# Patient Record
Sex: Male | Born: 1970 | Race: White | Hispanic: No | Marital: Married | Smoking: Current every day smoker
Health system: Southern US, Community
[De-identification: ages and names within clinical notes are randomized; demographics above are authoritative.]

## PROBLEM LIST (undated history)

## (undated) DIAGNOSIS — R112 Nausea with vomiting, unspecified: Secondary | ICD-10-CM

## (undated) DIAGNOSIS — Z9889 Other specified postprocedural states: Secondary | ICD-10-CM

## (undated) DIAGNOSIS — M62838 Other muscle spasm: Secondary | ICD-10-CM

## (undated) DIAGNOSIS — R200 Anesthesia of skin: Secondary | ICD-10-CM

## (undated) DIAGNOSIS — F419 Anxiety disorder, unspecified: Secondary | ICD-10-CM

## (undated) DIAGNOSIS — E785 Hyperlipidemia, unspecified: Secondary | ICD-10-CM

## (undated) HISTORY — PX: SPINE SURGERY: SHX786

## (undated) HISTORY — PX: HERNIA REPAIR: SHX51

## (undated) HISTORY — DX: Anxiety disorder, unspecified: F41.9

## (undated) HISTORY — PX: EYE SURGERY: SHX253

---

## 2002-04-14 ENCOUNTER — Encounter: Payer: Self-pay | Admitting: Family Medicine

## 2002-04-14 ENCOUNTER — Encounter: Admission: RE | Admit: 2002-04-14 | Discharge: 2002-04-14 | Payer: Self-pay | Admitting: Family Medicine

## 2004-02-11 ENCOUNTER — Ambulatory Visit (HOSPITAL_COMMUNITY): Admission: RE | Admit: 2004-02-11 | Discharge: 2004-02-11 | Payer: Self-pay | Admitting: General Surgery

## 2010-07-30 ENCOUNTER — Emergency Department (HOSPITAL_COMMUNITY): Admission: EM | Admit: 2010-07-30 | Discharge: 2010-07-30 | Payer: Self-pay | Admitting: Emergency Medicine

## 2010-10-08 ENCOUNTER — Encounter: Payer: Self-pay | Admitting: Family Medicine

## 2011-02-02 NOTE — Op Note (Signed)
NAME:  Tyler Craig, Tyler Craig                         ACCOUNT NO.:  [id number]   MEDICAL RECORD NO.:  [id number]                   PATIENT TYPE:  AMB   LOCATION:  DAY                                  FACILITY:  [facility]   PHYSICIAN:  Leonie Man, M.D.                DATE OF BIRTH:  [birth year]   DATE OF PROCEDURE:  02/11/2004  DATE OF DISCHARGE:                                 OPERATIVE REPORT   PREOPERATIVE DIAGNOSIS:  Bilateral inguinal hernias.   POSTOPERATIVE DIAGNOSIS:  Bilateral inguinal hernias.   PROCEDURE:  Repair of bilateral inguinal hernias.   SURGEON:  Leonie Man, M.D.   ASSISTANT:  Nurse.   ANESTHESIA:  General.   Tyler Craig is a 40 year old man presenting with bilateral groin bulges  which had been present and enlarging over the previous six weeks.  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  He comes to the operating room now for repair  of his hernias after the risks and potential benefits of surgery have been  fully discussed, all alternatives have been discussed.   PROCEDURE:  Following induction of satisfactory general anesthesia with the  patient positioned supinely, the lower groin was prepped and draped to be  included in the sterile operative field.  A transverse incision is made,  first in the right lower groin, increasing through the skin and subcutaneous  tissue, dissecting down to the external oblique aponeurosis.  This is opened  up to the external inguinal ring with protection of the ilioinguinal nerve,  which is retracted laterally and inferiorly.  The spermatic cord is elevated  and held with a Penrose drain.  Dissection along the spermatic cord revealed  a large direct inguinal hernia.  There was no indirect component associated.  The cord was retracted laterally, and the defect then repaired with a light  patch of polypropylene mesh, which was sewn in at the pubic tubercle with a  2-0 Novofil suture and carried up along the conjoint tendon to the internal  ring and again from the pubic tubercle along the shelving edge of Cooper's  ligament up to the internal ring.  The mesh was split to allow normal  protrusion of the cord.  The tails of the mesh were sutured into the  internal oblique muscle behind the cord.  The resulting new internal ring  allowed the cord to come through without any constriction; however, it was  not so loose as to cause new herniation.   The layers of the sacrum were then checked with hemostasis and noted to be  dry.  The external oblique aponeurosis was then closed over the cord with a  running 2-0 Vicryl suture, thereby reconstruction the external ring.  Scarpa's fascia was then closed with a running 3-0 Vicryl suture, and the  skin closed with running 4-0 Vicryl suture.   Attention  was then turned to the left side, where a symmetrically placed  incision was made on the lower groin crease.  This was taken through the  skin and subcutaneous tissue with dissection down to the external oblique  aponeurosis.  This was opened up through the external inguinal ring, again  with the protection of the ilioinguinal nerve.  The spermatic cord was then  elevated with a Penrose drain, and again, a moderately large direct inguinal  hernia was dissected free from the cord.  Upon dissection of the spermatic  cord, two lipomas were removed, but there was no evidence of any indirect  sac.  The direct defect was then repaired with an Omni patch of  polypropylene mesh on the pubic tubercle with a 2-0 Novofil suture, carried  up along the conjoint tendon with a running 2-0 Novofil suture up to the  internal ring and again from the pubic tubercle upon along the shelving edge  of Poupart's ligament to the internal ring.  The mesh was split so as to  allow normal protrusion of the cord, and the tails of the mesh were then  sutured into the internal oblique muscle  behind the cord, thereby  reconstruction a new internal ring.  The internal ring was tested.  It was  noted to be without constriction against the cord.  All the layers of  dissection again checked for hemostasis.  Sponge, instruments, and sharp  counts were verified.  The external oblique aponeurosis was closed over the  external ring with a running suture of 2-0 Vicryl.  Scarpa's fascia was then  closed with a running 3-0 Vicryl, and the skin was closed with a running 4-0  Monocryl suture.  Both wounds were then reinforced with Steri-Strips.  Sterile dressings were applied.  The anesthetic was reversed, and the  patient was moved from the operating room to the recovery room in stable  condition.  He tolerated the procedure well.                                               Leonie Man, M.D.    PB/MEDQ  D:  02/11/2004  T:  02/12/2004  Job:  [id number]   cc:   Clydie Braun L. Hal Hope, M.D.  [address] [place]  [place] [address]  Fax: [phone]

## 2013-12-07 ENCOUNTER — Ambulatory Visit: Payer: Self-pay | Admitting: Family Medicine

## 2013-12-29 ENCOUNTER — Ambulatory Visit (INDEPENDENT_AMBULATORY_CARE_PROVIDER_SITE_OTHER): Payer: BC Managed Care – PPO | Admitting: Family Medicine

## 2013-12-29 ENCOUNTER — Encounter: Payer: Self-pay | Admitting: Family Medicine

## 2013-12-29 VITALS — BP 138/76 | HR 88 | Temp 98.1°F | Resp 18 | Ht 70.0 in | Wt 181.0 lb

## 2013-12-29 DIAGNOSIS — F411 Generalized anxiety disorder: Secondary | ICD-10-CM

## 2013-12-29 DIAGNOSIS — F329 Major depressive disorder, single episode, unspecified: Secondary | ICD-10-CM

## 2013-12-29 DIAGNOSIS — F17200 Nicotine dependence, unspecified, uncomplicated: Secondary | ICD-10-CM

## 2013-12-29 MED ORDER — LORAZEPAM 0.5 MG PO TABS: 0.5000 mg | ORAL_TABLET | Freq: Two times a day (BID) | ORAL | Status: DC | PRN

## 2013-12-29 MED ORDER — FLUOXETINE HCL 10 MG PO CAPS: 10.0000 mg | ORAL_CAPSULE | Freq: Every day | ORAL | Status: DC

## 2013-12-29 NOTE — Progress Notes (Signed)
Patient ID: Tyler Craig, male   DOB: [birth year], 43 y.o.   MRN: [id number]   Subjective:    Patient ID: Tyler GlimpseAllen D Remedios, male    DOB: [birth year], 43 y.o.   MRN: [id number]  Patient presents for anxiety/ depression  patient here for medications for anxiety and depression. She has a long-standing history of depression and anxiety diagnosed around age 43. He was very reluctant to go on medications for a few years. Looking back in her chart we have seen him on and off of the past 10 years for anxiety and depression he has been on a few medications but never really sticks with him. Chart notes that he was on Paxil at one point Effexor, Wellbutrin, Lexapro and more recently Zoloft. He actually weaned himself off of Zoloft 100 mg about 2 years ago S. he has not been seen in almost 3 years. He did try using marijuana to help with his symptoms of depression and sadness but this did not help. He also states that he has difficulty staying asleep even though he can fall asleep. He was in his church group but felt like he was too much of an anteverted was not really active therefore he stopped going. He states that his wife has been noticing changes and it is also affecting his job as he has difficulty concentrating he is fatigued because he's not sleeping and he is always feeling down on himself. He does have moments of anxiety as well but feels like he cannot express himself. He's never been hospitalized for any mood disorder. He denies any suicidal ideations    Review Of Systems:  GEN- denies fatigue, fever, weight loss,weakness, recent illness HEENT- denies eye drainage, change in vision, nasal discharge, CVS- denies chest pain, palpitations RESP- denies SOB, cough, wheeze ABD- denies N/V, change in stools, abd pain GU- denies dysuria, hematuria, dribbling, incontinence MSK- denies joint pain, muscle aches, injury Neuro- denies headache, dizziness, syncope, seizure activity       Objective:    BP  138/76  Pulse 88  Temp(Src) 98.1 F (36.7 C) (Oral)  Resp 18  Ht 5\' 10"  (1.778 m)  Wt 181 lb (82.101 kg)  BMI 25.97 kg/m2 GEN- NAD, alert and oriented x3 HEENT- PERRL, EOMI, non injected sclera, pink conjunctiva, MMM, oropharynx clear CVS- RRR, no murmur RESP-CTAB Psych- depressed affect, poor eye contact, well groomed, no SI, no hallucinations, normal thought process EXT- No edema Pulses- Radial 2+        Assessment & Plan:      Problem List Items Addressed This Visit   None      Note: This dictation was prepared with Dragon dictation along with smaller phrase technology. Any transcriptional errors that result from this process are unintentional.

## 2013-12-29 NOTE — Patient Instructions (Signed)
Start prozac once a day in the morning Take ativan as needed for stress or anxiety Return in 4 weeks

## 2013-12-29 NOTE — Assessment & Plan Note (Signed)
I will start him on Prozac 10 mg I will use lorazepam 0.5 mg as a bridge to this medication he will return in 4 weeks. We discussed side effects of the medication the

## 2013-12-29 NOTE — Assessment & Plan Note (Signed)
Per above no red flags

## 2014-01-26 ENCOUNTER — Ambulatory Visit: Payer: BC Managed Care – PPO | Admitting: Family Medicine

## 2014-02-01 ENCOUNTER — Ambulatory Visit: Payer: BC Managed Care – PPO | Admitting: Family Medicine

## 2014-02-09 ENCOUNTER — Ambulatory Visit (INDEPENDENT_AMBULATORY_CARE_PROVIDER_SITE_OTHER): Payer: BC Managed Care – PPO | Admitting: Family Medicine

## 2014-02-09 ENCOUNTER — Encounter: Payer: Self-pay | Admitting: Family Medicine

## 2014-02-09 VITALS — BP 118/68 | HR 64 | Temp 98.1°F | Resp 16 | Ht 70.0 in | Wt 183.0 lb

## 2014-02-09 DIAGNOSIS — F329 Major depressive disorder, single episode, unspecified: Secondary | ICD-10-CM

## 2014-02-09 DIAGNOSIS — M25529 Pain in unspecified elbow: Secondary | ICD-10-CM

## 2014-02-09 DIAGNOSIS — F411 Generalized anxiety disorder: Secondary | ICD-10-CM

## 2014-02-09 LAB — CBC WITH DIFFERENTIAL/PLATELET
Basophils Absolute: 0 10*3/uL (ref 0.0–0.1)
Basophils Relative: 0 % (ref 0–1)
Eosinophils Absolute: 0.1 10*3/uL (ref 0.0–0.7)
Eosinophils Relative: 1 % (ref 0–5)
HCT: 39.2 % (ref 39.0–52.0)
Hemoglobin: 13.4 g/dL (ref 13.0–17.0)
Lymphocytes Relative: 27 % (ref 12–46)
Lymphs Abs: 2.4 10*3/uL (ref 0.7–4.0)
MCH: 31.1 pg (ref 26.0–34.0)
MCHC: 34.2 g/dL (ref 30.0–36.0)
MCV: 91 fL (ref 78.0–100.0)
Monocytes Absolute: 0.6 10*3/uL (ref 0.1–1.0)
Monocytes Relative: 7 % (ref 3–12)
Neutro Abs: 5.9 10*3/uL (ref 1.7–7.7)
Neutrophils Relative %: 65 % (ref 43–77)
Platelets: 255 10*3/uL (ref 150–400)
RBC: 4.31 MIL/uL (ref 4.22–5.81)
RDW: 15.2 % (ref 11.5–15.5)
WBC: 9 10*3/uL (ref 4.0–10.5)

## 2014-02-09 MED ORDER — FLUOXETINE HCL 20 MG PO CAPS
20.0000 mg | ORAL_CAPSULE | Freq: Every day | ORAL | Status: DC
Start: 2014-02-09 — End: 2014-07-01

## 2014-02-09 NOTE — Progress Notes (Signed)
Patient ID: Tyler Craig, male   DOB: [birth year], 43 y.o.   MRN: [id number]   Subjective:    Patient ID: Tyler Craig, male    DOB: [birth year], 43 y.o.   MRN: [id number]  Patient presents for 4 month F/U  patient here to followup medications and interim. He was seen about 6 weeks ago at that time he was started on Prozac 10 mg and lorazepam 0.5 mg as needed. He states that the Prozac helps a little but he does not see a big difference. Lorazepam helps calm him down however it makes him sleepy therefore he is only taking a couple of pills. His wife does not notice a big change in his mood.  He also states that he has chronic elbow pain he was seen by an urgent care in the past and was on some anti-inflammatories for pain in both elbows states that he may have some arthritis he was supposed to go to an orthopedist but he is been holding off on this. 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    Review Of Systems:  GEN- denies fatigue, fever, weight loss,weakness, recent illness HEENT- denies eye drainage, change in vision, nasal discharge, CVS- denies chest pain, palpitations RESP- denies SOB, cough, wheeze MSK- + joint pain, muscle aches, injury Neuro- denies headache, dizziness, syncope, seizure activity       Objective:    BP 118/68  Pulse 64  Temp(Src) 98.1 F (36.7 C) (Oral)  Resp 16  Ht 5\' 10"  (1.778 m)  Wt 183 lb (83.008 kg)  BMI 26.26 kg/m2 GEN- NAD, alert and oriented x3 Psych- flat affect, not anxious appearing, normal speech, no SI, no hallcuinations, in work Public relations account executive MSK- Bilat elbow inspection normal, good ROM       Assessment & Plan:      Problem List Items Addressed This Visit   MDD (major depressive disorder) - Primary   Relevant Medications       FLUoxetine (PROZAC) capsule   Other Relevant Orders      Comprehensive metabolic panel      CBC with Differential      TSH   GAD (generalized anxiety disorder)      Note: This dictation was prepared with Dragon dictation along with smaller phrase technology. Any transcriptional errors that result from this process are unintentional.

## 2014-02-09 NOTE — Patient Instructions (Signed)
Try the biofreeze for the elbow/ or Aspercreme for joints Increase prozac to 20mg  once a day  You can take 1/2 tablet of the ativan if needed F/U 2 months

## 2014-02-09 NOTE — Assessment & Plan Note (Signed)
Increase Prozac to 20 mg we also discussed that he can take half a tablet of the lorazepam if needed to decrease the somnolent affect Followup in 8 weeks

## 2014-02-09 NOTE — Assessment & Plan Note (Signed)
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 Advised him that he can try  Biofreeze or Aspercreme

## 2014-02-10 LAB — COMPREHENSIVE METABOLIC PANEL
ALT: 23 U/L (ref 0–53)
AST: 15 U/L (ref 0–37)
Albumin: 4.4 g/dL (ref 3.5–5.2)
Alkaline Phosphatase: 58 U/L (ref 39–117)
BUN: 11 mg/dL (ref 6–23)
CO2: 27 mEq/L (ref 19–32)
Calcium: 9.5 mg/dL (ref 8.4–10.5)
Chloride: 103 mEq/L (ref 96–112)
Creat: 0.94 mg/dL (ref 0.50–1.35)
Glucose, Bld: 75 mg/dL (ref 70–99)
Potassium: 4.6 mEq/L (ref 3.5–5.3)
Sodium: 139 mEq/L (ref 135–145)
Total Bilirubin: 0.2 mg/dL (ref 0.2–1.2)
Total Protein: 6.3 g/dL (ref 6.0–8.3)

## 2014-02-10 LAB — TSH: TSH: 0.975 u[IU]/mL (ref 0.350–4.500)

## 2014-04-07 ENCOUNTER — Ambulatory Visit: Payer: BC Managed Care – PPO | Admitting: Family Medicine

## 2014-07-01 ENCOUNTER — Other Ambulatory Visit: Payer: Self-pay | Admitting: *Deleted

## 2014-07-01 MED ORDER — FLUOXETINE HCL 20 MG PO CAPS: 20.0000 mg | ORAL_CAPSULE | Freq: Every day | ORAL | Status: DC

## 2014-07-01 NOTE — Telephone Encounter (Signed)
Received fax requesting refill on Prozac.   Refill appropriate and filled per protocol. 

## 2014-07-05 ENCOUNTER — Ambulatory Visit (INDEPENDENT_AMBULATORY_CARE_PROVIDER_SITE_OTHER): Payer: BC Managed Care – PPO | Admitting: Family Medicine

## 2014-07-05 ENCOUNTER — Encounter: Payer: Self-pay | Admitting: Family Medicine

## 2014-07-05 VITALS — BP 118/62 | HR 72 | Temp 98.6°F | Resp 14 | Ht 69.0 in | Wt 185.0 lb

## 2014-07-05 DIAGNOSIS — F411 Generalized anxiety disorder: Secondary | ICD-10-CM

## 2014-07-05 DIAGNOSIS — G4700 Insomnia, unspecified: Secondary | ICD-10-CM

## 2014-07-05 DIAGNOSIS — F331 Major depressive disorder, recurrent, moderate: Secondary | ICD-10-CM

## 2014-07-05 DIAGNOSIS — Z23 Encounter for immunization: Secondary | ICD-10-CM

## 2014-07-05 MED ORDER — FLUOXETINE HCL 40 MG PO CAPS: 40.0000 mg | ORAL_CAPSULE | Freq: Every day | ORAL | Status: DC

## 2014-07-05 MED ORDER — LORAZEPAM 0.5 MG PO TABS: 0.5000 mg | ORAL_TABLET | Freq: Two times a day (BID) | ORAL | Status: DC | PRN

## 2014-07-05 NOTE — Assessment & Plan Note (Signed)
prozac increased to 40mg  Regarding sleep, we will use ativan at bedtime as needed, instead of adding new medication I think this is worse in setting of father recent diagnosis and treatments No red flags on exam

## 2014-07-05 NOTE — Progress Notes (Signed)
Patient ID: Tyler Craig, male   DOB: 1971/04/02, 43 y.o.   MRN: [id number]   Subjective:    Patient ID: Tyler Craig, male    DOB: 1971/04/02, 43 y.o.   MRN: [id number]  Patient presents for Medication Refill  patient here for medication refills. He is history of major depressive disorder as well as anxiety he's also had some worsening insomnia recently as his father is going to treatments for what sounds like colon cancer. He thinks that the Prozac is helping he is at 20 mg but he continues to have some days with a lot of stress and anxiety his wife does notice a difference with his medications. The only side effect he notices that his sex drive has decreased but he does not want to stop the medication. He thinks that he'll benefit from it being a little bit stronger. He does not take the lorazepam at this time.    Review Of Systems:  GEN- denies fatigue, fever, weight loss,weakness, recent illness HEENT- denies eye drainage, change in vision, nasal discharge, CVS- denies chest pain, palpitations RESP- denies SOB, cough, wheeze ABD- denies N/V, change in stools, abd pain GU- denies dysuria, hematuria, dribbling, incontinence MSK- denies joint pain, muscle aches, injury Neuro- denies headache, dizziness, syncope, seizure activity       Objective:    BP 118/62  Pulse 72  Temp(Src) 98.6 F (37 C) (Oral)  Resp 14  Ht 5\' 9"  (1.753 m)  Wt 185 lb (83.915 kg)  BMI 27.31 kg/m2 GEN- NAD, alert and oriented x3 Psych- normal affect and mood, no hallucinations, well groomed, no SI        Assessment & Plan:      Problem List Items Addressed This Visit   MDD (major depressive disorder)   Relevant Medications      FLUoxetine (PROZAC) capsule      LORazepam (ATIVAN) tablet   Insomnia   GAD (generalized anxiety disorder)    Other Visit Diagnoses   Need for prophylactic vaccination and inoculation against influenza    -  Primary    Relevant Orders       Flu Vaccine QUAD 36+  mos PF IM (Fluarix Quad PF) (Completed)       Note: This dictation was prepared with Dragon dictation along with smaller phrase technology. Any transcriptional errors that result from this process are unintentional.

## 2014-07-05 NOTE — Patient Instructions (Addendum)
Prozac increased to 40mg   Take ativan at bedtime as needed  Flu shot given F/U 3 months- PHYSICAL

## 2014-08-11 ENCOUNTER — Encounter: Payer: Self-pay | Admitting: Family Medicine

## 2014-10-05 ENCOUNTER — Encounter: Payer: BC Managed Care – PPO | Admitting: Family Medicine

## 2014-10-12 ENCOUNTER — Encounter: Payer: BC Managed Care – PPO | Admitting: Family Medicine

## 2014-10-27 ENCOUNTER — Other Ambulatory Visit: Payer: BLUE CROSS/BLUE SHIELD

## 2014-10-27 DIAGNOSIS — Z79899 Other long term (current) drug therapy: Secondary | ICD-10-CM

## 2014-10-27 DIAGNOSIS — Z0000 Encounter for general adult medical examination without abnormal findings: Secondary | ICD-10-CM

## 2014-10-27 DIAGNOSIS — F320 Major depressive disorder, single episode, mild: Secondary | ICD-10-CM

## 2014-10-27 DIAGNOSIS — F411 Generalized anxiety disorder: Secondary | ICD-10-CM

## 2014-10-27 LAB — CBC WITH DIFFERENTIAL/PLATELET
Basophils Absolute: 0 10*3/uL (ref 0.0–0.1)
Basophils Relative: 0 % (ref 0–1)
Eosinophils Absolute: 0.2 10*3/uL (ref 0.0–0.7)
Eosinophils Relative: 3 % (ref 0–5)
HCT: 43 % (ref 39.0–52.0)
HEMOGLOBIN: 14.6 g/dL (ref 13.0–17.0)
LYMPHS PCT: 25 % (ref 12–46)
Lymphs Abs: 1.6 10*3/uL (ref 0.7–4.0)
MCH: 31.3 pg (ref 26.0–34.0)
MCHC: 34 g/dL (ref 30.0–36.0)
MCV: 92.1 fL (ref 78.0–100.0)
MPV: 10.1 fL (ref 8.6–12.4)
Monocytes Absolute: 0.5 10*3/uL (ref 0.1–1.0)
Monocytes Relative: 8 % (ref 3–12)
NEUTROS ABS: 4 10*3/uL (ref 1.7–7.7)
Neutrophils Relative %: 64 % (ref 43–77)
PLATELETS: 285 10*3/uL (ref 150–400)
RBC: 4.67 MIL/uL (ref 4.22–5.81)
RDW: 15 % (ref 11.5–15.5)
WBC: 6.3 10*3/uL (ref 4.0–10.5)

## 2014-10-27 LAB — LIPID PANEL
Cholesterol: 236 mg/dL — ABNORMAL HIGH (ref 0–200)
HDL: 39 mg/dL — AB (ref >39)
LDL CALC: 174 mg/dL — AB (ref 0–99)
Total CHOL/HDL Ratio: 6.1 Ratio
Triglycerides: 117 mg/dL (ref <150)
VLDL: 23 mg/dL (ref 0–40)

## 2014-10-27 LAB — COMPLETE METABOLIC PANEL WITH GFR
ALBUMIN: 4 g/dL (ref 3.5–5.2)
ALT: 13 U/L (ref 0–53)
AST: 12 U/L (ref 0–37)
Alkaline Phosphatase: 60 U/L (ref 39–117)
BUN: 11 mg/dL (ref 6–23)
CALCIUM: 9.4 mg/dL (ref 8.4–10.5)
CO2: 25 mEq/L (ref 19–32)
Chloride: 105 mEq/L (ref 96–112)
Creat: 0.9 mg/dL (ref 0.50–1.35)
Glucose, Bld: 98 mg/dL (ref 70–99)
POTASSIUM: 4.7 meq/L (ref 3.5–5.3)
SODIUM: 140 meq/L (ref 135–145)
TOTAL PROTEIN: 6.1 g/dL (ref 6.0–8.3)
Total Bilirubin: 0.4 mg/dL (ref 0.2–1.2)

## 2014-10-27 LAB — TSH: TSH: 0.409 u[IU]/mL (ref 0.350–4.500)

## 2014-11-01 ENCOUNTER — Encounter: Payer: Self-pay | Admitting: Family Medicine

## 2014-11-16 ENCOUNTER — Encounter: Payer: Self-pay | Admitting: Family Medicine

## 2014-11-16 ENCOUNTER — Ambulatory Visit (INDEPENDENT_AMBULATORY_CARE_PROVIDER_SITE_OTHER): Payer: BLUE CROSS/BLUE SHIELD | Admitting: Family Medicine

## 2014-11-16 VITALS — BP 136/70 | HR 78 | Temp 98.8°F | Resp 14 | Ht 69.0 in | Wt 178.0 lb

## 2014-11-16 DIAGNOSIS — Z0000 Encounter for general adult medical examination without abnormal findings: Secondary | ICD-10-CM | POA: Diagnosis not present

## 2014-11-16 DIAGNOSIS — F331 Major depressive disorder, recurrent, moderate: Secondary | ICD-10-CM

## 2014-11-16 DIAGNOSIS — Z23 Encounter for immunization: Secondary | ICD-10-CM | POA: Diagnosis not present

## 2014-11-16 DIAGNOSIS — Z720 Tobacco use: Secondary | ICD-10-CM | POA: Diagnosis not present

## 2014-11-16 DIAGNOSIS — F411 Generalized anxiety disorder: Secondary | ICD-10-CM | POA: Diagnosis not present

## 2014-11-16 DIAGNOSIS — E785 Hyperlipidemia, unspecified: Secondary | ICD-10-CM | POA: Insufficient documentation

## 2014-11-16 DIAGNOSIS — F17200 Nicotine dependence, unspecified, uncomplicated: Secondary | ICD-10-CM

## 2014-11-16 MED ORDER — LORAZEPAM 0.5 MG PO TABS: 0.5000 mg | ORAL_TABLET | Freq: Two times a day (BID) | ORAL | Status: DC | PRN

## 2014-11-16 MED ORDER — FLUOXETINE HCL 40 MG PO CAPS: 40.0000 mg | ORAL_CAPSULE | Freq: Every day | ORAL | Status: DC

## 2014-11-16 NOTE — Assessment & Plan Note (Signed)
Continue current meds, stable at current dose

## 2014-11-16 NOTE — Progress Notes (Signed)
Patient ID: Adela Glimpsellen D Blackerby, male   DOB: 07/07/1971, 44 y.o.   MRN: [id number]   Subjective:    Patient ID: Adela GlimpseAllen D Panos, male    DOB: 07/07/1971, 44 y.o.   MRN: [id number]  Patient presents for CPE  patient here for complete physical exam. He has no specific concerns. He is taking his medication as prescribed. His fasting labs were reviewed and were remarkable for elevated cholesterol. There is some heart disease in his family. He is due for tetanus booster.    Review Of Systems:  GEN- denies fatigue, fever, weight loss,weakness, recent illness HEENT- denies eye drainage, change in vision, nasal discharge, CVS- denies chest pain, palpitations RESP- denies SOB, cough, wheeze ABD- denies N/V, change in stools, abd pain GU- denies dysuria, hematuria, dribbling, incontinence MSK- denies joint pain, muscle aches, injury Neuro- denies headache, dizziness, syncope, seizure activity       Objective:    BP 136/70 mmHg  Pulse 78  Temp(Src) 98.8 F (37.1 C) (Oral)  Resp 14  Ht 5\' 9"  (1.753 m)  Wt 178 lb (80.74 kg)  BMI 26.27 kg/m2 GEN- NAD, alert and oriented x3 HEENT- PERRL, EOMI, non injected sclera, pink conjunctiva, MMM, oropharynx clear, TM clear bilat, canals clear Neck- Supple, no LAD CVS- RRR, no murmur RESP-CTAB ABD-NABS,soft,NT,ND Psych- normal affect and mood EXT- No edema Pulses- Radial, DP- 2+        Assessment & Plan:      Problem List Items Addressed This Visit    None    Visit Diagnoses    Routine general medical examination at a health care facility    -  Primary    CPE Done, discussed cholesterol and dietary changes recheck in 12 weeks before starting meds, TDAP given    Need for prophylactic vaccination with combined diphtheria-tetanus-pertussis (DTP) vaccine        Relevant Orders    Tdap vaccine greater than or equal to 7yo IM (Completed)       Note: This dictation was prepared with Dragon dictation along with smaller phrase technology. Any  transcriptional errors that result from this process are unintentional.

## 2014-11-16 NOTE — Patient Instructions (Signed)
Work on low cholesterol diet  Come in 3 months for cholesterol  Tetanus Booster F/U 6 months for visit

## 2015-01-07 ENCOUNTER — Encounter: Payer: Self-pay | Admitting: Family Medicine

## 2015-01-07 ENCOUNTER — Ambulatory Visit (INDEPENDENT_AMBULATORY_CARE_PROVIDER_SITE_OTHER): Payer: BLUE CROSS/BLUE SHIELD | Admitting: Family Medicine

## 2015-01-07 VITALS — BP 110/62 | HR 78 | Temp 98.4°F | Resp 14 | Ht 69.0 in | Wt 176.0 lb

## 2015-01-07 DIAGNOSIS — S335XXA Sprain of ligaments of lumbar spine, initial encounter: Secondary | ICD-10-CM

## 2015-01-07 MED ORDER — CYCLOBENZAPRINE HCL 10 MG PO TABS: 10.0000 mg | ORAL_TABLET | Freq: Three times a day (TID) | ORAL | Status: DC | PRN

## 2015-01-07 MED ORDER — METHYLPREDNISOLONE 4 MG PO TBPK: ORAL_TABLET | ORAL | Status: DC

## 2015-01-07 MED ORDER — HYDROCODONE-ACETAMINOPHEN 5-325 MG PO TABS: 1.0000 | ORAL_TABLET | Freq: Four times a day (QID) | ORAL | Status: DC | PRN

## 2015-01-07 MED ORDER — METHYLPREDNISOLONE ACETATE 80 MG/ML IJ SUSP
80.0000 mg | Freq: Once | INTRAMUSCULAR | Status: AC
  Administered 2015-01-07: 80 mg via INTRAMUSCULAR

## 2015-01-07 NOTE — Patient Instructions (Signed)
Steroid shot given Start depomedrol package tomorrow for inflammation Do not take any over the counter anti-inflammatories with this Muscle relaxer given Pain medication for bedtime Call if not improved F/U as previous

## 2015-01-09 NOTE — Progress Notes (Signed)
Patient ID: Tyler Craig, male   DOB: Aug 30, 1971, 44 y.o.   MRN: [id number]   Subjective:    Patient ID: Tyler Craig, male    DOB: Aug 30, 1971, 44 y.o.   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 He finished his shift but the next day he was very stiff with pain throughout his lower back most in center would radiate around both sides of his back but not down his legs, no change in bowel or bladder. Denies tingling or numbness in lower ext He has taken BC powders and DOMS back pain relief    Review Of Systems:  GEN- denies fatigue, fever, weight loss,weakness, recent illness HEENT- denies eye drainage, change in vision, nasal discharge, CVS- denies chest pain, palpitations RESP- denies SOB, cough, wheeze ABD- denies N/V, change in stools, abd pain GU- denies dysuria, hematuria, dribbling, incontinence MSK-+ joint pain, muscle aches, injury Neuro- denies headache, dizziness, syncope, seizure activity       Objective:    BP 110/62 mmHg  Pulse 78  Temp(Src) 98.4 F (36.9 C) (Oral)  Resp 14  Ht 5\' 9"  (1.753 m)  Wt 176 lb (79.833 kg)  BMI 25.98 kg/m2 GEN- NAD, alert and oriented x3 Neck- Supple, FROM MSK- TTP bilat lumbar region, neg SLR, fair ROM spine, +spasm, good ROM HIPS, KNEES Neuro- Strength equal bilat LE, sensation in tact, DTR symmetric, antalgic gait EXT- No edema         Assessment & Plan:      Problem List Items Addressed This Visit    None    Visit Diagnoses    Lumbar back sprain, initial encounter    -  Primary    Treat at MSK strain based on mechanism of injury, muscle relaxer, medrol dose pak, hydrocodone, heat     Relevant Medications    methylPREDNISolone acetate (DEPO-MEDROL) injection 80 mg (Completed)       Note: This dictation was  prepared with Dragon dictation along with smaller phrase technology. Any transcriptional errors that result from this process are unintentional.

## 2015-05-20 ENCOUNTER — Ambulatory Visit: Payer: BLUE CROSS/BLUE SHIELD | Admitting: Family Medicine

## 2015-06-01 ENCOUNTER — Ambulatory Visit (INDEPENDENT_AMBULATORY_CARE_PROVIDER_SITE_OTHER): Payer: BLUE CROSS/BLUE SHIELD | Admitting: Family Medicine

## 2015-06-01 ENCOUNTER — Encounter: Payer: Self-pay | Admitting: Family Medicine

## 2015-06-01 VITALS — BP 116/60 | HR 64 | Temp 98.4°F | Resp 16 | Ht 69.0 in | Wt 181.0 lb

## 2015-06-01 DIAGNOSIS — Z23 Encounter for immunization: Secondary | ICD-10-CM | POA: Diagnosis not present

## 2015-06-01 DIAGNOSIS — R5382 Chronic fatigue, unspecified: Secondary | ICD-10-CM | POA: Diagnosis not present

## 2015-06-01 DIAGNOSIS — Z720 Tobacco use: Secondary | ICD-10-CM | POA: Diagnosis not present

## 2015-06-01 DIAGNOSIS — F331 Major depressive disorder, recurrent, moderate: Secondary | ICD-10-CM | POA: Diagnosis not present

## 2015-06-01 DIAGNOSIS — F411 Generalized anxiety disorder: Secondary | ICD-10-CM | POA: Diagnosis not present

## 2015-06-01 DIAGNOSIS — E785 Hyperlipidemia, unspecified: Secondary | ICD-10-CM

## 2015-06-01 DIAGNOSIS — F17200 Nicotine dependence, unspecified, uncomplicated: Secondary | ICD-10-CM

## 2015-06-01 NOTE — Assessment & Plan Note (Signed)
He will return for fasting labs in the morning

## 2015-06-01 NOTE — Assessment & Plan Note (Signed)
If his labs are normal I plan to add Wellbutrin extended release to his Prozac in the morning to see if this helps with his mood and his energy level. I discussed this with him.

## 2015-06-01 NOTE — Progress Notes (Signed)
Patient ID: Tyler Craig, male   DOB: Jun 25, 1971, 44 y.o.   MRN: [id number]   Subjective:    Patient ID: Tyler Craig, male    DOB: 08-03-1971, 44 y.o.   MRN: [id number]  Patient presents for 6 month F/U and Fatigue    Pt here to f/u medications, he states he is doing well on the prozac but still has some low days where he feels depressed. He also continues to have problems with sleep. He has not used the ativan in months. He is always tired and his wife is concerned about this. He denies any ED. Normal TSH , cmet, cbc in past.  Would like to be checked for other deficiencies that may cause fatigue       Review Of Systems:  GEN- +fatigue, fever, weight loss,weakness, recent illness HEENT- denies eye drainage, change in vision, nasal discharge, CVS- denies chest pain, palpitations RESP- denies SOB, cough, wheeze ABD- denies N/V, change in stools, abd pain GU- denies dysuria, hematuria, dribbling, incontinence MSK- denies joint pain, muscle aches, injury Neuro- denies headache, dizziness, syncope, seizure activity       Objective:    BP 116/60 mmHg  Pulse 64  Temp(Src) 98.4 F (36.9 C) (Oral)  Resp 16  Ht  (1.753 m)  Wt 181 lb (82.101 kg)  BMI 26.72 kg/m2 GEN- NAD, alert and oriented x3 HEENT- PERRL, EOMI, non injected sclera, pink conjunctiva, MMM, oropharynx clear Neck- Supple, no thyromegaly CVS- RRR, no murmur RESP-CTAB Psych- quiet, normal affect and mood, good eye contact, well groomed  Pulses- Radial 2+        Assessment & Plan:      Problem List Items Addressed This Visit    Smoker   MDD (major depressive disorder)    If his labs are normal I plan to add Wellbutrin extended release to his Prozac in the morning to see if this helps with his mood and his energy level. I discussed this with him.      Hyperlipidemia    He will return for fasting labs in the morning      GAD (generalized anxiety disorder)    Other Visit Diagnoses    Need for  prophylactic vaccination and inoculation against influenza    -  Primary    Relevant Orders    Flu Vaccine QUAD 36+ mos PF IM (Fluarix & Fluzone Quad PF) (Completed)    Chronic fatigue        Most likley related to his depression and anxiety, will check B12, Vitamin D, testosterone with his labs,        Note: This dictation was prepared with Dragon dictation along with smaller phrase technology. Any transcriptional errors that result from this process are unintentional.

## 2015-06-01 NOTE — Patient Instructions (Signed)
COntinue the prozac Return for labs in the morning fasting We will consider Wellbutrin in the morning  F/U 6 months for physical

## 2015-06-02 ENCOUNTER — Other Ambulatory Visit: Payer: BLUE CROSS/BLUE SHIELD

## 2015-06-02 ENCOUNTER — Other Ambulatory Visit: Payer: Self-pay | Admitting: Family Medicine

## 2015-06-02 DIAGNOSIS — E785 Hyperlipidemia, unspecified: Secondary | ICD-10-CM

## 2015-06-02 DIAGNOSIS — R5382 Chronic fatigue, unspecified: Secondary | ICD-10-CM

## 2015-06-02 LAB — CBC WITH DIFFERENTIAL/PLATELET
BASOS ABS: 0 10*3/uL (ref 0.0–0.1)
BASOS PCT: 0 % (ref 0–1)
EOS ABS: 0.2 10*3/uL (ref 0.0–0.7)
Eosinophils Relative: 2 % (ref 0–5)
HCT: 42 % (ref 39.0–52.0)
Hemoglobin: 14 g/dL (ref 13.0–17.0)
Lymphocytes Relative: 24 % (ref 12–46)
Lymphs Abs: 1.9 10*3/uL (ref 0.7–4.0)
MCH: 31.7 pg (ref 26.0–34.0)
MCHC: 33.3 g/dL (ref 30.0–36.0)
MCV: 95 fL (ref 78.0–100.0)
MPV: 9.7 fL (ref 8.6–12.4)
Monocytes Absolute: 0.5 10*3/uL (ref 0.1–1.0)
Monocytes Relative: 6 % (ref 3–12)
NEUTROS PCT: 68 % (ref 43–77)
Neutro Abs: 5.4 10*3/uL (ref 1.7–7.7)
Platelets: 332 10*3/uL (ref 150–400)
RBC: 4.42 MIL/uL (ref 4.22–5.81)
RDW: 14.6 % (ref 11.5–15.5)
WBC: 8 10*3/uL (ref 4.0–10.5)

## 2015-06-02 LAB — COMPREHENSIVE METABOLIC PANEL
ALK PHOS: 68 U/L (ref 40–115)
ALT: 15 U/L (ref 9–46)
AST: 12 U/L (ref 10–40)
Albumin: 4 g/dL (ref 3.6–5.1)
BILIRUBIN TOTAL: 0.3 mg/dL (ref 0.2–1.2)
BUN: 14 mg/dL (ref 7–25)
CO2: 24 mmol/L (ref 20–31)
CREATININE: 0.85 mg/dL (ref 0.60–1.35)
Calcium: 9.3 mg/dL (ref 8.6–10.3)
Chloride: 106 mmol/L (ref 98–110)
GLUCOSE: 130 mg/dL — AB (ref 70–99)
POTASSIUM: 5 mmol/L (ref 3.5–5.3)
SODIUM: 141 mmol/L (ref 135–146)
TOTAL PROTEIN: 6.4 g/dL (ref 6.1–8.1)

## 2015-06-02 LAB — LIPID PANEL
CHOL/HDL RATIO: 7.8 ratio — AB (ref <=5.0)
Cholesterol: 257 mg/dL — ABNORMAL HIGH (ref 125–200)
HDL: 33 mg/dL — ABNORMAL LOW (ref >=40)
LDL Cholesterol: 204 mg/dL — ABNORMAL HIGH (ref <130)
Triglycerides: 100 mg/dL (ref <150)
VLDL: 20 mg/dL (ref <30)

## 2015-06-03 LAB — TESTOSTERONE: TESTOSTERONE: 312 ng/dL (ref 300–890)

## 2015-06-03 LAB — HEMOGLOBIN A1C
HEMOGLOBIN A1C: 5.9 % — AB (ref <5.7)
MEAN PLASMA GLUCOSE: 123 mg/dL — AB (ref <117)

## 2015-06-03 LAB — VITAMIN B12: VITAMIN B 12: 842 pg/mL (ref 211–911)

## 2015-06-03 LAB — VITAMIN D 25 HYDROXY (VIT D DEFICIENCY, FRACTURES): VIT D 25 HYDROXY: 28 ng/mL — AB (ref 30–100)

## 2015-06-07 ENCOUNTER — Other Ambulatory Visit: Payer: Self-pay | Admitting: *Deleted

## 2015-06-07 MED ORDER — BUPROPION HCL ER (XL) 150 MG PO TB24: 150.0000 mg | ORAL_TABLET | Freq: Every day | ORAL | Status: DC

## 2015-06-07 MED ORDER — ATORVASTATIN CALCIUM 20 MG PO TABS: 20.0000 mg | ORAL_TABLET | Freq: Every day | ORAL | Status: DC

## 2015-08-04 ENCOUNTER — Other Ambulatory Visit: Payer: Self-pay | Admitting: *Deleted

## 2015-08-04 MED ORDER — FLUOXETINE HCL 40 MG PO CAPS: 40.0000 mg | ORAL_CAPSULE | Freq: Every day | ORAL | Status: DC

## 2015-08-04 NOTE — Telephone Encounter (Signed)
Received fax requesting refill on Prozac.   Refill appropriate and filled per protocol. 

## 2015-12-20 ENCOUNTER — Encounter: Payer: Self-pay | Admitting: Family Medicine

## 2015-12-20 ENCOUNTER — Ambulatory Visit (INDEPENDENT_AMBULATORY_CARE_PROVIDER_SITE_OTHER): Payer: BLUE CROSS/BLUE SHIELD | Admitting: Family Medicine

## 2015-12-20 VITALS — BP 134/76 | HR 82 | Temp 98.9°F | Resp 16 | Ht 69.0 in | Wt 180.0 lb

## 2015-12-20 DIAGNOSIS — M5010 Cervical disc disorder with radiculopathy, unspecified cervical region: Secondary | ICD-10-CM

## 2015-12-20 DIAGNOSIS — E785 Hyperlipidemia, unspecified: Secondary | ICD-10-CM

## 2015-12-20 DIAGNOSIS — M75101 Unspecified rotator cuff tear or rupture of right shoulder, not specified as traumatic: Secondary | ICD-10-CM | POA: Diagnosis not present

## 2015-12-20 LAB — COMPREHENSIVE METABOLIC PANEL
ALK PHOS: 64 U/L (ref 40–115)
ALT: 18 U/L (ref 9–46)
AST: 15 U/L (ref 10–40)
Albumin: 4.5 g/dL (ref 3.6–5.1)
BILIRUBIN TOTAL: 0.4 mg/dL (ref 0.2–1.2)
BUN: 13 mg/dL (ref 7–25)
CALCIUM: 9.4 mg/dL (ref 8.6–10.3)
CO2: 24 mmol/L (ref 20–31)
Chloride: 104 mmol/L (ref 98–110)
Creat: 0.86 mg/dL (ref 0.60–1.35)
GLUCOSE: 81 mg/dL (ref 70–99)
Potassium: 4.3 mmol/L (ref 3.5–5.3)
Sodium: 139 mmol/L (ref 135–146)
Total Protein: 6.8 g/dL (ref 6.1–8.1)

## 2015-12-20 LAB — LIPID PANEL
Cholesterol: 197 mg/dL (ref 125–200)
HDL: 43 mg/dL (ref >=40)
LDL Cholesterol: 123 mg/dL (ref <130)
Total CHOL/HDL Ratio: 4.6 Ratio (ref <=5.0)
Triglycerides: 157 mg/dL — ABNORMAL HIGH (ref <150)
VLDL: 31 mg/dL — ABNORMAL HIGH (ref <30)

## 2015-12-20 MED ORDER — OXYCODONE-ACETAMINOPHEN 5-325 MG PO TABS: 1.0000 | ORAL_TABLET | ORAL | Status: DC | PRN

## 2015-12-20 MED ORDER — CYCLOBENZAPRINE HCL 10 MG PO TABS: 10.0000 mg | ORAL_TABLET | Freq: Three times a day (TID) | ORAL | Status: DC | PRN

## 2015-12-20 MED ORDER — METHYLPREDNISOLONE ACETATE 80 MG/ML IJ SUSP
80.0000 mg | Freq: Once | INTRAMUSCULAR | Status: AC
  Administered 2015-12-20: 80 mg via INTRAMUSCULAR

## 2015-12-20 MED ORDER — METHYLPREDNISOLONE 4 MG PO TBPK: ORAL_TABLET | ORAL | Status: DC

## 2015-12-20 NOTE — Progress Notes (Signed)
Patient ID: Tyler Craig, male   DOB: 05-23-1971, 45 y.o.   MRN: [id number]   Subjective:    Patient ID: Tyler Craig, male    DOB: 05-23-1971, 45 y.o.   MRN: [id number]  Patient presents for Back Pain Patient here for neck pain he also has chronic back pain. He was actually reaching above head twisting off football with a ranch about a week ago when he did have significant pain afterwards. He had numbness and tingling was running down his left arm into his fingers. He still has numbness in his second through fourth digits. He also feels a little weak on that side. He is chronically right shoulder pain which he states he is now getting pains that come from his neck as well. He has history of remote cervical surgery where he had a plate placed to fix some disc. 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 He also continues to have the chronic back pain he was seen for a few months ago on and off. This area did help with the inflammation and the tingling at the last visit he would like to try this again. He is trying to avoid any further surgical intervention. He has taken over-the-counter medications with no improvement    Review Of Systems:  GEN- denies fatigue, fever, weight loss,weakness, recent illness HEENT- denies eye drainage, change in vision, nasal discharge, CVS- denies chest pain, palpitations RESP- denies SOB, cough, wheeze ABD- denies N/V, change in stools, abd pain GU- denies dysuria, hematuria, dribbling, incontinence MSK- +joint pain, muscle aches, injury Neuro- denies headache, dizziness, syncope, seizure activity       Objective:    BP 134/76 mmHg  Pulse 82  Temp(Src) 98.9 F (37.2 C) (Oral)  Resp 16  Ht 5\' 9"  (1.753 m)  Wt 180 lb (81.647 kg)  BMI 26.57 kg/m2 GEN- NAD, alert and oriented x3 Neck- fair ROM, TTP cervical spine, neg spurlings no spasm noted MSK- Normal inspection ,fair ROM of UE, +empty can right side,   Neuro- DTR symmetric, normal tone, decreased sensation Left hand 2-4th digits and palm  Motor 4+/5 RUE compared to left        Assessment & Plan:      Problem List Items Addressed This Visit    Hyperlipidemia - Primary   Relevant Orders   Comprehensive metabolic panel   Lipid panel    Other Visit Diagnoses    Rotator cuff syndrome, right        Decreased ROM, chronic injury, needs imaging he wants to hold off, it is becoming difficult to do his job    Cervical disc disorder with radiculopathy of cervical region        concern for disc disease with nerve impingment, with mechanism of injury, given Depo Medrol, steroid dose pak, percocet, also given flexeril for his low back pain  I think he needs  MRI of cervical spine he wants to try to hold off at this time.     Relevant Medications    methylPREDNISolone acetate (DEPO-MEDROL) injection 80 mg (Completed)       Note: This dictation was prepared with Dragon dictation along with smaller phrase technology. Any transcriptional errors that result from this process are unintentional.

## 2015-12-20 NOTE — Patient Instructions (Signed)
We will follow up next week, to see if want to proceed with MRI  Take prednisone pills Pain meds Muscle relaxer Continue all other meds F/U 6 months for PHYSICAL

## 2015-12-27 ENCOUNTER — Encounter: Payer: Self-pay | Admitting: *Deleted

## 2016-01-23 ENCOUNTER — Other Ambulatory Visit: Payer: Self-pay | Admitting: *Deleted

## 2016-01-23 MED ORDER — ATORVASTATIN CALCIUM 20 MG PO TABS: 20.0000 mg | ORAL_TABLET | Freq: Every day | ORAL | Status: DC

## 2016-01-23 NOTE — Telephone Encounter (Signed)
Received fax requesting refill on Lipitor.   Refill appropriate and filled per protocol. 

## 2016-04-17 ENCOUNTER — Other Ambulatory Visit: Payer: Self-pay | Admitting: Family Medicine

## 2016-04-18 NOTE — Telephone Encounter (Signed)
Refill appropriate and filled per protocol. 

## 2016-06-20 ENCOUNTER — Encounter: Payer: BLUE CROSS/BLUE SHIELD | Admitting: Family Medicine

## 2016-08-03 ENCOUNTER — Ambulatory Visit (INDEPENDENT_AMBULATORY_CARE_PROVIDER_SITE_OTHER): Payer: BLUE CROSS/BLUE SHIELD | Admitting: Osteopathic Medicine

## 2016-08-03 VITALS — BP 134/76 | HR 86 | Temp 99.0°F | Resp 16 | Ht 69.5 in | Wt 182.4 lb

## 2016-08-03 DIAGNOSIS — H8110 Benign paroxysmal vertigo, unspecified ear: Secondary | ICD-10-CM | POA: Diagnosis not present

## 2016-08-03 MED ORDER — MECLIZINE HCL 25 MG PO TABS: 25.0000 mg | ORAL_TABLET | Freq: Three times a day (TID) | ORAL | 1 refills | Status: DC | PRN

## 2016-08-03 NOTE — Progress Notes (Signed)
HPI: Tyler Craig is a 45 y.o. male  who presents to [facility] today, 08/03/16,  for chief complaint of:  Chief Complaint  Patient presents with  . Dizziness    x 4 days  . Shoulder Pain    x last 3 weeks are the worse    Dizziness . Context: no injury . Quality: room spinning . Duration: 4 days or so  . Timing: intermittent, worse with movement of head  . Assoc signs/symptoms: no palpitations or CP, no SOB, no fall, no VC or LOC    Past medical, surgical, social and family history reviewed: Past Medical History:  Diagnosis Date  . Anxiety    Past Surgical History:  Procedure Laterality Date  . SPINE SURGERY     neck surgery- has plate   Social History  Substance Use Topics  . Smoking status: Current Every Day Smoker    Packs/day: 1.50    Years: 20.00    Types: Cigarettes  . Smokeless tobacco: Never Used  . Alcohol use No   Family History  Problem Relation Age of Onset  . Hypertension Mother   . Cancer Father 21    colon cancer  . Heart disease Maternal Uncle 48    massive MI  . Heart disease Maternal Grandfather      Current medication list and allergy/intolerance information reviewed:   Current Outpatient Prescriptions  Medication Sig Dispense Refill  . atorvastatin (LIPITOR) 20 MG tablet Take 1 tablet (20 mg total) by mouth daily. 90 tablet 1  . cyclobenzaprine (FLEXERIL) 10 MG tablet Take 1 tablet (10 mg total) by mouth 3 (three) times daily as needed for muscle spasms. 30 tablet 2  . FLUoxetine (PROZAC) 40 MG capsule take 1 capsule by mouth once daily 30 capsule 6  . buPROPion (WELLBUTRIN XL) 150 MG 24 hr tablet Take 1 tablet (150 mg total) by mouth daily. (Patient not taking: Reported on 08/03/2016) 30 tablet 3  . LORazepam (ATIVAN) 0.5 MG tablet Take 1 tablet (0.5 mg total) by mouth 2 (two) times daily as needed for anxiety. (Patient not taking: Reported on 08/03/2016) 30 tablet 2  . methylPREDNISolone (MEDROL DOSEPAK) 4  MG TBPK tablet Take as directed on package (Patient not taking: Reported on 08/03/2016) 21 tablet 0   No current facility-administered medications for this visit.    No Known Allergies    Review of Systems:  Constitutional:  No  fever, no chills, No recent illness, No unintentional weight changes. No significant fatigue.   HEENT: No  headache, no vision change, no hearing change, No sore throat, No  sinus pressure  Cardiac: No  chest pain, No  pressure, No palpitations, No  Orthopnea  Respiratory:  No  shortness of breath. No  Cough  Gastrointestinal: No  abdominal pain, No  nausea, No  vomiting,  No  blood in stool, No  diarrhea, No  constipation   Musculoskeletal: No new myalgia/arthralgia  Genitourinary: No  incontinence, No  abnormal genital bleeding, No abnormal genital discharge  Skin: No  Rash, No other wounds/concerning lesions  Hem/Onc: No  easy bruising/bleeding, No  abnormal lymph node  Neurologic: No  weakness, +dizziness, No  slurred speech/focal weakness/facial droop  Psychiatric: +concerns with depression, +concerns with anxiety, No sleep problems, No mood problems.   Exam:  BP 134/76 (BP Location: Right Arm, Patient Position: Sitting, Cuff Size: Large)   Pulse 86   Temp 99 F (37.2 C) (Oral)   Resp 16  Ht 5' 9.5" (1.765 m)   Wt 182 lb 6.4 oz (82.7 kg)   SpO2 98%   BMI 26.55 kg/m   Constitutional: VS see above. General Appearance: alert, well-developed, well-nourished, NAD  Eyes: Normal lids and conjunctive, non-icteric sclera  Ears, Nose, Mouth, Throat: MMM, Normal external inspection ears/nares/mouth/lips/gums. TM normal bilaterally. Pharynx/tonsils no erythema, no exudate. Nasal mucosa normal.   Neck: No masses, trachea midline. No thyroid enlargement. No tenderness/mass appreciated. No lymphadenopathy  Respiratory: Normal respiratory effort. no wheeze, no rhonchi, no rales  Cardiovascular: S1/S2 normal, no murmur, no rub/gallop auscultated.  RRR. No lower extremity edema.   Musculoskeletal: Gait normal. No clubbing/cyanosis of digits.   Neurological: No cranial nerve deficit on limited exam. Motor and sensation intact and symmetric. Cerebellar reflexes intact. Normal balance/coordination. No tremor. Very slight nystagmus (horizontal) with leftward gaze, Dix-Hallpike (+)for reproduction of symptoms a bit worse on L but also on R, no nystagmus  Skin: warm, dry, intact.  Psychiatric: Normal judgment/insight. Normal mood and affect. Oriented x3.     ASSESSMENT/PLAN:   Likely BPPV rather than cardiac or other neurological, precautions reviewed and symptomatic treatment as below, printed modified Epley maneuvers for pt to try at home   There are other unrelated non-urgent complaints, but due to the busy schedule and the amount of time I've already spent with him, time does not permit me to address these routine issues at today's visit. I've requested he make another appointment to review these additional issues. RTC for shoulder pain if needed and to establish with PCP if desired.   Benign paroxysmal positional vertigo, unspecified laterality - Plan: meclizine (ANTIVERT) 25 MG tablet  Patient Instructions     If your symptoms are not getting better in about 1 week, or if they change or get worse before then, please let us know. At that point we may need to get an MRI. For now, try the home maneuvers and the medication as needed, and please let us know if you have any questions!   IF you received an x-ray today, you will receive an invoice from [facility]. Please contact [facility] at [phone] with questions or concerns regarding your invoice.   IF you received labwork today, you will receive an invoice from [employer]. Please contact [facility] at [phone] with questions or concerns regarding your invoice.   Our billing staff will not be able to assist you with questions  regarding bills from these companies.  You will be contacted with the lab results as soon as they are available. The fastest way to get your results is to activate your My Chart account. Instructions are located on the last page of this paperwork. If you have not heard from [place] regarding the results in 2 weeks, please contact this office.      Benign Positional Vertigo Introduction Vertigo is the feeling that you or your surroundings are moving when they are not. Benign positional vertigo is the most common form of vertigo. The cause of this condition is not serious (is benign). This condition is triggered by certain movements and positions (is positional). This condition can be dangerous if it occurs while you are doing something that could endanger you or others, such as driving. What are the causes? In many cases, the cause of this condition is not known. It may be caused by a disturbance in an area of the inner ear that helps your brain to sense movement and balance. This disturbance can be caused by  a viral infection (labyrinthitis), head injury, or repetitive motion. What increases the risk? This condition is more likely to develop in:  Women.  People who are 44 years of age or older. What are the signs or symptoms? Symptoms of this condition usually happen when you move your head or your eyes in different directions. Symptoms may start suddenly, and they usually last for less than a minute. Symptoms may include:  Loss of balance and falling.  Feeling like you are spinning or moving.  Feeling like your surroundings are spinning or moving.  Nausea and vomiting.  Blurred vision.  Dizziness.  Involuntary eye movement (nystagmus). Symptoms can be mild and cause only slight annoyance, or they can be severe and interfere with daily life. Episodes of benign positional vertigo may return (recur) over time, and they may be triggered by certain movements. Symptoms may improve over  time. How is this diagnosed? This condition is usually diagnosed by medical history and a physical exam of the head, neck, and ears. You may be referred to a health care provider who specializes in ear, nose, and throat (ENT) problems (otolaryngologist) or a provider who specializes in disorders of the nervous system (neurologist). You may have additional testing, including:  MRI.  A CT scan.  Eye movement tests. Your health care provider may ask you to change positions quickly while he or she watches you for symptoms of benign positional vertigo, such as nystagmus. Eye movement may be tested with an electronystagmogram (ENG), caloric stimulation, the Dix-Hallpike test, or the roll test.  An electroencephalogram (EEG). This records electrical activity in your brain.  Hearing tests. How is this treated? Usually, your health care provider will treat this by moving your head in specific positions to adjust your inner ear back to normal. Surgery may be needed in severe cases, but this is rare. In some cases, benign positional vertigo may resolve on its own in 2-4 weeks. Follow these instructions at home: Safety  Move slowly.Avoid sudden body or head movements.  Avoid driving.  Avoid operating heavy machinery.  Avoid doing any tasks that would be dangerous to you or others if a vertigo episode would occur.  If you have trouble walking or keeping your balance, try using a cane for stability. If you feel dizzy or unstable, sit down right away.  Return to your normal activities as told by your health care provider. Ask your health care provider what activities are safe for you. General instructions  Take over-the-counter and prescription medicines only as told by your health care provider.  Avoid certain positions or movements as told by your health care provider.  Drink enough fluid to keep your urine clear or pale yellow.  Keep all follow-up visits as told by your health care provider.  This is important. Contact a health care provider if:  You have a fever.  Your condition gets worse or you develop new symptoms.  Your family or friends notice any behavioral changes.  Your nausea or vomiting gets worse.  You have numbness or a "pins and needles" sensation. Get help right away if:  You have difficulty speaking or moving.  You are always dizzy.  You faint.  You develop severe headaches.  You have weakness in your legs or arms.  You have changes in your hearing or vision.  You develop a stiff neck.  You develop sensitivity to light. This information is not intended to replace advice given to you by your health care provider. Make sure you  discuss any questions you have with your health care provider. Document Released: 06/11/2006 Document Revised: 02/09/2016 Document Reviewed: 12/27/2014  2017 Elsevier        Visit summary with medication list and pertinent instructions was printed for patient to review. All questions at time of visit were answered - patient instructed to contact office with any additional concerns. ER/RTC precautions were reviewed with the patient. Follow-up plan: Return if symptoms worsen or fail to improve.

## 2016-08-03 NOTE — Patient Instructions (Addendum)
If your symptoms are not getting better in about 1 week, or if they change or get worse before then, please let us know. At that point we may need to get an MRI. For now, try the home maneuvers and the medication as needed, and please let us know if you have any questions!   IF you received an x-ray today, you will receive an invoice from [facility]. Please contact [facility] at [phone] with questions or concerns regarding your invoice.   IF you received labwork today, you will receive an invoice from [facility]. Please contact [facility] at [phone] with questions or concerns regarding your invoice.   Our billing staff will not be able to assist you with questions regarding bills from these companies.  You will be contacted with the lab results as soon as they are available. The fastest way to get your results is to activate your My Chart account. Instructions are located on the last page of this paperwork. If you have not heard from us regarding the results in 2 weeks, please contact this office.      Benign Positional Vertigo Introduction Vertigo is the feeling that you or your surroundings are moving when they are not. Benign positional vertigo is the most common form of vertigo. The cause of this condition is not serious (is benign). This condition is triggered by certain movements and positions (is positional). This condition can be dangerous if it occurs while you are doing something that could endanger you or others, such as driving. What are the causes? In many cases, the cause of this condition is not known. It may be caused by a disturbance in an area of the inner ear that helps your brain to sense movement and balance. This disturbance can be caused by a viral infection (labyrinthitis), head injury, or repetitive motion. What increases the risk? This condition is more likely to develop in:  Women.  People who are 44 years of  age or older. What are the signs or symptoms? Symptoms of this condition usually happen when you move your head or your eyes in different directions. Symptoms may start suddenly, and they usually last for less than a minute. Symptoms may include:  Loss of balance and falling.  Feeling like you are spinning or moving.  Feeling like your surroundings are spinning or moving.  Nausea and vomiting.  Blurred vision.  Dizziness.  Involuntary eye movement (nystagmus). Symptoms can be mild and cause only slight annoyance, or they can be severe and interfere with daily life. Episodes of benign positional vertigo may return (recur) over time, and they may be triggered by certain movements. Symptoms may improve over time. How is this diagnosed? This condition is usually diagnosed by medical history and a physical exam of the head, neck, and ears. You may be referred to a health care provider who specializes in ear, nose, and throat (ENT) problems (otolaryngologist) or a provider who specializes in disorders of the nervous system (neurologist). You may have additional testing, including:  MRI.  A CT scan.  Eye movement tests. Your health care provider may ask you to change positions quickly while he or she watches you for symptoms of benign positional vertigo, such as nystagmus. Eye movement may be tested with an electronystagmogram (ENG), caloric stimulation, the Dix-Hallpike test, or the roll test.  An electroencephalogram (EEG). This records electrical activity in your brain.  Hearing tests. How is this treated? Usually, your health care provider will treat this  by moving your head in specific positions to adjust your inner ear back to normal. Surgery may be needed in severe cases, but this is rare. In some cases, benign positional vertigo may resolve on its own in 2-4 weeks. Follow these instructions at home: Safety  Move slowly.Avoid sudden body or head movements.  Avoid  driving.  Avoid operating heavy machinery.  Avoid doing any tasks that would be dangerous to you or others if a vertigo episode would occur.  If you have trouble walking or keeping your balance, try using a cane for stability. If you feel dizzy or unstable, sit down right away.  Return to your normal activities as told by your health care provider. Ask your health care provider what activities are safe for you. General instructions  Take over-the-counter and prescription medicines only as told by your health care provider.  Avoid certain positions or movements as told by your health care provider.  Drink enough fluid to keep your urine clear or pale yellow.  Keep all follow-up visits as told by your health care provider. This is important. Contact a health care provider if:  You have a fever.  Your condition gets worse or you develop new symptoms.  Your family or friends notice any behavioral changes.  Your nausea or vomiting gets worse.  You have numbness or a "pins and needles" sensation. Get help right away if:  You have difficulty speaking or moving.  You are always dizzy.  You faint.  You develop severe headaches.  You have weakness in your legs or arms.  You have changes in your hearing or vision.  You develop a stiff neck.  You develop sensitivity to light. This information is not intended to replace advice given to you by your health care provider. Make sure you discuss any questions you have with your health care provider. Document Released: 06/11/2006 Document Revised: 02/09/2016 Document Reviewed: 12/27/2014  2017 Elsevier

## 2016-08-07 ENCOUNTER — Other Ambulatory Visit: Payer: Self-pay | Admitting: Family Medicine

## 2016-11-14 ENCOUNTER — Ambulatory Visit (INDEPENDENT_AMBULATORY_CARE_PROVIDER_SITE_OTHER): Payer: BLUE CROSS/BLUE SHIELD | Admitting: Family Medicine

## 2016-11-14 ENCOUNTER — Encounter: Payer: Self-pay | Admitting: Family Medicine

## 2016-11-14 VITALS — BP 136/74 | HR 80 | Temp 98.9°F | Resp 16 | Ht 69.5 in | Wt 184.0 lb

## 2016-11-14 DIAGNOSIS — M5412 Radiculopathy, cervical region: Secondary | ICD-10-CM

## 2016-11-14 DIAGNOSIS — Z981 Arthrodesis status: Secondary | ICD-10-CM

## 2016-11-14 DIAGNOSIS — M5432 Sciatica, left side: Secondary | ICD-10-CM

## 2016-11-14 DIAGNOSIS — M5030 Other cervical disc degeneration, unspecified cervical region: Secondary | ICD-10-CM

## 2016-11-14 MED ORDER — CYCLOBENZAPRINE HCL 10 MG PO TABS: 10.0000 mg | ORAL_TABLET | Freq: Three times a day (TID) | ORAL | 0 refills | Status: DC | PRN

## 2016-11-14 MED ORDER — METHYLPREDNISOLONE ACETATE 80 MG/ML IJ SUSP
80.0000 mg | Freq: Once | INTRAMUSCULAR | Status: AC
  Administered 2016-11-14: 80 mg via INTRAMUSCULAR

## 2016-11-14 MED ORDER — METHYLPREDNISOLONE 4 MG PO TBPK: ORAL_TABLET | ORAL | 0 refills | Status: DC

## 2016-11-14 MED ORDER — HYDROCODONE-ACETAMINOPHEN 7.5-325 MG PO TABS: 1.0000 | ORAL_TABLET | Freq: Four times a day (QID) | ORAL | 0 refills | Status: DC | PRN

## 2016-11-14 NOTE — Progress Notes (Signed)
Subjective:    Patient ID: Tyler Craig, male    DOB: 1970-10-30, 46 y.o.   MRN: [id number]  Patient presents for Back/ Leg Pain (generalized pain to upper/ mid/ and lower back- radiates down leg- some numbness to leg) Patient here with back pain radiating  Down his left side. 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 Few weeks ago he went to lift a heavy garage door he had severe pain with a burning sensation in his lower back midline and then he began having tingling and numbness going down his left leg a few days later he got out of bed at the same severe pain he's had constant numbness and tingling down his leg is mostly on the anterior thigh but he has had some into the calf as well. He has noticed weakness in his leg which he did not have participated the pain. He's also had increased pain in his neck he has history of cervical disc disease he has had surgery performed years ago ( > 5 years) he has a titanium plate in his neck but he has had increased pain as well as numbness in his thumb and index finger which has been persistent for the past couple months. He did call his neurosurgeon however there is some problem with an old pill and therefore he was unable to get the MRI that they ordered and unable to see them in the office as he did not have the money to pay off the bill.  No change in bowel or bladder  He has taken aleve   Review Of Systems:  GEN- denies fatigue, fever, weight loss,weakness, recent illness HEENT- denies eye drainage, change in vision, nasal discharge, CVS- denies chest pain, palpitations RESP- denies SOB, cough, wheeze ABD- denies N/V, change in stools, abd pain GU- denies dysuria, hematuria, dribbling, incontinence MSK-+joint pain, muscle aches, injury Neuro- denies headache, dizziness, syncope, seizure activity       Objective:    BP 136/74   Pulse 80    Temp 98.9 F (37.2 C) (Oral)   Resp 16   Ht 5' 9.5" (1.765 m)   Wt 184 lb (83.5 kg)   SpO2 99%   BMI 26.78 kg/m  GEN- NAD, alert and oriented x3 HEENT- PERRL, EOMI, non injected sclera, pink conjunctiva, MMM, oropharynx clear Neck- Supple, fair ROM, neg spurlings  MSK- TTP LUMBAR SPINE, +SLR left side, decreased ROM spine, +paraspinal spasm  Neuro- Tone equal bilat, sensation in tact LE, decreased sensation in left hand thumb/index finger, motor decreased left side 4/5 upper and Lower ext compared to right 5/5, reflexes in tact bilaterally,antalgic gait  EXT- No edema Pulses- Radial 2+        Assessment & Plan:      Problem List Items Addressed This Visit    None    Visit Diagnoses    Back pain with left-sided sciatica    -  Primary   Concern for slipped disc causing his syptoms with nerve impingment, he has chronic back pain and does heavy lifting. Will get MRI lumbar spine, given depo mEdrol injection, medrol dosepak, muscle relaxer and pain medication   Relevant Medications   HYDROcodone-acetaminophen (NORCO) 7.5-325 MG tablet   cyclobenzaprine (FLEXERIL) 10 MG tablet   methylPREDNISolone (MEDROL DOSEPAK) 4 MG TBPK tablet   methylPREDNISolone acetate (DEPO-MEDROL) injection 80 mg (Completed)   Other  Relevant Orders   MR Lumbar Spine Wo Contrast   DDD (degenerative disc disease), cervical       Known cervical disease but concerning change in sensation and weakness, obtain MRI has titanium plate. Will need to find ortho spine to see him if his neurosurgeon can not    Relevant Medications   HYDROcodone-acetaminophen (NORCO) 7.5-325 MG tablet   cyclobenzaprine (FLEXERIL) 10 MG tablet   methylPREDNISolone (MEDROL DOSEPAK) 4 MG TBPK tablet   methylPREDNISolone acetate (DEPO-MEDROL) injection 80 mg (Completed)   Other Relevant Orders   MR Cervical Spine Wo Contrast   History of fusion of cervical spine       Relevant Orders   MR Cervical Spine Wo Contrast   Cervical  radiculopathy       Relevant Medications   cyclobenzaprine (FLEXERIL) 10 MG tablet   Other Relevant Orders   MR Cervical Spine Wo Contrast      Note: This dictation was prepared with Dragon dictation along with smaller phrase technology. Any transcriptional errors that result from this process are unintentional.

## 2016-11-14 NOTE — Patient Instructions (Addendum)
Take steroid pack as prescribed starting tomorrow Take pain medicine at bedtime  Flexeril for spasm MRI of lumbar spine and Neck  Depo Medrol 80mg  given F/U pending results

## 2016-11-27 ENCOUNTER — Ambulatory Visit (HOSPITAL_COMMUNITY): Payer: BLUE CROSS/BLUE SHIELD

## 2016-11-28 ENCOUNTER — Ambulatory Visit (HOSPITAL_COMMUNITY)
Admission: RE | Admit: 2016-11-28 | Discharge: 2016-11-28 | Disposition: A | Discharge status: Home / Self Care | Payer: BLUE CROSS/BLUE SHIELD | Source: Ambulatory Visit | Attending: Family Medicine | Admitting: Family Medicine

## 2016-11-28 DIAGNOSIS — M4802 Spinal stenosis, cervical region: Secondary | ICD-10-CM | POA: Diagnosis not present

## 2016-11-28 DIAGNOSIS — M5412 Radiculopathy, cervical region: Secondary | ICD-10-CM | POA: Diagnosis not present

## 2016-11-28 DIAGNOSIS — M5126 Other intervertebral disc displacement, lumbar region: Secondary | ICD-10-CM | POA: Diagnosis not present

## 2016-11-28 DIAGNOSIS — M50221 Other cervical disc displacement at C4-C5 level: Secondary | ICD-10-CM | POA: Diagnosis not present

## 2016-11-28 DIAGNOSIS — M48061 Spinal stenosis, lumbar region without neurogenic claudication: Secondary | ICD-10-CM | POA: Diagnosis not present

## 2016-11-28 DIAGNOSIS — M5432 Sciatica, left side: Secondary | ICD-10-CM

## 2016-11-28 DIAGNOSIS — M5134 Other intervertebral disc degeneration, thoracic region: Secondary | ICD-10-CM | POA: Insufficient documentation

## 2016-11-28 DIAGNOSIS — Z981 Arthrodesis status: Secondary | ICD-10-CM

## 2016-11-28 DIAGNOSIS — M50222 Other cervical disc displacement at C5-C6 level: Secondary | ICD-10-CM | POA: Diagnosis not present

## 2016-11-28 DIAGNOSIS — M5030 Other cervical disc degeneration, unspecified cervical region: Secondary | ICD-10-CM

## 2016-11-28 DIAGNOSIS — M5127 Other intervertebral disc displacement, lumbosacral region: Secondary | ICD-10-CM | POA: Diagnosis not present

## 2016-11-29 ENCOUNTER — Other Ambulatory Visit: Payer: Self-pay | Admitting: *Deleted

## 2016-11-29 DIAGNOSIS — M5030 Other cervical disc degeneration, unspecified cervical region: Secondary | ICD-10-CM

## 2016-11-29 DIAGNOSIS — M5090 Cervical disc disorder, unspecified, unspecified cervical region: Secondary | ICD-10-CM

## 2016-11-30 ENCOUNTER — Other Ambulatory Visit: Payer: Self-pay | Admitting: Family Medicine

## 2016-11-30 NOTE — Telephone Encounter (Signed)
Prescription printed and patient made aware to come to office to pick up.  

## 2016-11-30 NOTE — Telephone Encounter (Signed)
Okay to refill? 

## 2016-11-30 NOTE — Telephone Encounter (Signed)
Ok to refill??  Last office visit/ refill 11/14/2016.

## 2016-12-04 ENCOUNTER — Telehealth (INDEPENDENT_AMBULATORY_CARE_PROVIDER_SITE_OTHER): Payer: Self-pay | Admitting: Orthopaedic Surgery

## 2016-12-04 NOTE — Telephone Encounter (Signed)
Called patient left message to return call to R/S appointment °

## 2016-12-11 ENCOUNTER — Telehealth: Payer: Self-pay | Admitting: *Deleted

## 2016-12-11 NOTE — Telephone Encounter (Signed)
Received call from patient.   States that he continues to have back pain. States that he has [facility], but appointment is scheduled out to 01/11/2017.  States that he is taking pain medication and BC powders as needed, but he is still having severe amount of pain. Also reports increased reflux due to medications.   Advised to stop taking BC powders. Appointment scheduled for 12/12/2016.

## 2016-12-11 NOTE — Telephone Encounter (Signed)
Agree with above 

## 2016-12-12 ENCOUNTER — Ambulatory Visit (INDEPENDENT_AMBULATORY_CARE_PROVIDER_SITE_OTHER): Payer: BLUE CROSS/BLUE SHIELD | Admitting: Family Medicine

## 2016-12-12 ENCOUNTER — Encounter: Payer: Self-pay | Admitting: Family Medicine

## 2016-12-12 DIAGNOSIS — G542 Cervical root disorders, not elsewhere classified: Secondary | ICD-10-CM

## 2016-12-12 DIAGNOSIS — M5136 Other intervertebral disc degeneration, lumbar region: Secondary | ICD-10-CM | POA: Diagnosis not present

## 2016-12-12 DIAGNOSIS — M5030 Other cervical disc degeneration, unspecified cervical region: Secondary | ICD-10-CM | POA: Diagnosis not present

## 2016-12-12 DIAGNOSIS — M51369 Other intervertebral disc degeneration, lumbar region without mention of lumbar back pain or lower extremity pain: Secondary | ICD-10-CM | POA: Insufficient documentation

## 2016-12-12 MED ORDER — METHYLPREDNISOLONE 4 MG PO TBPK: ORAL_TABLET | ORAL | 0 refills | Status: DC

## 2016-12-12 MED ORDER — HYDROCODONE-ACETAMINOPHEN 10-325 MG PO TABS: 1.0000 | ORAL_TABLET | Freq: Two times a day (BID) | ORAL | 0 refills | Status: DC

## 2016-12-12 MED ORDER — CYCLOBENZAPRINE HCL 10 MG PO TABS: 10.0000 mg | ORAL_TABLET | Freq: Three times a day (TID) | ORAL | 1 refills | Status: DC | PRN

## 2016-12-12 MED ORDER — METHYLPREDNISOLONE ACETATE 80 MG/ML IJ SUSP
80.0000 mg | Freq: Once | INTRAMUSCULAR | Status: AC
  Administered 2016-12-12: 80 mg via INTRAMUSCULAR

## 2016-12-12 NOTE — Progress Notes (Signed)
   Subjective:    Patient ID: Tyler Craig, male    DOB: 04-07-1971, 46 y.o.   MRN: [id number]  Patient presents for Back Pain Patient here with ongoing back and neck pain. No change in bowel or bladder He had MRI performed on both cervical and lumbar spine  Neck MRI showed multiple  DIsc bulges from C4-C6 causing some impingment on the nerves as well as DDD Lumbar MRI showed - mild disc bulge and arthritis but no pinched nerve on left side which is where he was complaining of at the last visit.  He has had neck surgery in the past. He is here today for pain control until he is able to be seen by the specialist which will not be until mid April. He was given Flexeril, steroid Dosepak and hydrocodone 7.5/325 mg at his visit 4 weeks ago.  He never received flexeril or dosepak  He was also taken a significant amount of BC powders which was causing eflux he was advised to stop these  Review Of Systems:  GEN- denies fatigue, fever, weight loss,weakness, recent illness HEENT- denies eye drainage, change in vision, nasal discharge, CVS- denies chest pain, palpitations RESP- denies SOB, cough, wheeze ABD- denies N/V, change in stools, abd pain GU- denies dysuria, hematuria, dribbling, incontinence MSK-+joint pain, muscle aches, injury Neuro- denies headache, dizziness, syncope, seizure activity       Objective:    BP 132/86   Pulse 80   Temp 98.7 F (37.1 C) (Oral)   Resp 14   Ht 5' 9.5" (1.765 m)   Wt 182 lb (82.6 kg)   SpO2 98%   BMI 26.49 kg/m  GEN- NAD, alert and oriented x3        Assessment & Plan:      Problem List Items Addressed This Visit    DDD (degenerative disc disease), lumbar    He is scheduled to see specialists in the meantime he is to stop the [facility] powders at these coughing gastritis which may lead to ulceration. I've given him a repeat Depo-Medrol injection he is to take a Medrol Dosepak instead. If also increase his hydrocodone to 10/325 mg he is to take  1 twice a day as needed and he will follow-up in April per above. No new red flags      Relevant Medications   HYDROcodone-acetaminophen (NORCO) 10-325 MG tablet   cyclobenzaprine (FLEXERIL) 10 MG tablet   methylPREDNISolone (MEDROL DOSEPAK) 4 MG TBPK tablet   methylPREDNISolone acetate (DEPO-MEDROL) injection 80 mg (Completed)   DDD (degenerative disc disease), cervical   Relevant Medications   HYDROcodone-acetaminophen (NORCO) 10-325 MG tablet   cyclobenzaprine (FLEXERIL) 10 MG tablet   methylPREDNISolone (MEDROL DOSEPAK) 4 MG TBPK tablet   methylPREDNISolone acetate (DEPO-MEDROL) injection 80 mg (Completed)   Cervical nerve root impingement   Relevant Medications   cyclobenzaprine (FLEXERIL) 10 MG tablet   methylPREDNISolone acetate (DEPO-MEDROL) injection 80 mg (Completed)      Note: This dictation was prepared with Dragon dictation along with smaller phrase technology. Any transcriptional errors that result from this process are unintentional.

## 2016-12-12 NOTE — Assessment & Plan Note (Signed)
He is scheduled to see specialists in the meantime he is to [facility] the [facility] at these coughing gastritis which may lead to ulceration. I've given him a repeat Depo-Medrol injection he is to take a Medrol Dosepak instead. If also increase his hydrocodone to 10/325 mg he is to take 1 twice a day as needed and he will follow-up in April per above. No new red flags

## 2016-12-12 NOTE — Patient Instructions (Signed)
Take the pain medication as prescribed Take the steroid pack Muscle relaxer as needed F/U as needed

## 2016-12-13 ENCOUNTER — Other Ambulatory Visit: Payer: Self-pay | Admitting: Family Medicine

## 2016-12-13 MED ORDER — FLUOXETINE HCL 40 MG PO CAPS: 40.0000 mg | ORAL_CAPSULE | Freq: Every day | ORAL | 1 refills | Status: DC

## 2016-12-13 MED ORDER — ATORVASTATIN CALCIUM 20 MG PO TABS: 20.0000 mg | ORAL_TABLET | Freq: Every day | ORAL | 1 refills | Status: DC

## 2016-12-13 NOTE — Addendum Note (Signed)
Addended by: Phillips OdorSIX, Zollie Ellery H on: 12/13/2016 10:16 AM   Modules accepted: Orders

## 2016-12-31 ENCOUNTER — Telehealth: Payer: Self-pay | Admitting: *Deleted

## 2016-12-31 MED ORDER — HYDROCODONE-ACETAMINOPHEN 10-325 MG PO TABS: 1.0000 | ORAL_TABLET | Freq: Two times a day (BID) | ORAL | 0 refills | Status: DC

## 2016-12-31 NOTE — Telephone Encounter (Signed)
Okay to refill? 

## 2016-12-31 NOTE — Telephone Encounter (Signed)
Prescription printed and patient made aware to come to office to pick up after 3pm on 12/31/2016. 

## 2016-12-31 NOTE — Telephone Encounter (Signed)
Received call from patient.   Reports that he has been taking Hydrocodone TID for back pain.   States that he is scheduled to see specialist on 01/11/2017.  Requested refill on pain medication.   Ok to refill??  Last office visit/ refill 12/12/2016.

## 2017-01-01 ENCOUNTER — Ambulatory Visit (INDEPENDENT_AMBULATORY_CARE_PROVIDER_SITE_OTHER): Payer: BLUE CROSS/BLUE SHIELD | Admitting: Orthopaedic Surgery

## 2017-01-01 MED ORDER — HYDROCODONE-ACETAMINOPHEN 10-325 MG PO TABS: 1.0000 | ORAL_TABLET | Freq: Three times a day (TID) | ORAL | 0 refills | Status: DC | PRN

## 2017-01-01 NOTE — Addendum Note (Signed)
Addended by: Phillips Odor on: 01/01/2017 01:03 PM   Modules accepted: Orders

## 2017-01-01 NOTE — Telephone Encounter (Signed)
Okay to change to TID

## 2017-01-01 NOTE — Telephone Encounter (Signed)
Prescription printed.   Call placed to patient and patient made aware.

## 2017-01-01 NOTE — Telephone Encounter (Signed)
Received call from patient.   Reports that he cannot fill prescription for Hydrocodone at this time d/t prescription reading BID.   States that he can fill if prescription is changed to TID.   MD please advise.

## 2017-01-08 ENCOUNTER — Ambulatory Visit (INDEPENDENT_AMBULATORY_CARE_PROVIDER_SITE_OTHER): Payer: BLUE CROSS/BLUE SHIELD | Admitting: Orthopaedic Surgery

## 2017-01-11 ENCOUNTER — Encounter (INDEPENDENT_AMBULATORY_CARE_PROVIDER_SITE_OTHER): Payer: Self-pay | Admitting: Orthopaedic Surgery

## 2017-01-11 ENCOUNTER — Ambulatory Visit (INDEPENDENT_AMBULATORY_CARE_PROVIDER_SITE_OTHER): Payer: Self-pay

## 2017-01-11 ENCOUNTER — Ambulatory Visit (INDEPENDENT_AMBULATORY_CARE_PROVIDER_SITE_OTHER): Payer: BLUE CROSS/BLUE SHIELD | Admitting: Orthopaedic Surgery

## 2017-01-11 VITALS — BP 141/89 | HR 104 | Ht 70.0 in | Wt 184.0 lb

## 2017-01-11 DIAGNOSIS — M4722 Other spondylosis with radiculopathy, cervical region: Secondary | ICD-10-CM

## 2017-01-11 DIAGNOSIS — M5126 Other intervertebral disc displacement, lumbar region: Secondary | ICD-10-CM

## 2017-01-11 DIAGNOSIS — M542 Cervicalgia: Secondary | ICD-10-CM | POA: Diagnosis not present

## 2017-01-11 NOTE — Progress Notes (Deleted)
   Office Visit Note   Patient: Tyler Craig           Date of Birth: 04-11-1971           MRN: [id number] Visit Date: 01/11/2017              Requested by: Salley Scarlet, MD [address] [place], [place] [address] PCP: Milinda Antis, MD   Assessment & Plan: Visit Diagnoses:  1. Neck pain   2. Other spondylosis with radiculopathy, cervical region   3. Protrusion of lumbar intervertebral disc     Plan: ***  Follow-Up Instructions: No Follow-up on file.   Orders:  Orders Placed This Encounter  Procedures  . XR Cervical Spine 2 or 3 views   No orders of the defined types were placed in this encounter.     Procedures: No procedures performed   Clinical Data: No additional findings.   Subjective: Chief Complaint  Patient presents with  . Neck - Pain  . Lower Back - Pain    HPI  Review of Systems   Objective: Vital Signs: BP (!) 141/89   Pulse (!) 104   Ht  (1.778 m)   Wt 184 lb (83.5 kg)   BMI 26.40 kg/m   Physical Exam  Ortho Exam  Specialty Comments:  No specialty comments available.  Imaging: Xr Cervical Spine 2 Or 3 Views  Result Date: 01/11/2017 AP lateral cervical spine x-rays obtained and reviewed. It shows fusion at C6-7. Mild spurring at C4-5 and C5-6. Impression: Cervical spondylosis with solid fusion at C6-7.    PMFS History: Patient Active Problem List   Diagnosis Date Noted  . DDD (degenerative disc disease), cervical 12/12/2016  . DDD (degenerative disc disease), lumbar 12/12/2016  . 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 Benign paroxysmal positional vertigo 08/03/2016  . Hyperlipidemia 11/16/2014  . Insomnia 07/05/2014  . Elbow pain 02/09/2014  . MDD (major depressive disorder) 12/29/2013  . GAD (generalized anxiety disorder) 12/29/2013  . Smoker 12/29/2013   Past Medical History:  Diagnosis Date  . Anxiety     Family History  Problem Relation Age of Onset  . Hypertension Mother   . Cancer  Father 16    colon cancer  . Heart disease Maternal Uncle 48    massive MI  . Heart disease Maternal Grandfather     Past Surgical History:  Procedure Laterality Date  . SPINE SURGERY     neck surgery- has plate   Social History   Occupational History  . Not on file.   Social History Main Topics  . Smoking status: Current Every Day Smoker    Packs/day: 1.50    Years: 20.00    Types: Cigarettes  . Smokeless tobacco: Never Used  . Alcohol use No  . Drug use: Yes    Frequency: 1.0 time per week    Types: Marijuana  . Sexual activity: Yes

## 2017-01-12 NOTE — Progress Notes (Signed)
Consult Note   Patient: Tyler Craig             Date of Birth: Mar 19, 1971           MRN: [id number]             Visit Date: 01/11/2017  Requested by: Salley Scarlet, MD [address] [place], [place] [address] PCP: Milinda Antis, MD  Thank you for asking me to evaluate this patient for Pain of the Neck and Pain of the Lower Back  Below are my findings.     Assessment & Plan: Visit Diagnoses:  1. Neck pain   2. Other spondylosis with radiculopathy, cervical region   3. Protrusion of lumbar intervertebral disc     Plan: Patient's lumbar leg symptoms are on the opposite side from the disc extrusion. We'll defer treatment at this time we might try a single epidural injection later. He has a solid fusion from his previous surgery to 10 years ago but has developed progressive canal stenosis and foraminal stenosis at the next 2 levels C4-5 and C5-6. We discussed the surgical treatment options for his adjacent level progression. This would include fusing C4-5 and also C5-6. He understands the benefit over 6 weeks after the procedure. Risks of pseudoarthrosis discussed possibility of posterior procedure needed if it did not heal solidly and he was symptomatic. He understands he may not get total return of his strength in his arms and shoulders. His wife was present with him today and included in our discussion. He can consider his options and call if he would like to proceed. Thank you for the opportunity to see him in consultation  Follow Up Instructions: No Follow-up on file.  Orders: Orders Placed This Encounter  Procedures  . XR Cervical Spine 2 or 3 views   No orders of the defined types were placed in this encounter.    Procedures: No procedures performed   Clinical Data: No additional findings.   Subjective:  Chief Complaint  Patient presents with  . Neck - Pain  . Lower Back - Pain     HPI patient states she's had increased pain in his neck and lower  back. He was lifting a garage door March 2018 states he had increased pain in his neck that radiates into shoulders. He's been taking hydrocodone 10/325   With #60 tablets prescribed 01/01/2017 taking 1 by mouth 3 times a day. He's also use Flexeril. Previous cervical fusion 810 years ago by Dr. Phoebe Perch at the C6-7 level which fused. Patient noticed over the last 2 years gradual loss of strength in his arms primarily right shoulder region and also increased numbness in his hands. Has tingling more on the left and right radial 3 digits. He's taking prednisone Pak 2 with the short-term temporary relief, as well has OTC anti-inflammatories without relief. Is also steroid shot with some relief. 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  Low back pain radiates across lumbosacral region and down the left leg. MRI scan 11/28/2016 showed right disc protrusion L5-S1 slightly displacing the S1 nerve root no left side compression.  Review of Systems  Constitutional: Negative for chills and diaphoresis.  HENT: Negative for ear discharge, ear pain and nosebleeds.   Eyes: Negative for discharge and visual disturbance.  Respiratory: Negative for cough, choking and shortness of breath.   Cardiovascular: Negative for chest pain and palpitations.  Gastrointestinal: Negative for abdominal distention and abdominal pain.  Endocrine: Negative for cold intolerance and heat intolerance.  Genitourinary: Negative for flank pain and hematuria.  Skin: Negative for rash and wound.  Neurological: Negative for seizures and speech difficulty.  Hematological: Negative for adenopathy. Does not bruise/bleed easily.  Psychiatric/Behavioral: Negative for agitation and suicidal ideas.   patient does have a history of major depression disorder, history of smoking, insomnia, hyperlipidemia. Cervical spondylosis and lumbar disc protrusion.   Objective: Vital Signs:  BP (!) 141/89   Pulse (!) 104   Ht  (1.778 m)   Wt 184 lb (83.5 kg)   BMI 26.40 kg/m   Physical Exam  Constitutional: He is oriented to person, place, and time. He appears well-developed and well-nourished.  HENT:  Head: Normocephalic and atraumatic.  Eyes: EOM are normal. Pupils are equal, round, and reactive to light.  Neck: No tracheal deviation present. No thyromegaly present.  Cardiovascular: Normal rate.   Pulmonary/Chest: Effort normal. He has no wheezes.  Abdominal: Soft. Bowel sounds are normal.  Musculoskeletal:  Patient has increased pain with cervical compression some relief with distraction. Brachial plexus tenderness both right and left worse on the right side. He has some deltoid atrophy worse on the right than left. Slight weakness biceps wrist extension. Good triceps strength. Phalen's test at the wrist carpal compression over the canal is negative. Median nerve rolled her nerve of forearm are normal. 1+ biceps triceps brachioradialis. 2+ knee and ankle jerk. No clonus. Negative straight leg raising right and left. He has some sciatic notch tenderness both right and left. Anterior tib EHL gastrocsoleus is strong he can heel and toe walk. Quads hip flexors are strong. Distal pulses are normal.  Neurological: He is alert and oriented to person, place, and time.  Skin: Skin is warm and dry. Capillary refill takes less than 2 seconds.  Psychiatric: He has a normal mood and affect. His behavior is normal. Judgment and thought content normal.     Ortho Exam   Specialty Comments:  No specialty comments available.  Imaging:  Study Result   CLINICAL DATA:  Initial evaluation for lower back pain radiating down left leg for 1 month.  EXAM: MRI LUMBAR SPINE WITHOUT CONTRAST  TECHNIQUE: Multiplanar, multisequence MR imaging of the lumbar spine was performed. No intravenous contrast was administered.  COMPARISON:  None available.  FINDINGS: Segmentation:  Normal segmentation. Lowest well-formed disc is labeled the L5-S1 level.  Alignment: Vertebral bodies normally aligned with preservation of the normal lumbar lordosis. No listhesis.  Vertebrae: Vertebral body heights are well maintained. No evidence for acute or chronic fracture. Signal intensity within the vertebral body bone marrow is normal. Minimal reactive endplate changes noted at the superior endplate of T12. No focal osseous lesions.  Conus medullaris: Extends to the L2 level and appears normal.  Paraspinal and other soft tissues: Paraspinous soft tissues within normal limits. Visualized visceral structures unremarkable.  Disc levels:  T11-12: Diffuse degenerative disc bulge with disc desiccation. Facet degeneration. No significant canal narrowing. Mild bilateral foraminal stenosis.  T12-L1:  Unremarkable.  L1-2:  Unremarkable.  L2-3:  Unremarkable.  L3-4: Diffuse circumferential disc bulge with disc desiccation and mild intervertebral disc space narrowing. Minimal reactive endplate changes. No significant canal stenosis. Mild bilateral foraminal narrowing.  L4-5: Minimal annular disc bulge with disc desiccation. No focal disc protrusion. No significant canal or lateral recess stenosis. Mild bilateral foraminal narrowing.  L5-S1: Diffuse disc bulge with disc desiccation. Superimposed right subarticular disc protrusion  with slight caudad angulation. Protruding disc encroaches upon the right lateral recess, abutting the transiting right S1 nerve root (series 6, image 33). Right S1 nerve root slightly displaced posteriorly. Minimal facet degeneration. Mild bilateral foraminal stenosis.  IMPRESSION: 1. Right subarticular disc protrusion at L5-S1, contacting and slightly displacing the transiting S1 nerve root in the right lateral recess. 2. Mild degenerative spondylolysis at L3-4 and L4-5 with resultant mild bilateral foraminal stenosis. 3. Mild  degenerative disc bulge at T11-12 with resultant mild bilateral foraminal narrowing. 4. Mild bilateral facet arthrosis at L5-S1.   Electronically Signed   By: Rise Mu M.D.   On: 11/28/2016 17:40     Study Result   CLINICAL DATA:  Initial evaluation for increased pain and paresthesia of left hand. History degenerative disc disease. History of prior cervical spinal fusion.  EXAM: MRI CERVICAL SPINE WITHOUT CONTRAST  TECHNIQUE: Multiplanar, multisequence MR imaging of the cervical spine was performed. No intravenous contrast was administered.  COMPARISON:  Prior MRI from 10/29/2011.  FINDINGS: Alignment: Vertebral bodies normally aligned with preservation of the normal cervical lordosis. No listhesis.  Vertebrae: Patient is status post ACDF at C6-7. Signal intensity within the vertebral body bone marrow within normal limits. No evidence for acute or chronic fracture. No focal osseous lesions. No abnormal marrow edema.  Cord: Signal intensity within the cervical spinal cord is normal.  Posterior Fossa, vertebral arteries, paraspinal tissues: Visualized brain and posterior fossa within normal limits. Probable minimal small vessel type changes present within the pons. Craniocervical junction normal. Paraspinous and prevertebral soft tissues within normal limits. Normal intravascular flow voids present within the vertebral arteries bilaterally.  Disc levels:  C2-C3: Right foraminal disc osteophyte complex with resultant mild right foraminal stenosis, similar to previous. Minimal posterior disc bulge without significant canal stenosis. Left neural foramina widely patent.  C3-C4: Broad posterior disc osteophyte complex indents and partially faces the ventral thecal sac and results in mild canal stenosis. This is stable from previous. No significant foraminal encroachment.  C4-C5: Diffuse degenerative disc bulge with bilateral  uncovertebral spurring, greater on the left. Broad right eccentric disc osteophyte complex encroaches upon the right neural foramen, resulting in moderate right foraminal stenosis, similar to previous. This potentially affects the right C5 nerve root. Flattening of the right ventral thecal sac and right aspect of the cervical spinal cord without cord signal changes, similar to previous. Mild canal stenosis. Mild left foraminal narrowing, also similar.  C5-C6: Diffuse degenerative disc bulge with bilateral uncovertebral spurring. Protruding disc flattens and indents the ventral thecal sac with mild cord flattening, potentially affecting either of the C6 nerve roots (series 6, image 20). No associated cord signal changes. Resultant mild-to-moderate canal stenosis. This is slightly progressed from previous. Superimposed uncovertebral hypertrophy with resultant mild bilateral foraminal stenosis, greater on the left.  C6-C7:  Prior ACDF.  No residual stenosis.  C7-T1: Shallow posterior disc bulge minimally flattens the ventral thecal sac. Bilateral facet hypertrophy. No stenosis.  Mild disc bulging noted at T2-3 without significant stenosis. Remainder the visualized upper thoracic spine unremarkable.  IMPRESSION: 1. Interval ACDF at C6-7 without residual stenosis. 2. Broad posterior disc bulge at C5-6 with resultant mild to moderate canal stenosis, slightly progressed from previous. This could potentially effect either of the C6 nerve roots. 3. Broad right eccentric disc osteophyte complex at C4-5 with resultant mild canal and moderate right foraminal stenosis, potentially affecting the right C5 nerve root. This is similar to previous. 4. Mildly progressed degenerative disc bulge at C7-T1  without stenosis.   Electronically Signed   By: Rise Mu M.D.   On: 11/28/2016 17:33     PMFS History: Patient Active Problem List   Diagnosis Date Noted  . DDD  (degenerative disc disease), cervical 12/12/2016  . DDD (degenerative disc disease), lumbar 12/12/2016  . 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 Benign paroxysmal positional vertigo 08/03/2016  . Hyperlipidemia 11/16/2014  . Insomnia 07/05/2014  . Elbow pain 02/09/2014  . MDD (major depressive disorder) 12/29/2013  . GAD (generalized anxiety disorder) 12/29/2013  . Smoker 12/29/2013   Past Medical History:  Diagnosis Date  . Anxiety     Family History  Problem Relation Age of Onset  . Hypertension Mother   . Cancer Father 8    colon cancer  . Heart disease Maternal Uncle 48    massive MI  . Heart disease Maternal Grandfather    Past Surgical History:  Procedure Laterality Date  . SPINE SURGERY     neck surgery- has plate   Social History   Occupational History  . Not on file.   Social History Main Topics  . Smoking status: Current Every Day Smoker    Packs/day: 1.50    Years: 20.00    Types: Cigarettes  . Smokeless tobacco: Never Used  . Alcohol use No  . Drug use: Yes    Frequency: 1.0 time per week    Types: Marijuana  . Sexual activity: Yes    AP lateral cervical spine x-rays obtained today shows fusion at C6-7 with mild spurring at C4-5 and C5-6.  Impression cervical spondylosis with solid fusion and C 6-7 and mild spurring to cephalad adjacent levels .

## 2017-01-21 ENCOUNTER — Telehealth (INDEPENDENT_AMBULATORY_CARE_PROVIDER_SITE_OTHER): Payer: Self-pay | Admitting: Orthopaedic Surgery

## 2017-01-21 NOTE — Telephone Encounter (Signed)
Patient called needing a refill on his pain medication. CB # [phone]

## 2017-01-21 NOTE — Telephone Encounter (Signed)
Patient was prescribed Hydrocodone 10/325 #60 on 01/01/2017. He has been taking TID. Original Rx from Dr. Jeanice Limurham prior to his visit with you. Ok to refill?

## 2017-01-22 MED ORDER — HYDROCODONE-ACETAMINOPHEN 5-325 MG PO TABS: 1.0000 | ORAL_TABLET | Freq: Three times a day (TID) | ORAL | 0 refills | Status: DC | PRN

## 2017-01-22 NOTE — Telephone Encounter (Signed)
Print Rx for norco 5/325 # 30     One po tid prn pain.   Tyler MaxwellCheryl to call him , he has been waiting on call to get scheduled for surgery.

## 2017-01-22 NOTE — Telephone Encounter (Signed)
Script printed. Awaiting Dr. Ophelia CharterYates signature and will place up front for pick up

## 2017-01-22 NOTE — Telephone Encounter (Signed)
I called patient.  Left message on his voicemail to please return my call to discuss scheduling surgery.

## 2017-01-22 NOTE — Addendum Note (Signed)
Addended by: Rogers SeedsYEATTS, Warwick Nick M on: 01/22/2017 11:56 AM   Modules accepted: Orders

## 2017-01-25 ENCOUNTER — Other Ambulatory Visit (INDEPENDENT_AMBULATORY_CARE_PROVIDER_SITE_OTHER): Payer: Self-pay | Admitting: Orthopaedic Surgery

## 2017-01-25 DIAGNOSIS — M4712 Other spondylosis with myelopathy, cervical region: Secondary | ICD-10-CM

## 2017-01-30 ENCOUNTER — Encounter (HOSPITAL_COMMUNITY): Payer: Self-pay

## 2017-01-30 ENCOUNTER — Encounter (HOSPITAL_COMMUNITY)
Admission: RE | Admit: 2017-01-30 | Discharge: 2017-01-30 | Disposition: A | Discharge status: Home / Self Care | Payer: BLUE CROSS/BLUE SHIELD | Source: Ambulatory Visit | Attending: Orthopaedic Surgery | Admitting: Orthopaedic Surgery

## 2017-01-30 DIAGNOSIS — Z01812 Encounter for preprocedural laboratory examination: Secondary | ICD-10-CM | POA: Insufficient documentation

## 2017-01-30 DIAGNOSIS — M4712 Other spondylosis with myelopathy, cervical region: Secondary | ICD-10-CM

## 2017-01-30 HISTORY — DX: Other specified postprocedural states: R11.2

## 2017-01-30 HISTORY — DX: Nausea with vomiting, unspecified: Z98.890

## 2017-01-30 HISTORY — DX: Hyperlipidemia, unspecified: E78.5

## 2017-01-30 HISTORY — DX: Anesthesia of skin: R20.0

## 2017-01-30 HISTORY — DX: Other muscle spasm: M62.838

## 2017-01-30 LAB — URINALYSIS, ROUTINE W REFLEX MICROSCOPIC
Bilirubin Urine: NEGATIVE
Glucose, UA: NEGATIVE mg/dL
Hgb urine dipstick: NEGATIVE
KETONES UR: NEGATIVE mg/dL
LEUKOCYTES UA: NEGATIVE
NITRITE: NEGATIVE
PH: 5 (ref 5.0–8.0)
Protein, ur: NEGATIVE mg/dL
SPECIFIC GRAVITY, URINE: 1.026 (ref 1.005–1.030)

## 2017-01-30 LAB — COMPREHENSIVE METABOLIC PANEL
ALT: 21 U/L (ref 17–63)
ANION GAP: 7 (ref 5–15)
AST: 22 U/L (ref 15–41)
Albumin: 4.2 g/dL (ref 3.5–5.0)
Alkaline Phosphatase: 55 U/L (ref 38–126)
BUN: 12 mg/dL (ref 6–20)
CO2: 22 mmol/L (ref 22–32)
Calcium: 9.4 mg/dL (ref 8.9–10.3)
Chloride: 108 mmol/L (ref 101–111)
Creatinine, Ser: 0.93 mg/dL (ref 0.61–1.24)
GFR calc non Af Amer: >60 mL/min (ref >60)
Glucose, Bld: 86 mg/dL (ref 65–99)
POTASSIUM: 4.4 mmol/L (ref 3.5–5.1)
SODIUM: 137 mmol/L (ref 135–145)
Total Bilirubin: 0.4 mg/dL (ref 0.3–1.2)
Total Protein: 6.8 g/dL (ref 6.5–8.1)

## 2017-01-30 LAB — SURGICAL PCR SCREEN
MRSA, PCR: NEGATIVE
STAPHYLOCOCCUS AUREUS: NEGATIVE

## 2017-01-30 LAB — CBC
HCT: 45.7 % (ref 39.0–52.0)
Hemoglobin: 14.6 g/dL (ref 13.0–17.0)
MCH: 32.2 pg (ref 26.0–34.0)
MCHC: 31.9 g/dL (ref 30.0–36.0)
MCV: 100.9 fL — ABNORMAL HIGH (ref 78.0–100.0)
Platelets: 273 10*3/uL (ref 150–400)
RBC: 4.53 MIL/uL (ref 4.22–5.81)
RDW: 15.2 % (ref 11.5–15.5)
WBC: 9.5 10*3/uL (ref 4.0–10.5)

## 2017-01-30 MED ORDER — CHLORHEXIDINE GLUCONATE 4 % EX LIQD: 60.0000 mL | Freq: Once | CUTANEOUS | Status: DC

## 2017-01-30 NOTE — Progress Notes (Addendum)
Cardiologist denies  Medical Md is with [facility] Dr.  Echo denies  Stress test denies  Heart cath denies  EKG denies in past yr  CXR denies in past yr

## 2017-01-30 NOTE — Pre-Procedure Instructions (Signed)
Tyler Craig  01/30/2017      [facility] - [place], [place] - [address] [place] [address] Phone: [phone] Fax: [phone]  [facility] [address] - [place], [place] - [address] [place] [address] Phone: [phone] Fax: [phone]  [facility] [address] [place] - [id number] [place] [address] Phone: [phone] Fax: [phone]    Your procedure is scheduled on Mon, May 21 @ 12:30 PM  [facility] at 10:30 AM  Call this number if you have problems the morning of surgery:  [phone]   Remember:  Do not eat food or drink liquids after midnight.  Take these medicines the morning of surgery with A SIP OF WATER Prozac(Fluoxetine) and Pain Pill(if needed)             Stop taking any Vitamins or Herbal Medications. No Goody's,BC's,Aleve,Advil,Motrin,Aspirin,Ibuprofen,or Fish Oil.    Do not wear jewelry.  Do not wear lotions, powders,colognes, or deoderant.             Men may shave face and neck.  Do not bring valuables to the hospital.  [facility] belongings or valuables.  Contacts, dentures or bridgework may not be worn into surgery.  Leave your suitcase in the car.  After surgery it may be brought to your room.  For patients admitted to the hospital, discharge time will be determined by your treatment team.  Patients discharged the day of surgery will not be allowed to drive home.   [facility]  Before surgery, you can play an important role.  Because skin is not sterile, your skin needs to be as free of germs as possible.  You can reduce the number of germs on you skin by washing with CHG (chlorahexidine gluconate) soap before surgery.  CHG is an antiseptic cleaner which kills germs and bonds with the skin to  continue killing germs even after washing.  Please DO NOT use if you have an allergy to CHG or antibacterial soaps.  If your skin becomes reddened/irritated stop using the CHG and inform your nurse when you arrive at Short Stay.  Do not shave (including legs and underarms) for at least 48 hours prior to the first CHG shower.  You may shave your face.  Please follow these instructions carefully:   1.  Shower with CHG Soap the night before surgery and the                                morning of Surgery.  2.  If you choose to wash your hair, wash your hair first as usual with your       normal shampoo.  3.  After you shampoo, rinse your hair and body thoroughly to remove the                      Shampoo.  4.  Use CHG as you would any other liquid soap.  You can apply chg directly       to the skin and wash gently with scrungie or a clean washcloth.  5.  Apply the CHG Soap to your body ONLY FROM THE NECK DOWN.        Do not use on open wounds or  open sores.  Avoid contact with your eyes,       ears, mouth and genitals (private parts).  Wash genitals (private parts)       with your normal soap.  6.  Wash thoroughly, paying special attention to the area where your surgery        will be performed.  7.  Thoroughly rinse your body with warm water from the neck down.  8.  DO NOT shower/wash with your normal soap after using and rinsing off       the CHG Soap.  9.  Pat yourself dry with a clean towel.            10.  Wear clean pajamas.            11.  Place clean sheets on your bed the night of your first shower and do not        sleep with pets.  Day of Surgery  Do not apply any lotions/deoderants the morning of surgery.  Please wear clean clothes to the hospital/surgery center.    Please read over the following fact sheets that you were given. Pain Booklet, Coughing and Deep Breathing, MRSA Information and Surgical Site Infection Prevention

## 2017-02-04 ENCOUNTER — Observation Stay (HOSPITAL_COMMUNITY)
Admission: RE | Admit: 2017-02-04 | Discharge: 2017-02-05 | Disposition: A | Discharge status: Home / Self Care | Payer: BLUE CROSS/BLUE SHIELD | Source: Ambulatory Visit | Attending: Orthopaedic Surgery | Admitting: Orthopaedic Surgery

## 2017-02-04 ENCOUNTER — Encounter (HOSPITAL_COMMUNITY)
Admission: RE | Disposition: A | Discharge status: Home / Self Care | Payer: Self-pay | Source: Ambulatory Visit | Attending: Orthopaedic Surgery

## 2017-02-04 ENCOUNTER — Encounter (HOSPITAL_COMMUNITY): Payer: Self-pay

## 2017-02-04 ENCOUNTER — Ambulatory Visit (HOSPITAL_COMMUNITY): Payer: BLUE CROSS/BLUE SHIELD | Admitting: Anesthesiology

## 2017-02-04 ENCOUNTER — Ambulatory Visit (HOSPITAL_COMMUNITY): Payer: BLUE CROSS/BLUE SHIELD

## 2017-02-04 DIAGNOSIS — F17210 Nicotine dependence, cigarettes, uncomplicated: Secondary | ICD-10-CM | POA: Insufficient documentation

## 2017-02-04 DIAGNOSIS — Z981 Arthrodesis status: Secondary | ICD-10-CM | POA: Diagnosis not present

## 2017-02-04 DIAGNOSIS — F419 Anxiety disorder, unspecified: Secondary | ICD-10-CM | POA: Insufficient documentation

## 2017-02-04 DIAGNOSIS — M4802 Spinal stenosis, cervical region: Secondary | ICD-10-CM | POA: Diagnosis not present

## 2017-02-04 DIAGNOSIS — M5136 Other intervertebral disc degeneration, lumbar region: Secondary | ICD-10-CM | POA: Diagnosis not present

## 2017-02-04 DIAGNOSIS — M50321 Other cervical disc degeneration at C4-C5 level: Secondary | ICD-10-CM | POA: Diagnosis not present

## 2017-02-04 DIAGNOSIS — M47812 Spondylosis without myelopathy or radiculopathy, cervical region: Secondary | ICD-10-CM | POA: Diagnosis not present

## 2017-02-04 DIAGNOSIS — Z419 Encounter for procedure for purposes other than remedying health state, unspecified: Secondary | ICD-10-CM

## 2017-02-04 DIAGNOSIS — M5412 Radiculopathy, cervical region: Secondary | ICD-10-CM | POA: Insufficient documentation

## 2017-02-04 DIAGNOSIS — M4712 Other spondylosis with myelopathy, cervical region: Secondary | ICD-10-CM

## 2017-02-04 DIAGNOSIS — M4322 Fusion of spine, cervical region: Secondary | ICD-10-CM | POA: Diagnosis not present

## 2017-02-04 DIAGNOSIS — E785 Hyperlipidemia, unspecified: Secondary | ICD-10-CM | POA: Diagnosis not present

## 2017-02-04 HISTORY — PX: ANTERIOR CERVICAL DECOMP/DISCECTOMY FUSION: SHX1161

## 2017-02-04 SURGERY — ANTERIOR CERVICAL DECOMPRESSION/DISCECTOMY FUSION 2 LEVELS
Anesthesia: General

## 2017-02-04 MED ORDER — BUPIVACAINE HCL (PF) 0.25 % IJ SOLN
INTRAMUSCULAR | Status: AC
  Filled 2017-02-04: qty 30

## 2017-02-04 MED ORDER — ATORVASTATIN CALCIUM 20 MG PO TABS
20.0000 mg | ORAL_TABLET | Freq: Every day | ORAL | Status: DC
  Administered 2017-02-04: 20 mg via ORAL
  Filled 2017-02-04: qty 1

## 2017-02-04 MED ORDER — LACTATED RINGERS IV SOLN: INTRAVENOUS | Status: DC

## 2017-02-04 MED ORDER — FENTANYL CITRATE (PF) 100 MCG/2ML IJ SOLN
25.0000 ug | INTRAMUSCULAR | Status: DC | PRN
  Administered 2017-02-04 (×3): 50 ug via INTRAVENOUS

## 2017-02-04 MED ORDER — FENTANYL CITRATE (PF) 100 MCG/2ML IJ SOLN
INTRAMUSCULAR | Status: AC
  Filled 2017-02-04: qty 2

## 2017-02-04 MED ORDER — SUGAMMADEX SODIUM 200 MG/2ML IV SOLN
INTRAVENOUS | Status: AC
  Filled 2017-02-04: qty 2

## 2017-02-04 MED ORDER — EPINEPHRINE PF 1 MG/ML IJ SOLN
INTRAMUSCULAR | Status: AC
  Filled 2017-02-04: qty 1

## 2017-02-04 MED ORDER — DEXAMETHASONE SODIUM PHOSPHATE 10 MG/ML IJ SOLN
INTRAMUSCULAR | Status: DC | PRN
  Administered 2017-02-04: 10 mg via INTRAVENOUS

## 2017-02-04 MED ORDER — METOCLOPRAMIDE HCL 5 MG/ML IJ SOLN: 10.0000 mg | Freq: Once | INTRAMUSCULAR | Status: DC | PRN

## 2017-02-04 MED ORDER — MENTHOL 3 MG MT LOZG: 1.0000 | LOZENGE | OROMUCOSAL | Status: DC | PRN

## 2017-02-04 MED ORDER — MEPERIDINE HCL 25 MG/ML IJ SOLN: 6.2500 mg | INTRAMUSCULAR | Status: DC | PRN

## 2017-02-04 MED ORDER — PHENOL 1.4 % MT LIQD
1.0000 | OROMUCOSAL | Status: DC | PRN
  Administered 2017-02-05: 1 via OROMUCOSAL
  Filled 2017-02-04: qty 177

## 2017-02-04 MED ORDER — BUPIVACAINE HCL (PF) 0.25 % IJ SOLN
INTRAMUSCULAR | Status: DC | PRN
  Administered 2017-02-04: 5 mL

## 2017-02-04 MED ORDER — PHENYLEPHRINE HCL 10 MG/ML IJ SOLN
INTRAMUSCULAR | Status: DC | PRN
  Administered 2017-02-04 (×2): 120 ug via INTRAVENOUS
  Administered 2017-02-04: 80 ug via INTRAVENOUS

## 2017-02-04 MED ORDER — EPINEPHRINE PF 1 MG/ML IJ SOLN
INTRAMUSCULAR | Status: DC | PRN
  Administered 2017-02-04: .15 mL

## 2017-02-04 MED ORDER — CEFAZOLIN SODIUM-DEXTROSE 2-4 GM/100ML-% IV SOLN
INTRAVENOUS | Status: AC
  Filled 2017-02-04: qty 100

## 2017-02-04 MED ORDER — PHENYLEPHRINE HCL 10 MG/ML IJ SOLN
INTRAVENOUS | Status: DC | PRN
  Administered 2017-02-04: 20 ug/min via INTRAVENOUS

## 2017-02-04 MED ORDER — LIDOCAINE 2% (20 MG/ML) 5 ML SYRINGE
INTRAMUSCULAR | Status: DC | PRN
  Administered 2017-02-04: 100 mg via INTRAVENOUS

## 2017-02-04 MED ORDER — SUGAMMADEX SODIUM 200 MG/2ML IV SOLN
INTRAVENOUS | Status: DC | PRN
  Administered 2017-02-04: 160 mg via INTRAVENOUS

## 2017-02-04 MED ORDER — CEFAZOLIN SODIUM-DEXTROSE 1-4 GM/50ML-% IV SOLN
1.0000 g | Freq: Three times a day (TID) | INTRAVENOUS | Status: AC
  Administered 2017-02-04 – 2017-02-05 (×2): 1 g via INTRAVENOUS
  Filled 2017-02-04 (×2): qty 50

## 2017-02-04 MED ORDER — MIDAZOLAM HCL 2 MG/2ML IJ SOLN
INTRAMUSCULAR | Status: DC | PRN
  Administered 2017-02-04: 2 mg via INTRAVENOUS

## 2017-02-04 MED ORDER — OXYCODONE HCL 5 MG PO TABS
5.0000 mg | ORAL_TABLET | ORAL | Status: DC | PRN
  Administered 2017-02-04 (×2): 5 mg via ORAL
  Administered 2017-02-05 (×2): 10 mg via ORAL
  Filled 2017-02-04 (×3): qty 2

## 2017-02-04 MED ORDER — ACETAMINOPHEN 325 MG PO TABS: 650.0000 mg | ORAL_TABLET | ORAL | Status: DC | PRN

## 2017-02-04 MED ORDER — POLYETHYLENE GLYCOL 3350 17 G PO PACK: 17.0000 g | PACK | Freq: Every day | ORAL | Status: DC

## 2017-02-04 MED ORDER — METHOCARBAMOL 1000 MG/10ML IJ SOLN
500.0000 mg | Freq: Four times a day (QID) | INTRAVENOUS | Status: DC | PRN
  Filled 2017-02-04 (×2): qty 5

## 2017-02-04 MED ORDER — ACETAMINOPHEN 650 MG RE SUPP: 650.0000 mg | RECTAL | Status: DC | PRN

## 2017-02-04 MED ORDER — SODIUM CHLORIDE 0.9% FLUSH: 3.0000 mL | INTRAVENOUS | Status: DC | PRN

## 2017-02-04 MED ORDER — SODIUM CHLORIDE 0.9% FLUSH
3.0000 mL | Freq: Two times a day (BID) | INTRAVENOUS | Status: DC
  Administered 2017-02-04: 3 mL via INTRAVENOUS

## 2017-02-04 MED ORDER — ONDANSETRON HCL 4 MG PO TABS: 4.0000 mg | ORAL_TABLET | Freq: Four times a day (QID) | ORAL | Status: DC | PRN

## 2017-02-04 MED ORDER — ROCURONIUM BROMIDE 10 MG/ML (PF) SYRINGE
PREFILLED_SYRINGE | INTRAVENOUS | Status: DC | PRN
  Administered 2017-02-04: 10 mg via INTRAVENOUS
  Administered 2017-02-04: 50 mg via INTRAVENOUS
  Administered 2017-02-04: 10 mg via INTRAVENOUS
  Administered 2017-02-04 (×2): 20 mg via INTRAVENOUS

## 2017-02-04 MED ORDER — FENTANYL CITRATE (PF) 250 MCG/5ML IJ SOLN
INTRAMUSCULAR | Status: AC
  Filled 2017-02-04: qty 5

## 2017-02-04 MED ORDER — CEFAZOLIN SODIUM-DEXTROSE 2-4 GM/100ML-% IV SOLN
2.0000 g | INTRAVENOUS | Status: AC
Start: 2017-02-04 — End: 2017-02-04
  Administered 2017-02-04: 2 g via INTRAVENOUS

## 2017-02-04 MED ORDER — LACTATED RINGERS IV SOLN
INTRAVENOUS | Status: DC | PRN
  Administered 2017-02-04 (×2): via INTRAVENOUS

## 2017-02-04 MED ORDER — SODIUM CHLORIDE 0.9 % IV SOLN: INTRAVENOUS | Status: DC

## 2017-02-04 MED ORDER — FLUOXETINE HCL 20 MG PO CAPS
40.0000 mg | ORAL_CAPSULE | Freq: Every day | ORAL | Status: DC
Start: 2017-02-05 — End: 2017-02-05

## 2017-02-04 MED ORDER — SODIUM CHLORIDE 0.9 % IV SOLN: 250.0000 mL | INTRAVENOUS | Status: DC

## 2017-02-04 MED ORDER — DOCUSATE SODIUM 100 MG PO CAPS
100.0000 mg | ORAL_CAPSULE | Freq: Two times a day (BID) | ORAL | Status: DC
  Administered 2017-02-04: 100 mg via ORAL
  Filled 2017-02-04: qty 1

## 2017-02-04 MED ORDER — MORPHINE SULFATE (PF) 4 MG/ML IV SOLN
2.0000 mg | INTRAVENOUS | Status: DC | PRN
Start: 2017-02-04 — End: 2017-02-05
  Administered 2017-02-04: 3 mg via INTRAVENOUS
  Filled 2017-02-04: qty 1

## 2017-02-04 MED ORDER — ONDANSETRON HCL 4 MG/2ML IJ SOLN
INTRAMUSCULAR | Status: AC
  Filled 2017-02-04: qty 4

## 2017-02-04 MED ORDER — PROPOFOL 10 MG/ML IV BOLUS
INTRAVENOUS | Status: DC | PRN
  Administered 2017-02-04: 200 mg via INTRAVENOUS

## 2017-02-04 MED ORDER — ONDANSETRON HCL 4 MG/2ML IJ SOLN: 4.0000 mg | Freq: Four times a day (QID) | INTRAMUSCULAR | Status: DC | PRN

## 2017-02-04 MED ORDER — MIDAZOLAM HCL 2 MG/2ML IJ SOLN
INTRAMUSCULAR | Status: AC
  Filled 2017-02-04: qty 2

## 2017-02-04 MED ORDER — ONDANSETRON HCL 4 MG/2ML IJ SOLN
INTRAMUSCULAR | Status: DC | PRN
  Administered 2017-02-04: 4 mg via INTRAVENOUS

## 2017-02-04 MED ORDER — METHOCARBAMOL 500 MG PO TABS
500.0000 mg | ORAL_TABLET | Freq: Four times a day (QID) | ORAL | Status: DC | PRN
  Administered 2017-02-04 – 2017-02-05 (×3): 500 mg via ORAL
  Filled 2017-02-04 (×2): qty 1

## 2017-02-04 MED ORDER — FENTANYL CITRATE (PF) 250 MCG/5ML IJ SOLN
INTRAMUSCULAR | Status: DC | PRN
  Administered 2017-02-04 (×2): 50 ug via INTRAVENOUS
  Administered 2017-02-04: 150 ug via INTRAVENOUS
  Administered 2017-02-04 (×2): 50 ug via INTRAVENOUS

## 2017-02-04 MED ORDER — OXYCODONE HCL 5 MG PO TABS
ORAL_TABLET | ORAL | Status: AC
  Filled 2017-02-04: qty 1

## 2017-02-04 MED ORDER — METHOCARBAMOL 500 MG PO TABS
ORAL_TABLET | ORAL | Status: AC
  Filled 2017-02-04: qty 1

## 2017-02-04 MED ORDER — 0.9 % SODIUM CHLORIDE (POUR BTL) OPTIME
TOPICAL | Status: DC | PRN
  Administered 2017-02-04: 1000 mL

## 2017-02-04 SURGICAL SUPPLY — 56 items
BENZOIN TINCTURE PRP APPL 2/3 (GAUZE/BANDAGES/DRESSINGS) ×2 IMPLANT
BIT DRILL 16 (BIT) ×2 IMPLANT
BIT DRILL SRG 14X2.2XFLT CHK (BIT) ×1 IMPLANT
BIT DRL SRG 14X2.2XFLT CHK (BIT) ×1
BLADE CLIPPER SURG (BLADE) IMPLANT
BONE CERV LORDOTIC 14.5X12X7 (Bone Implant) ×2 IMPLANT
BONE VIVIGEN FORMABLE 1.3CC (Bone Implant) ×2 IMPLANT
BUR ROUND FLUTED 4 SOFT TCH (BURR) ×2 IMPLANT
CLSR STERI-STRIP ANTIMIC 1/2X4 (GAUZE/BANDAGES/DRESSINGS) ×2 IMPLANT
COLLAR CERV LO CONTOUR FIRM DE (SOFTGOODS) ×2 IMPLANT
CORDS BIPOLAR (ELECTRODE) ×2 IMPLANT
COVER MAYO STAND STRL (DRAPES) ×10 IMPLANT
COVER SURGICAL LIGHT HANDLE (MISCELLANEOUS) ×2 IMPLANT
CRADLE DONUT ADULT HEAD (MISCELLANEOUS) ×2 IMPLANT
DRAPE C-ARM 42X72 X-RAY (DRAPES) ×2 IMPLANT
DRAPE HALF SHEET 40X57 (DRAPES) ×2 IMPLANT
DRAPE MICROSCOPE LEICA (MISCELLANEOUS) ×2 IMPLANT
DRILL BIT SKYLINE 14MM (BIT) ×1
DURAPREP 6ML APPLICATOR 50/CS (WOUND CARE) ×2 IMPLANT
ELECT COATED BLADE 2.86 ST (ELECTRODE) ×2 IMPLANT
ELECT REM PT RETURN 9FT ADLT (ELECTROSURGICAL) ×2
ELECTRODE REM PT RTRN 9FT ADLT (ELECTROSURGICAL) ×1 IMPLANT
EVACUATOR 1/8 PVC DRAIN (DRAIN) ×2 IMPLANT
GAUZE SPONGE 4X4 12PLY STRL (GAUZE/BANDAGES/DRESSINGS) IMPLANT
GAUZE SPONGE 4X4 12PLY STRL LF (GAUZE/BANDAGES/DRESSINGS) ×2 IMPLANT
GLOVE BIOGEL PI IND STRL 8 (GLOVE) ×2 IMPLANT
GLOVE BIOGEL PI INDICATOR 8 (GLOVE) ×2
GLOVE ORTHO TXT STRL SZ7.5 (GLOVE) ×4 IMPLANT
GOWN STRL REUS W/ TWL LRG LVL3 (GOWN DISPOSABLE) IMPLANT
GOWN STRL REUS W/ TWL XL LVL3 (GOWN DISPOSABLE) ×1 IMPLANT
GOWN STRL REUS W/TWL 2XL LVL3 (GOWN DISPOSABLE) ×2 IMPLANT
GOWN STRL REUS W/TWL LRG LVL3 (GOWN DISPOSABLE)
GOWN STRL REUS W/TWL XL LVL3 (GOWN DISPOSABLE) ×1
HEAD HALTER (SOFTGOODS) ×2 IMPLANT
HEMOSTAT SURGICEL 2X14 (HEMOSTASIS) IMPLANT
KIT BASIN OR (CUSTOM PROCEDURE TRAY) ×2 IMPLANT
KIT ROOM TURNOVER OR (KITS) IMPLANT
MANIFOLD NEPTUNE II (INSTRUMENTS) IMPLANT
NEEDLE 25GX 5/8IN NON SAFETY (NEEDLE) ×2 IMPLANT
NS IRRIG 1000ML POUR BTL (IV SOLUTION) ×2 IMPLANT
PACK ORTHO CERVICAL (CUSTOM PROCEDURE TRAY) ×2 IMPLANT
PAD ARMBOARD 7.5X6 YLW CONV (MISCELLANEOUS) ×4 IMPLANT
PATTIES SURGICAL .5 X.5 (GAUZE/BANDAGES/DRESSINGS) IMPLANT
PEEK LORDOTIC 8MM (Peek) ×2 IMPLANT
PLATE SKYLINE 12MM (Plate) ×2 IMPLANT
RESTRAINT LIMB HOLDER UNIV (RESTRAINTS) IMPLANT
SCREW SELF DRILLING 16MM (Screw) ×4 IMPLANT
SCREW VAR SELF TAP SKYLINE 14M (Screw) ×8 IMPLANT
STRIP CLOSURE SKIN 1/2X4 (GAUZE/BANDAGES/DRESSINGS) IMPLANT
SURGIFLO W/THROMBIN 8M KIT (HEMOSTASIS) IMPLANT
SUT BONE WAX W31G (SUTURE) ×2 IMPLANT
SUT VIC AB 3-0 X1 27 (SUTURE) ×2 IMPLANT
SUT VICRYL 4-0 PS2 18IN ABS (SUTURE) ×2 IMPLANT
TOWEL OR 17X24 6PK STRL BLUE (TOWEL DISPOSABLE) ×2 IMPLANT
TOWEL OR 17X26 10 PK STRL BLUE (TOWEL DISPOSABLE) ×2 IMPLANT
TRAY FOLEY CATH SILVER 16FR (SET/KITS/TRAYS/PACK) IMPLANT

## 2017-02-04 NOTE — Anesthesia Procedure Notes (Signed)
Procedure Name: Intubation Date/Time: 02/04/2017 12:41 PM Performed by: Burt EkURNER, Canio Winokur ASHLEY Pre-anesthesia Checklist: Patient identified, Emergency Drugs available, Suction available and Patient being monitored Patient Re-evaluated:Patient Re-evaluated prior to inductionOxygen Delivery Method: Circle system utilized Preoxygenation: Pre-oxygenation with 100% oxygen Intubation Type: IV induction Ventilation: Mask ventilation without difficulty Laryngoscope Size: Glidescope and 4 Grade View: Grade I Tube type: Oral Tube size: 7.5 mm Number of attempts: 1 Airway Equipment and Method: Stylet and Video-laryngoscopy Placement Confirmation: ETT inserted through vocal cords under direct vision,  positive ETCO2 and breath sounds checked- equal and bilateral Secured at: 24 cm Tube secured with: Tape Dental Injury: Teeth and Oropharynx as per pre-operative assessment

## 2017-02-04 NOTE — Anesthesia Preprocedure Evaluation (Signed)
Anesthesia Evaluation  Patient identified by MRN, date of birth, ID band Patient awake    Reviewed: Allergy & Precautions, NPO status , Patient's Chart, lab work & pertinent test results  History of Anesthesia Complications (+) PONV  Airway Mallampati: II  TM Distance: >3 FB Neck ROM: Full    Dental no notable dental hx.    Pulmonary Current Smoker,    Pulmonary exam normal breath sounds clear to auscultation       Cardiovascular negative cardio ROS Normal cardiovascular exam Rhythm:Regular Rate:Normal     Neuro/Psych Radiculopathy Paresthesias left arm and left leg negative psych ROS   GI/Hepatic negative GI ROS, Neg liver ROS,   Endo/Other  negative endocrine ROS  Renal/GU negative Renal ROS  negative genitourinary   Musculoskeletal negative musculoskeletal ROS (+)   Abdominal   Peds negative pediatric ROS (+)  Hematology negative hematology ROS (+)   Anesthesia Other Findings   Reproductive/Obstetrics negative OB ROS                            Anesthesia Physical Anesthesia Plan  ASA: II  Anesthesia Plan: General   Post-op Pain Management:    Induction: Intravenous  Airway Management Planned: Oral ETT  Additional Equipment:   Intra-op Plan:   Post-operative Plan: Extubation in OR  Informed Consent: I have reviewed the patients History and Physical, chart, labs and discussed the procedure including the risks, benefits and alternatives for the proposed anesthesia with the patient or authorized representative who has indicated his/her understanding and acceptance.   Dental advisory given  Plan Discussed with: CRNA  Anesthesia Plan Comments:         Anesthesia Quick Evaluation

## 2017-02-04 NOTE — Progress Notes (Signed)
Orthopedic Tech Progress Note Patient Details:  Tyler Craig May 12, 1971 [id number]  Ortho Devices Type of Ortho Device: Soft collar Ortho Device/Splint Location: bedside Ortho Device/Splint Interventions: Casandra DoffingOrdered   [name] 02/04/2017, 6:15 PM

## 2017-02-04 NOTE — Anesthesia Postprocedure Evaluation (Signed)
Anesthesia Post Note  Patient: Tyler Craig  Procedure(s) Performed: Procedure(s) (LRB): C4-5, C5-6 Anterior Cervical Discectomy and Fusion, Allograft and Plate at Z6-1C4-5 and Zero P at C5-6 (N/A)  Patient location during evaluation: PACU Anesthesia Type: General Level of consciousness: awake and alert, patient cooperative and oriented Pain management: pain level controlled Vital Signs Assessment: post-procedure vital signs reviewed and stable Respiratory status: spontaneous breathing, nonlabored ventilation and respiratory function stable Cardiovascular status: blood pressure returned to baseline and stable Postop Assessment: no signs of nausea or vomiting Anesthetic complications: no       Last Vitals:  Vitals:   02/04/17 1645 02/04/17 1711  BP: (!) 143/94   Pulse: 92   Resp: 15   Temp:  36.7 C    Last Pain:  Vitals:   02/04/17 1621  TempSrc:   PainSc: 6                  [name],E. Winni Ehrhard

## 2017-02-04 NOTE — H&P (Signed)
Tyler Craig is an 46 y.o. male.   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  Progressively worsening symptoms.  Failed conservative treatment.    Past Medical History:  Diagnosis Date  . Anxiety    takes Fluoxetine daily  . Hyperlipidemia    takes Atorvastatin daily  . Muscle spasm    takes Flexeril as needed  . Numbness    left arm and left leg.Tingling as well  . PONV (postoperative nausea and vomiting)     Past Surgical History:  Procedure Laterality Date  . EYE SURGERY     left eye artificial lens placed  . HERNIA REPAIR    . SPINE SURGERY     neck surgery- has plate    Family History  Problem Relation Age of Onset  . Hypertension Mother   . Cancer Father 2265       colon cancer  . Heart disease Maternal Uncle 48       massive MI  . Heart disease Maternal Grandfather    Social History:  reports that he has been smoking Cigarettes.  He has a 44.00 pack-year smoking history. He has never used smokeless tobacco. He reports that he drinks alcohol. He reports that he has current or past drug history about 1 time per week.  Allergies:  Allergies  Allergen Reactions  . No Known Allergies     No prescriptions prior to admission.    No results found for this or any previous visit (from the past 48 hour(s)). No results found.  Review of Systems  Constitutional: Negative.   HENT: Negative.   Eyes: Negative.   Respiratory: Negative.   Cardiovascular: Negative.   Gastrointestinal: Negative.   Genitourinary: Negative.   Musculoskeletal: Positive for neck pain.  Skin: Negative.   Neurological: Positive for tingling.  Psychiatric/Behavioral: Negative.     There were no vitals taken for this visit. Physical Exam  Constitutional: He is oriented to person, place, and time. He appears well-developed. No distress.  HENT:  Head:  Normocephalic and atraumatic.  Eyes: EOM are normal. Pupils are equal, round, and reactive to light.  Respiratory: No respiratory distress.  GI: He exhibits no distension.  Musculoskeletal:  Patient has increased pain with cervical compression some relief with distraction. Brachial plexus tenderness both right and left worse on the right side. He has some deltoid atrophy worse on the right than left. Slight weakness biceps wrist extension. Good triceps strength. Phalen's test at the wrist carpal compression over the canal is negative. Median nerve rolled her nerve of forearm are normal. 1+ biceps triceps brachioradialis. 2+ knee and ankle jerk. No clonus. Negative straight leg raising right and left. He has some sciatic notch tenderness both right and left. Anterior tib EHL gastrocsoleus is strong he can heel and toe walk. Quads hip flexors are strong. Distal pulses are normal.   Lymphadenopathy:    He has no cervical adenopathy.  Neurological: He is alert and oriented to person, place, and time.  Skin: Skin is warm and dry.  Psychiatric: He has a normal mood and affect.     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  Surgical procedure along with possible recovery time discussed.  All questions answered and  wishes to proceed.   Zonia Kief, PA-C 02/04/2017, 8:36 AM

## 2017-02-04 NOTE — Op Note (Signed)
Preop diagnosis: Cervical spinal stenosis C4-5, C5-6  ( with previous fusion at C6-7 with previous plate)  Postop diagnosis: Same  Procedure:    #1.    C4-5 anterior cervical discectomy and fusion, allograft and plate.                     #2.    C5-6 interbody Zero P biomechanical device with           intervertebral    anterior instrumentation with screws, anterior cervical fusion                    ( 2 level fusion with one level allograft and plate in the second level biomechanical device with screws due to previous procedure)  Surgeon: Annell Greening M.D.  Assistant: Zonia Kief PA-C medically necessary and present for the entire procedure  Anesthesia: Gen. oral tracheal +5 mL Marcaine skin local  Drains: One Hemovac neck  Implants: Zero P  Depuy 8 mm biomechanical cage with16 mm  screws at C5-6 level. Skyline 12 mm plate, 7 mm allograft and 14 mm screws at C4-5 level.  Complications: None.  Brief history: 46 year old male had previous C6-7 fusion through a low transverse left-sided neck incision approximately 8-10 years ago. He was injured in March 2008 lifting a garage door with onset of neck shoulder pain failed conservative treatment with gradual loss of arm strength with  radiculopahy. He been treated with  prednisone pack, anti-inflammatories chronic narcotic medication, muscle relaxants without relief. 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 Cervical MRI showed disc protrusion at C5-6 flattening of the cord with spinal stenosis and right-sided disc protrusion consistent with revision radiculopathy. He also had disc bulging with mild to moderate stenosis at C4-5 flattening of cord at the C4-5 level. Because the patient had a plate at Z6-1,   biomechanical device with screws and bone graft was placed at the adjacent level due to the proximity of the old plate and interference with not enough room to put a plate at the  W9-6 level. The C4-5 level had standard allograft placement and plating during this procedure.  Procedure: After induction general anesthesia operculum intubation preoperative antibiotics timeout procedure neck was prepped with DuraPrep had halter traction was applied without weight arm abducted sides with yellow foam protecting the ulnar nerve. Distraction was applied to be used for distraction for visualization and fluoroscopic  C-arm pictures were used during the surgery. The area was prepped was squared with towels. The old incision which was just above the clavicle was outlined with a skin marker and the plan incision was drawn starting at the midline extending to the left and a prominent skin fold midway between the plan to levels of fusion at C4-5 and C5-6. Betadine Steri-Drape was applied thyroid sheets and drapes with a sterile Mayo stand at the head. Incision was made starting the midline platysma was divided. Blunt dissection above the omohyoid was performed. Longus Coley were retracted from midline. Carotid sheath and contents were lateral and trachea and esophagus were midline medial. Large spur at C5-6 was noted and at the very inferior aspect of the incision the top the plate was visualized. Short needle was placed and C5-6 confirmed with a lateral x-ray. Self-retaining retractors were placed. Discectomy was performed using operative microscope taken off the anterior spurs removing the disc space taking the protruding disc  and bone spur ridges directly off the cord decompressing the cord whether spinal stenosis. Undercutting of the endplates was performed with 1 and 2 mm Kerrisons. Complete posterior longitudinal ligament was performed all disc material was removed uncovertebral joints were stripped with the Clark curettes. Sizing with the Zero P trial sizers was performed and an 8 mm implant gave nice snug fit. Bone Vivigen 1.3 cc from [facility] [place] tissue bank was reconstituted and prepared in  standard fashion. It was then packed into the cage and with C-arm for localization and CRNA pulling on the had halter traction the cage was implanted and checked under C-arm. It was in the midline on AP view and was in excellent position on lateral. 16mm screws were then placed 2 securing it to the adjacent vertebral body.  Self-retaining retractors were then moved up one level C4-5 discectomy was performed. Microscope was used for taking down the posterior longitudinal ligament decompression of the dura removal of bone spurs uncovertebral stripping and then trial sizing and a 7 mm lordotic cervical allograft from [facility] [place] tissue bank was selected. It was countersunk 2 mm. Epidural space was dry there was room on the sides. Aggressive any fluids. Skyline plate was selected 12 mm screws were placed checked under C-arm and then finally lockdown with a tiny screwdriver for the locking screws. Cervical lordosis was maintained good position of graft, cage, plates and all screws. Copious irrigation was performed. Hemovac was placed and out technique on the left of the cervical incision. Drain was cut to this was repaired with 3-0 Vicryl for Vicryl subcuticular closure tincture benzoin Steri-Strips Conlan Miceli infiltration postop dressing and soft cervical collar was applied. Patient was transferred recovery room was neurologically intact upper and lower extremities and had good relief of his preop arm pain with slight improvement in his arm strength noted in the recovery room.

## 2017-02-04 NOTE — Brief Op Note (Signed)
02/04/2017  3:06 PM  PATIENT:  Tyler Craig  46 y.o. male  PRE-OPERATIVE DIAGNOSIS:  Cervical Four-Five, Cervical Five-Six Spondylosis  POST-OPERATIVE DIAGNOSIS:  Cervical Four-Five, Cervical Five-Six Spondylosis  PROCEDURE:  Procedure(s): C4-5, C5-6 Anterior Cervical Discectomy and Fusion, Allograft and Plate at Y8-6C4-5 and Zero P at C5-6 (N/A)  SURGEON:  Surgeon(s) and Role:    * Eldred MangesYates, Mark C, MD - Primary  PHYSICIAN ASSISTANT: Zonia Kiefjames Jniyah Dantuono    ANESTHESIA:   general  EBL:  Total I/O In: -  Out: 30 [Blood:30]  BLOOD ADMINISTERED:none  DRAINS: hemovac  LOCAL MEDICATIONS USED:  MARCAINE     SPECIMEN:  No Specimen  DISPOSITION OF SPECIMEN:  N/A  COUNTS:  YES  TOURNIQUET:  * No tourniquets in log *  DICTATION: .Dragon Dictation  PLAN OF CARE: Admit for overnight observation  PATIENT DISPOSITION:  PACU - hemodynamically stable.

## 2017-02-04 NOTE — Transfer of Care (Signed)
Immediate Anesthesia Transfer of Care Note  Patient: Tyler Craig  Procedure(s) Performed: Procedure(s): C4-5, C5-6 Anterior Cervical Discectomy and Fusion, Allograft and Plate at Z6-1C4-5 and Zero P at C5-6 (N/A)  Patient Location: PACU  Anesthesia Type:General  Level of Consciousness: awake, alert , oriented and patient cooperative  Airway & Oxygen Therapy: Patient Spontanous Breathing  Post-op Assessment: Report given to RN, Post -op Vital signs reviewed and stable and Patient moving all extremities X 4  Post vital signs: Reviewed and stable  Last Vitals:  Vitals:   02/04/17 1032 02/04/17 1511  BP: (!) 153/84   Resp: 20   Temp: 37 C (P) 36.8 C    Last Pain:  Vitals:   02/04/17 1043  TempSrc:   PainSc: 5       Patients Stated Pain Goal: 5 (02/04/17 1043)  Complications: No apparent anesthesia complications

## 2017-02-04 NOTE — Interval H&P Note (Signed)
History and Physical Interval Note:  02/04/2017 12:23 PM  Tyler Craig  has presented today for surgery, with the diagnosis of C4-5, C5-6 Spondylosis  The various methods of treatment have been discussed with the patient and family. After consideration of risks, benefits and other options for treatment, the patient has consented to  Procedure(s): C4-5, C5-6 Anterior Cervical Discectomy and Fusion, Allograft and Plate at K4-4C4-5 and Zero P at C5-6 (N/A) as a surgical intervention .  The patient's history has been reviewed, patient examined, no change in status, stable for surgery.  I have reviewed the patient's chart and labs.  Questions were answered to the patient's satisfaction.     Eldred MangesMark C Terron Merfeld

## 2017-02-05 ENCOUNTER — Encounter (HOSPITAL_COMMUNITY): Payer: Self-pay | Admitting: Orthopaedic Surgery

## 2017-02-05 DIAGNOSIS — M4802 Spinal stenosis, cervical region: Secondary | ICD-10-CM | POA: Diagnosis not present

## 2017-02-05 DIAGNOSIS — F17210 Nicotine dependence, cigarettes, uncomplicated: Secondary | ICD-10-CM | POA: Diagnosis not present

## 2017-02-05 DIAGNOSIS — Z981 Arthrodesis status: Secondary | ICD-10-CM | POA: Diagnosis not present

## 2017-02-05 DIAGNOSIS — M5412 Radiculopathy, cervical region: Secondary | ICD-10-CM | POA: Diagnosis not present

## 2017-02-05 DIAGNOSIS — F419 Anxiety disorder, unspecified: Secondary | ICD-10-CM | POA: Diagnosis not present

## 2017-02-05 DIAGNOSIS — E785 Hyperlipidemia, unspecified: Secondary | ICD-10-CM | POA: Diagnosis not present

## 2017-02-05 MED ORDER — HYDROCODONE-ACETAMINOPHEN 5-325 MG PO TABS: 1.0000 | ORAL_TABLET | Freq: Four times a day (QID) | ORAL | 0 refills | Status: DC | PRN

## 2017-02-05 NOTE — Progress Notes (Signed)
Pt doing well. Pt's incision is clean and dry with no sign of infection. Pt's Hemovac and IV were removed prior to D/C. Pt and wife given D/C instructions with Rx's, verbal understanding was provided. Pt D/C'd home via walking @ 0855 per MD order. Pt is stable @ D/C and has no other needs at this time. Rema FendtAshley Linna Thebeau, RN

## 2017-02-05 NOTE — Progress Notes (Signed)
   Subjective: 1 Day Post-Op Procedure(s) (LRB): C4-5, C5-6 Anterior Cervical Discectomy and Fusion, Allograft and Plate at W0-9C4-5 and Zero P at C5-6 (N/A) Patient reports pain as mild.    Objective: Vital signs in last 24 hours: Temp:  [98 F (36.7 C)-98.9 F (37.2 C)] 98.1 F (36.7 C) (05/22 0412) Pulse Rate:  [83-96] 87 (05/22 0412) Resp:  [10-20] 18 (05/22 0412) BP: (130-153)/(58-102) 150/94 (05/22 0412) SpO2:  [95 %-100 %] 97 % (05/22 0412) Weight:  [181 lb 1.6 oz (82.1 kg)] 181 lb 1.6 oz (82.1 kg) (05/21 1032)  Intake/Output from previous day: 05/21 0701 - 05/22 0700 In: 1530 [P.O.:480; I.V.:1000; IV Piggyback:50] Out: 30 [Blood:30] Intake/Output this shift: No intake/output data recorded.  No results for input(s): HGB in the last 72 hours. No results for input(s): WBC, RBC, HCT, PLT in the last 72 hours. No results for input(s): NA, K, CL, CO2, BUN, CREATININE, GLUCOSE, CALCIUM in the last 72 hours. No results for input(s): LABPT, INR in the last 72 hours.  Neurologically intact Dg Cervical Spine 2-3 Views  Result Date: 02/04/2017 CLINICAL DATA:  C4-5 and C5-6 ACDF EXAM: CERVICAL SPINE - 2-3 VIEW; DG C-ARM 61-120 MIN COMPARISON:  Preoperative MRI 11/28/2016 FINDINGS: Interval C4-5 and C5-6 anterior cervical discectomy. Hardware is in expected position. No malalignment. IMPRESSION: Fluoroscopy for C4-5 and C5-6 ACDF.  No acute finding. Electronically Signed   By: Marnee SpringJonathon  Watts M.D.   On: 02/04/2017 15:16   Dg C-arm 1-60 Min  Result Date: 02/04/2017 CLINICAL DATA:  C4-5 and C5-6 ACDF EXAM: CERVICAL SPINE - 2-3 VIEW; DG C-ARM 61-120 MIN COMPARISON:  Preoperative MRI 11/28/2016 FINDINGS: Interval C4-5 and C5-6 anterior cervical discectomy. Hardware is in expected position. No malalignment. IMPRESSION: Fluoroscopy for C4-5 and C5-6 ACDF.  No acute finding. Electronically Signed   By: Marnee SpringJonathon  Watts M.D.   On: 02/04/2017 15:16    Assessment/Plan: 1 Day Post-Op Procedure(s)  (LRB): C4-5, C5-6 Anterior Cervical Discectomy and Fusion, Allograft and Plate at W1-1C4-5 and Zero P at C5-6 (N/A) Plan :  Discharge home. Office one week . Extra collar for shower. Eldred MangesMark C Yates 02/05/2017, 7:35 AM

## 2017-02-14 ENCOUNTER — Ambulatory Visit (INDEPENDENT_AMBULATORY_CARE_PROVIDER_SITE_OTHER): Payer: BLUE CROSS/BLUE SHIELD

## 2017-02-14 ENCOUNTER — Encounter (INDEPENDENT_AMBULATORY_CARE_PROVIDER_SITE_OTHER): Payer: Self-pay | Admitting: Orthopaedic Surgery

## 2017-02-14 ENCOUNTER — Ambulatory Visit (INDEPENDENT_AMBULATORY_CARE_PROVIDER_SITE_OTHER): Payer: BLUE CROSS/BLUE SHIELD | Admitting: Orthopaedic Surgery

## 2017-02-14 DIAGNOSIS — M4802 Spinal stenosis, cervical region: Secondary | ICD-10-CM | POA: Diagnosis not present

## 2017-02-14 NOTE — Progress Notes (Signed)
   Post-Op Visit Note   Patient: Tyler Craig           Date of Birth: Sep 05, 1971           MRN: [id number] Visit Date: 02/14/2017 PCP: Salley Scarleturham, Kawanta F, MD   Assessment & Plan: Post C5-6 cervical fusion. He has some residual mild discomfort between his scapula. He's 10 days postop Steri-Strips changed. He will return in 5 weeks with lateral flexion-extension x-ray on return  Chief Complaint:  Chief Complaint  Patient presents with  . Neck - Routine Post Op   Visit Diagnoses:  1. Cervical spinal stenosis     Plan: Return 5 weeks flexion-extension lateral C-spine x-ray on return. I reviewed today's x-rays with him. He is neurologically intact.  Follow-Up Instructions: Return in about 5 weeks (around 03/21/2017).   Orders:  Orders Placed This Encounter  Procedures  . XR Cervical Spine 2 or 3 views   No orders of the defined types were placed in this encounter.   Imaging: Xr Cervical Spine 2 Or 3 Views  Result Date: 02/14/2017 AP lateral C-spine x-rays obtained. This shows the interbody cage with oblique screws at C5-6. No evidence of loosening good position and alignment. Patient's had previous C4-5 ACDF and C6-7 ACDF. Impression: Satisfactory C5-6 recent surgery good position of cage and screws.   PMFS History: Patient Active Problem List   Diagnosis Date Noted  . Cervical spinal stenosis 02/04/2017  . DDD (degenerative disc disease), cervical 12/12/2016  . DDD (degenerative disc disease), lumbar 12/12/2016  . 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 Benign paroxysmal positional vertigo 08/03/2016  . Hyperlipidemia 11/16/2014  . Insomnia 07/05/2014  . Elbow pain 02/09/2014  . MDD (major depressive disorder) 12/29/2013  . GAD (generalized anxiety disorder) 12/29/2013  . Smoker 12/29/2013   Past Medical History:  Diagnosis Date  . Anxiety    takes Fluoxetine daily  . Hyperlipidemia    takes Atorvastatin daily  . Muscle spasm    takes Flexeril as needed    . Numbness    left arm and left leg.Tingling as well  . PONV (postoperative nausea and vomiting)     Family History  Problem Relation Age of Onset  . Hypertension Mother   . Cancer Father [id number]       colon cancer  . Heart disease Maternal Uncle 48       massive MI  . Heart disease Maternal Grandfather     Past Surgical History:  Procedure Laterality Date  . ANTERIOR CERVICAL DECOMP/DISCECTOMY FUSION N/A 02/04/2017   Procedure: C4-5, C5-6 Anterior Cervical Discectomy and Fusion, Allograft and Plate at W0-9C4-5 and Zero P at C5-6;  Surgeon: Eldred MangesYates, Elba Dendinger C, MD;  [facility];  Service: Orthopedics;  Laterality: N/A;  . EYE SURGERY     left eye artificial lens placed  . HERNIA REPAIR    . SPINE SURGERY     neck surgery- has plate   Social History   Occupational History  . Not on file.   Social History Main Topics  . Smoking status: Current Every Day Smoker    Packs/day: 1.00    Years: 44.00    Types: Cigarettes  . Smokeless tobacco: Never Used  . Alcohol use Yes     Comment: occasionally  . Drug use: Unknown    Frequency: 1.0 time per week  . Sexual activity: Yes

## 2017-02-21 NOTE — Discharge Summary (Signed)
Patient ID: Tyler Craig MRN: [id number] DOB/AGE: 10/30/1970 46 y.o.  Admit date: 02/04/2017 Discharge date: 02/21/2017  Admission Diagnoses:  Active Problems:   Cervical spinal stenosis   Discharge Diagnoses:  Active Problems:   Cervical spinal stenosis  status post Procedure(s): C4-5, C5-6 Anterior Cervical Discectomy and Fusion, Allograft and Plate at N8-2 and Zero P at C5-6  Past Medical History:  Diagnosis Date  . Anxiety    takes Fluoxetine daily  . Hyperlipidemia    takes Atorvastatin daily  . Muscle spasm    takes Flexeril as needed  . Numbness    left arm and left leg.Tingling as well  . PONV (postoperative nausea and vomiting)     Surgeries: Procedure(s): C4-5, C5-6 Anterior Cervical Discectomy and Fusion, Allograft and Plate at N5-6 and Zero P at C5-6 on 02/04/2017   Consultants:   Discharged Condition: Improved  Hospital Course: Tyler Craig is an 46 y.o. male who was admitted 02/04/2017 for operative treatment of cervical stenosis. Patient failed conservative treatments (please see the history and physical for the specifics) and had severe unremitting pain that affects sleep, daily activities and work/hobbies. After pre-op clearance, the patient was taken to the operating room on 02/04/2017 and underwent  Procedure(s): C4-5, C5-6 Anterior Cervical Discectomy and Fusion, Allograft and Plate at O1-3 and Zero P at C5-6.    Patient was given perioperative antibiotics:  Anti-infectives    Start     Dose/Rate Route Frequency Ordered Stop   02/04/17 2000  ceFAZolin (ANCEF) IVPB 1 g/50 mL premix     1 g 100 mL/hr over 30 Minutes Intravenous Every 8 hours 02/04/17 1739 02/05/17 0630   02/04/17 1031  ceFAZolin (ANCEF) 2-4 GM/100ML-% IVPB    Comments:  Posey Pronto   : cabinet override      02/04/17 1031 02/04/17 1246   02/04/17 1030  ceFAZolin (ANCEF) IVPB 2g/100 mL premix     2 g 200 mL/hr over 30 Minutes Intravenous On call to O.R. 02/04/17 1030 02/04/17  1256       Patient was given sequential compression devices and early ambulation to prevent DVT.   Patient benefited maximally from hospital stay and there were no complications. At the time of discharge, the patient was urinating/moving their bowels without difficulty, tolerating a regular diet, pain is controlled with oral pain medications and they have been cleared by PT/OT.   Recent vital signs: No data found.    Recent laboratory studies: No results for input(s): WBC, HGB, HCT, PLT, NA, K, CL, CO2, BUN, CREATININE, GLUCOSE, INR, CALCIUM in the last 72 hours.  Invalid input(s): PT, 2   Discharge Medications:   Allergies as of 02/05/2017      Reactions   No Known Allergies       Medication List    STOP taking these medications   cyclobenzaprine 10 MG tablet Commonly known as:  FLEXERIL     TAKE these medications   atorvastatin 20 MG tablet Commonly known as:  LIPITOR Take 1 tablet (20 mg total) by mouth daily.   calcium carbonate 500 MG chewable tablet Commonly known as:  TUMS - dosed in mg elemental calcium Chew 3 tablets by mouth 2 (two) times daily as needed for indigestion or heartburn.   FLUoxetine 40 MG capsule Commonly known as:  PROZAC Take 1 capsule (40 mg total) by mouth daily.   HYDROcodone-acetaminophen 5-325 MG tablet Commonly known as:  NORCO Take 1-2 tablets by mouth every 6 (six)  hours as needed for moderate pain. What changed:  how much to take  when to take this  Another medication with the same name was removed. Continue taking this medication, and follow the directions you see here.   meclizine 25 MG tablet Commonly known as:  ANTIVERT Take 1 tablet (25 mg total) by mouth 3 (three) times daily as needed for dizziness.   methylPREDNISolone 4 MG Tbpk tablet Commonly known as:  MEDROL DOSEPAK Take as directed on package       Diagnostic Studies: Dg Cervical Spine 2-3 Views  Result Date: 02/04/2017 CLINICAL DATA:  C4-5 and C5-6 ACDF  EXAM: CERVICAL SPINE - 2-3 VIEW; DG C-ARM 61-120 MIN COMPARISON:  Preoperative MRI 11/28/2016 FINDINGS: Interval C4-5 and C5-6 anterior cervical discectomy. Hardware is in expected position. No malalignment. IMPRESSION: Fluoroscopy for C4-5 and C5-6 ACDF.  No acute finding. Electronically Signed   By: Marnee SpringJonathon  Watts M.D.   On: 02/04/2017 15:16   Dg C-arm 1-60 Min  Result Date: 02/04/2017 CLINICAL DATA:  C4-5 and C5-6 ACDF EXAM: CERVICAL SPINE - 2-3 VIEW; DG C-ARM 61-120 MIN COMPARISON:  Preoperative MRI 11/28/2016 FINDINGS: Interval C4-5 and C5-6 anterior cervical discectomy. Hardware is in expected position. No malalignment. IMPRESSION: Fluoroscopy for C4-5 and C5-6 ACDF.  No acute finding. Electronically Signed   By: Marnee SpringJonathon  Watts M.D.   On: 02/04/2017 15:16   Xr Cervical Spine 2 Or 3 Views  Result Date: 02/14/2017 AP lateral C-spine x-rays obtained. This shows the interbody cage with oblique screws at C5-6. No evidence of loosening good position and alignment. Patient's had previous C4-5 ACDF and C6-7 ACDF. Impression: Satisfactory C5-6 recent surgery good position of cage and screws.       Discharge Plan:  discharge to home  Disposition:     Signed: Zonia KiefJames Owens  02/21/2017, 5:01 AM

## 2017-03-21 ENCOUNTER — Ambulatory Visit (INDEPENDENT_AMBULATORY_CARE_PROVIDER_SITE_OTHER): Payer: BLUE CROSS/BLUE SHIELD | Admitting: Orthopaedic Surgery

## 2017-03-21 ENCOUNTER — Ambulatory Visit (INDEPENDENT_AMBULATORY_CARE_PROVIDER_SITE_OTHER): Payer: BLUE CROSS/BLUE SHIELD

## 2017-03-21 VITALS — BP 134/90 | HR 90 | Ht 70.0 in | Wt 185.0 lb

## 2017-03-21 DIAGNOSIS — Z981 Arthrodesis status: Secondary | ICD-10-CM

## 2017-03-21 NOTE — Progress Notes (Signed)
Post-Op Visit Note   Patient: Tyler Craig           Date of Birth: 1971/07/24           MRN: [id number] Visit Date: 03/21/2017 PCP: Salley Scarlet, MD   Assessment & Plan: Post 2 level cervical fusion. C4-5 level with allograft and plate is solid. The C5-6 level which has a Zero P implant with cage still has a few millimeters of motion and is not solid. He's got good relief of his arm pain and neck pain that was present preop. Return 2 weeks and repeat lateral flexion-extension x-ray. Work slip given no work 2 weeks.  Chief Complaint:  Chief Complaint  Patient presents with  . Neck - Routine Post Op   Visit Diagnoses:  1. Status post cervical spinal fusion     Plan: Two-level cervical fusion with the C5-6 level showing some residual motion. He will stay in a collar I will recheck him in 2 weeks. Work slip given no work 2 weeks.  Follow-Up Instructions: Return in about 2 weeks (around 04/04/2017).   Orders:  Orders Placed This Encounter  Procedures  . XR Cervical Spine 2 or 3 views   No orders of the defined types were placed in this encounter.   Imaging: Xr Cervical Spine 2 Or 3 Views  Result Date: 03/21/2017 AP x-ray and lateral flexion-extension x-rays are obtained. This shows no motion at the upper fused level C4-5. C5-6 level which is the zero P implant still shows a few millimeters of motion and incorporation has lagged behind. Impression: Post C4-5, C5-6 fusion with C4-5 level solid and slight motion at C5-6   PMFS History: Patient Active Problem List   Diagnosis Date Noted  . Cervical spinal stenosis 02/04/2017  . DDD (degenerative disc disease), cervical 12/12/2016  . DDD (degenerative disc disease), lumbar 12/12/2016  . 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 Benign paroxysmal positional vertigo 08/03/2016  . Hyperlipidemia 11/16/2014  . Insomnia 07/05/2014  . Elbow pain 02/09/2014  . MDD (major depressive disorder) 12/29/2013  . GAD  (generalized anxiety disorder) 12/29/2013  . Smoker 12/29/2013   Past Medical History:  Diagnosis Date  . Anxiety    takes Fluoxetine daily  . Hyperlipidemia    takes Atorvastatin daily  . Muscle spasm    takes Flexeril as needed  . Numbness    left arm and left leg.Tingling as well  . PONV (postoperative nausea and vomiting)     Family History  Problem Relation Age of Onset  . Hypertension Mother   . Cancer Father 96       colon cancer  . Heart disease Maternal Uncle 48       massive MI  . Heart disease Maternal Grandfather     Past Surgical History:  Procedure Laterality Date  . ANTERIOR CERVICAL DECOMP/DISCECTOMY FUSION N/A 02/04/2017   Procedure: C4-5, C5-6 Anterior Cervical Discectomy and Fusion, Allograft and Plate at W0-9 and Zero P at C5-6;  Surgeon: Eldred Manges, MD;  Location: [facility] OR;  Service: Orthopedics;  Laterality: N/A;  . EYE SURGERY     left eye artificial lens placed  . HERNIA REPAIR    . SPINE SURGERY     neck surgery- has plate   Social History   Occupational History  . Not on file.   Social History Main Topics  . Smoking status: Current Every Day Smoker    Packs/day: 1.00    Years: 44.00  Types: Cigarettes  . Smokeless tobacco: Never Used  . Alcohol use Yes     Comment: occasionally  . Drug use: Unknown    Frequency: 1.0 time per week  . Sexual activity: Yes

## 2017-04-04 ENCOUNTER — Ambulatory Visit (INDEPENDENT_AMBULATORY_CARE_PROVIDER_SITE_OTHER): Payer: BLUE CROSS/BLUE SHIELD

## 2017-04-04 ENCOUNTER — Encounter (INDEPENDENT_AMBULATORY_CARE_PROVIDER_SITE_OTHER): Payer: Self-pay | Admitting: Orthopaedic Surgery

## 2017-04-04 ENCOUNTER — Ambulatory Visit (INDEPENDENT_AMBULATORY_CARE_PROVIDER_SITE_OTHER): Payer: BLUE CROSS/BLUE SHIELD | Admitting: Orthopaedic Surgery

## 2017-04-04 VITALS — BP 118/84 | HR 85 | Ht 70.0 in | Wt 190.0 lb

## 2017-04-04 DIAGNOSIS — Z981 Arthrodesis status: Secondary | ICD-10-CM | POA: Diagnosis not present

## 2017-04-04 DIAGNOSIS — Z9889 Other specified postprocedural states: Secondary | ICD-10-CM | POA: Insufficient documentation

## 2017-04-04 NOTE — Progress Notes (Signed)
   Post-Op Visit Note   Patient: Tyler Craig           Date of Birth: [birth year]           MRN: [id number] Visit Date: 04/04/2017 PCP: Salley Scarleturham, Kawanta F, MD   Assessment & Plan: Post C4-5, C5-6 two-level cervical fusion with aZero P at C5-6.  Chief Complaint:  Chief Complaint  Patient presents with  . Neck - Routine Post Op   Visit Diagnoses:  1. 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  Follow-Up Instructions: No Follow-up on file.   Orders:  Orders Placed This Encounter  Procedures  . XR Cervical Spine 2 or 3 views   No orders of the defined types were placed in this encounter.   Imaging: No results found.  PMFS History: Patient Active Problem List   Diagnosis Date Noted  . S/P cervical spinal fusion 04/04/2017  . Cervical spinal stenosis 02/04/2017  . DDD (degenerative disc disease), cervical 12/12/2016  . DDD (degenerative disc disease), lumbar 12/12/2016  . 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 Benign paroxysmal positional vertigo 08/03/2016  . Hyperlipidemia 11/16/2014  . Insomnia 07/05/2014  . Elbow pain 02/09/2014  . MDD (major depressive disorder) 12/29/2013  . GAD (generalized anxiety disorder) 12/29/2013  . Smoker 12/29/2013   Past Medical History:  Diagnosis Date  . Anxiety    takes Fluoxetine daily  . Hyperlipidemia    takes Atorvastatin daily  . Muscle spasm    takes Flexeril as needed  . Numbness    left arm and left leg.Tingling as well  . PONV (postoperative nausea and vomiting)     Family History  Problem Relation Age of Onset  . Hypertension Mother   . Cancer Father 3865       colon cancer  . Heart disease Maternal Uncle 48       massive MI  . Heart disease Maternal Grandfather     Past Surgical History:  Procedure Laterality Date  . ANTERIOR CERVICAL  DECOMP/DISCECTOMY FUSION N/A 02/04/2017   Procedure: C4-5, C5-6 Anterior Cervical Discectomy and Fusion, Allograft and Plate at W0-9C4-5 and Zero P at C5-6;  Surgeon: Eldred MangesYates, Sherre Wooton C, MD;  [facility];  Service: Orthopedics;  Laterality: N/A;  . EYE SURGERY     left eye artificial lens placed  . HERNIA REPAIR    . SPINE SURGERY     neck surgery- has plate   Social History   Occupational History  . Not on file.   Social History Main Topics  . Smoking status: Current Every Day Smoker    Packs/day: 1.00    Years: 44.00    Types: Cigarettes  . Smokeless tobacco: Never Used  . Alcohol use Yes     Comment: occasionally  . Drug use: Unknown    Frequency: 1.0 time per week  . Sexual activity: Yes

## 2017-07-17 ENCOUNTER — Other Ambulatory Visit: Payer: Self-pay | Admitting: *Deleted

## 2017-07-17 MED ORDER — FLUOXETINE HCL 40 MG PO CAPS: 40.0000 mg | ORAL_CAPSULE | Freq: Every day | ORAL | 1 refills | Status: DC

## 2017-07-26 ENCOUNTER — Other Ambulatory Visit: Payer: Self-pay | Admitting: *Deleted

## 2017-07-26 MED ORDER — FLUOXETINE HCL 40 MG PO CAPS: 40.0000 mg | ORAL_CAPSULE | Freq: Every day | ORAL | 1 refills | Status: DC

## 2017-07-29 ENCOUNTER — Other Ambulatory Visit: Payer: Self-pay

## 2017-07-29 ENCOUNTER — Ambulatory Visit: Payer: BLUE CROSS/BLUE SHIELD | Admitting: Family Medicine

## 2017-07-29 ENCOUNTER — Encounter: Payer: Self-pay | Admitting: Family Medicine

## 2017-07-29 VITALS — BP 128/80 | HR 76 | Temp 98.2°F | Resp 14 | Ht 70.0 in | Wt 187.0 lb

## 2017-07-29 DIAGNOSIS — E782 Mixed hyperlipidemia: Secondary | ICD-10-CM

## 2017-07-29 DIAGNOSIS — F339 Major depressive disorder, recurrent, unspecified: Secondary | ICD-10-CM

## 2017-07-29 DIAGNOSIS — F411 Generalized anxiety disorder: Secondary | ICD-10-CM

## 2017-07-29 DIAGNOSIS — M51369 Other intervertebral disc degeneration, lumbar region without mention of lumbar back pain or lower extremity pain: Secondary | ICD-10-CM

## 2017-07-29 DIAGNOSIS — M5136 Other intervertebral disc degeneration, lumbar region: Secondary | ICD-10-CM

## 2017-07-29 DIAGNOSIS — Z23 Encounter for immunization: Secondary | ICD-10-CM | POA: Diagnosis not present

## 2017-07-29 DIAGNOSIS — Z981 Arthrodesis status: Secondary | ICD-10-CM

## 2017-07-29 MED ORDER — ATORVASTATIN CALCIUM 20 MG PO TABS: 20.0000 mg | ORAL_TABLET | Freq: Every day | ORAL | 2 refills | Status: DC

## 2017-07-29 MED ORDER — HYDROCODONE-ACETAMINOPHEN 5-325 MG PO TABS
1.0000 | ORAL_TABLET | Freq: Four times a day (QID) | ORAL | 0 refills | Status: DC | PRN
Start: 2017-07-29 — End: 2017-08-16

## 2017-07-29 MED ORDER — FLUOXETINE HCL 40 MG PO CAPS: 40.0000 mg | ORAL_CAPSULE | Freq: Every day | ORAL | 2 refills | Status: DC

## 2017-07-29 NOTE — Assessment & Plan Note (Addendum)
Restart prozac 40mg  daily He has been off for a little less than 2 weeks

## 2017-07-29 NOTE — Assessment & Plan Note (Signed)
Chronic pain syndrome, multiple surgeries Given norco to use for severe pain Decrease goody powders due to SE and potentional GI ulceration/upset

## 2017-07-29 NOTE — Progress Notes (Signed)
   Subjective:    Patient ID: Tyler Craig, male    DOB: [birth year], 46 y.o.   MRN: [id number]  Patient presents for Medication Management (states that he has not had Prozac in 2 weeks)  Pt here to f/u medications, last visit in March 2018.  CHornic  Pan from neck, BC powder up to 3 days   Was on norco 5-325mg  which helped, but no longer prescribed by Dr. Ophelia CharterYates  He was on Prozac 40mg  for GAD/MDD, ran out 2 weeks , mood was controlled on medication  Flu shot due    Review Of Systems:  GEN- denies fatigue, fever, weight loss,weakness, recent illness HEENT- denies eye drainage, change in vision, nasal discharge, CVS- denies chest pain, palpitations RESP- denies SOB, cough, wheeze ABD- denies N/V, change in stools, abd pain GU- denies dysuria, hematuria, dribbling, incontinence MSK- +oint pain, muscle aches, injury Neuro- denies headache, dizziness, syncope, seizure activity       Objective:    BP 128/80   Pulse 76   Temp 98.2 F (36.8 C) (Oral)   Resp 14   Ht 5\' 10"  (1.778 m)   Wt 187 lb (84.8 kg)   SpO2 98%   BMI 26.83 kg/m  GEN- NAD, alert and oriented x3 HEENT- PERRL, EOMI, non injected sclera, pink conjunctiva, MMM, oropharynx clear CVS- RRR, no murmur RESP-CTAB Psych- normal affect and mood Pulses- Radial 2+        Assessment & Plan:      Problem List Items Addressed This Visit      Unprioritized   MDD (major depressive disorder)   Relevant Medications   FLUoxetine (PROZAC) 40 MG capsule   Hyperlipidemia - Primary   Relevant Medications   atorvastatin (LIPITOR) 20 MG tablet   Other Relevant Orders   CBC with Differential/Platelet   Comprehensive metabolic panel   Lipid panel   S/P cervical spinal fusion   GAD (generalized anxiety disorder)    Restart prozac 40mg  daily He has been off for a little less than 2 weeks      Relevant Medications   FLUoxetine (PROZAC) 40 MG capsule   DDD (degenerative disc disease), lumbar    Chronic pain  syndrome, multiple surgeries Given norco to use for severe pain Decrease goody powders due to SE and potentional GI ulceration/upset      Relevant Medications   HYDROcodone-acetaminophen (NORCO) 5-325 MG tablet    Other Visit Diagnoses    Need for immunization against influenza       Relevant Orders   Flu Vaccine QUAD 36+ mos IM (Completed)      Note: This dictation was prepared with Dragon dictation along with smaller phrase technology. Any transcriptional errors that result from this process are unintentional.

## 2017-07-30 LAB — COMPREHENSIVE METABOLIC PANEL
AG RATIO: 1.7 (calc) (ref 1.0–2.5)
ALKALINE PHOSPHATASE (APISO): 65 U/L (ref 40–115)
ALT: 16 U/L (ref 9–46)
AST: 17 U/L (ref 10–40)
Albumin: 4.2 g/dL (ref 3.6–5.1)
BILIRUBIN TOTAL: 0.2 mg/dL (ref 0.2–1.2)
BUN / CREAT RATIO: 10 (calc) (ref 6–22)
BUN: 13 mg/dL (ref 7–25)
CALCIUM: 9.6 mg/dL (ref 8.6–10.3)
CHLORIDE: 105 mmol/L (ref 98–110)
CO2: 31 mmol/L (ref 20–32)
CREATININE: 1.36 mg/dL — AB (ref 0.60–1.35)
GLOBULIN: 2.5 g/dL (ref 1.9–3.7)
Glucose, Bld: 67 mg/dL (ref 65–99)
Potassium: 4.9 mmol/L (ref 3.5–5.3)
Sodium: 140 mmol/L (ref 135–146)
Total Protein: 6.7 g/dL (ref 6.1–8.1)

## 2017-07-30 LAB — CBC WITH DIFFERENTIAL/PLATELET
BASOS ABS: 69 {cells}/uL (ref 0–200)
BASOS PCT: 0.8 %
EOS PCT: 2.6 %
Eosinophils Absolute: 224 cells/uL (ref 15–500)
HCT: 40.4 % (ref 38.5–50.0)
HEMOGLOBIN: 13.9 g/dL (ref 13.2–17.1)
LYMPHS ABS: 2589 {cells}/uL (ref 850–3900)
MCH: 31.7 pg (ref 27.0–33.0)
MCHC: 34.4 g/dL (ref 32.0–36.0)
MCV: 92.2 fL (ref 80.0–100.0)
MONOS PCT: 7.7 %
MPV: 10.9 fL (ref 7.5–12.5)
NEUTROS ABS: 5057 {cells}/uL (ref 1500–7800)
Neutrophils Relative %: 58.8 %
Platelets: 292 10*3/uL (ref 140–400)
RBC: 4.38 10*6/uL (ref 4.20–5.80)
RDW: 13.7 % (ref 11.0–15.0)
Total Lymphocyte: 30.1 %
WBC mixed population: 662 cells/uL (ref 200–950)
WBC: 8.6 10*3/uL (ref 3.8–10.8)

## 2017-07-30 LAB — LIPID PANEL
CHOLESTEROL: 216 mg/dL — AB (ref <200)
HDL: 34 mg/dL — ABNORMAL LOW (ref >40)
LDL CHOLESTEROL (CALC): 148 mg/dL — AB
Non-HDL Cholesterol (Calc): 182 mg/dL (calc) — ABNORMAL HIGH (ref <130)
Total CHOL/HDL Ratio: 6.4 (calc) — ABNORMAL HIGH (ref <5.0)
Triglycerides: 195 mg/dL — ABNORMAL HIGH (ref <150)

## 2017-08-02 ENCOUNTER — Encounter: Payer: Self-pay | Admitting: *Deleted

## 2017-08-02 ENCOUNTER — Other Ambulatory Visit: Payer: Self-pay | Admitting: *Deleted

## 2017-08-02 MED ORDER — ATORVASTATIN CALCIUM 40 MG PO TABS: 40.0000 mg | ORAL_TABLET | Freq: Every day | ORAL | 3 refills | Status: DC

## 2017-08-15 ENCOUNTER — Telehealth: Payer: Self-pay | Admitting: *Deleted

## 2017-08-15 MED ORDER — ATORVASTATIN CALCIUM 40 MG PO TABS: 40.0000 mg | ORAL_TABLET | Freq: Every day | ORAL | 3 refills | Status: DC

## 2017-08-15 NOTE — Telephone Encounter (Signed)
Received call from patient.   Requested refill on Hydrocodone.   Ok to refill??  Last office visit/ refill 07/29/2017.

## 2017-08-15 NOTE — Telephone Encounter (Signed)
Okay to refill, let him know must last 30 days

## 2017-08-16 MED ORDER — HYDROCODONE-ACETAMINOPHEN 5-325 MG PO TABS: 1.0000 | ORAL_TABLET | Freq: Four times a day (QID) | ORAL | 0 refills | Status: DC | PRN

## 2017-08-16 NOTE — Telephone Encounter (Signed)
Prescription printed and patient made aware to come to office to pick up on 11/30/218 after 2 pm.

## 2017-09-16 ENCOUNTER — Other Ambulatory Visit: Payer: Self-pay | Admitting: *Deleted

## 2017-09-16 MED ORDER — HYDROCODONE-ACETAMINOPHEN 5-325 MG PO TABS: 1.0000 | ORAL_TABLET | Freq: Four times a day (QID) | ORAL | 0 refills | Status: DC | PRN

## 2017-09-16 NOTE — Telephone Encounter (Signed)
Received call from patient.   Requested refill on Hydrocodone/APAP.   Ok to refill??  Last office visit 07/29/2017.  Last refill 08/16/2017.

## 2017-10-16 ENCOUNTER — Other Ambulatory Visit: Payer: Self-pay

## 2017-10-16 ENCOUNTER — Ambulatory Visit: Payer: BLUE CROSS/BLUE SHIELD | Admitting: Physician Assistant

## 2017-10-16 ENCOUNTER — Encounter: Payer: Self-pay | Admitting: Physician Assistant

## 2017-10-16 VITALS — BP 118/78 | HR 82 | Temp 98.0°F | Resp 16 | Wt 187.2 lb

## 2017-10-16 DIAGNOSIS — G8929 Other chronic pain: Secondary | ICD-10-CM | POA: Diagnosis not present

## 2017-10-16 DIAGNOSIS — M5126 Other intervertebral disc displacement, lumbar region: Secondary | ICD-10-CM

## 2017-10-16 DIAGNOSIS — M5440 Lumbago with sciatica, unspecified side: Secondary | ICD-10-CM | POA: Diagnosis not present

## 2017-10-16 MED ORDER — METHYLPREDNISOLONE ACETATE 80 MG/ML IJ SUSP
80.0000 mg | Freq: Once | INTRAMUSCULAR | Status: AC
  Administered 2017-10-16: 80 mg via INTRAMUSCULAR

## 2017-10-16 NOTE — Progress Notes (Signed)
Patient ID: Tyler Craig MRN: [id number], DOB: [birth year], 47 y.o. Date of Encounter: 10/16/2017, 4:21 PM    Chief Complaint:  Chief Complaint  Patient presents with  . Back Pain     HPI: 47 y.o. year old male presents with above.   He reports that in the past Depo-Medrol "was the only thing that gave him good relief." He reports that the last time he asked Dr. Jeanice Lim for another Depo-Medrol injection, she "said that it was too soon and gave him some pain pills to use and said to wait to do prednisone again." He states that he does not want to have anymore surgery. Has had surgery to his cervical spine by Dr. Ophelia Charter.  He reports that he has a constant pain in the mid and right low back.  Says that he has some mild numbness and tingling down the left leg.  Says that if he moves a certain way he will feel a "bolt of lightning shoot down the right leg ".  Says that every night at about 3 in the morning he cannot get back to sleep and ends up having to go to the recliner and takes BCs even though he has been told not to do that".  Says that this level of pain has been going on for months and he returns today to see if he can possibly get another Depo-Medrol shot because he's "gotta have some relief.".  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     Home Meds:   Outpatient Medications Prior to Visit  Medication Sig Dispense Refill  . atorvastatin (LIPITOR) 40 MG tablet Take 1 tablet (40 mg total) by mouth daily. 90 tablet 3  . calcium carbonate (TUMS - DOSED IN MG ELEMENTAL CALCIUM) 500 MG chewable tablet Chew 3 tablets by mouth 2 (two) times daily as needed for indigestion or heartburn.    Marland Kitchen FLUoxetine (PROZAC) 40 MG capsule Take 1 capsule (40 mg total) daily by mouth. 90 capsule 2  . HYDROcodone-acetaminophen (NORCO) 5-325 MG tablet  Take 1 tablet by mouth every 6 (six) hours as needed for moderate pain. ** Must Last 30 Days ** Chronic Pain. Dx: G89.4 45 tablet 0   No facility-administered medications prior to visit.     Allergies:  Allergies  Allergen Reactions  . No Known Allergies       Review of Systems: See HPI for pertinent ROS. All other ROS negative.    Physical Exam: Blood pressure 118/78, pulse 82, temperature 98 F (36.7 C), temperature source Oral, resp. rate 16, weight 84.9 kg (187 lb 3.2 oz), SpO2 98 %., Body mass index is 26.86 kg/m. General:  WNWD WM. Appears in no acute distress. Neck: Supple. No thyromegaly. No lymphadenopathy. Lungs: Clear bilaterally to auscultation without wheezes, rales, or rhonchi. Breathing is unlabored. Heart: Regular rhythm. No murmurs, rubs, or gallops. Msk:  Strength and tone normal for age. Has tenderness with palpation along low back around L5 level bilaterally and midline. Extremities/Skin: Warm and dry.  Neuro: Alert and oriented X 3. Moves all extremities spontaneously. Gait is normal. CNII-XII grossly in tact. Psych:  Responds to questions appropriately with a normal affect.     ASSESSMENT AND PLAN:  47 y.o. year old male with  1. Protrusion of lumbar intervertebral disc I  reviewed lumbar spine MRI report from 11/28/16.   This shows: IMPRESSION: 1. Right subarticular disc protrusion at L5-S1, contacting and slightly displacing the transiting S1 nerve root in the right lateral recess. 2. Mild degenerative spondylolysis at L3-4 and L4-5 with resultant mild bilateral foraminal stenosis. 3. Mild degenerative disc bulge at T11-12 with resultant mild bilateral foraminal narrowing. 4. Mild bilateral facet arthrosis at L5-S1.  I reviewed chart.  The last visit that I see that Dr. Jeanice Limurham treated his lumbar spine/low back pain was 12/12/16. At this time I will give another Depo-Medrol 80 mg injection. Today I discussed with him the other option of seeing pain  clinic.  He is agreeable with this approach and is interested in seeing pain clinic for possible treatment options.   - Ambulatory referral to Pain Clinic  2. Chronic low back pain with sciatica, sciatica laterality unspecified, unspecified back pain laterality - Ambulatory referral to Pain Clinic   Signed, [address], [place], [facility] 10/16/2017 4:21 PM

## 2017-10-16 NOTE — Addendum Note (Signed)
Addended by: Phineas SemenJOHNSON, Jenavee Laguardia A on: 10/16/2017 05:01 PM   Modules accepted: Orders

## 2017-10-17 ENCOUNTER — Telehealth: Payer: Self-pay | Admitting: *Deleted

## 2017-10-17 NOTE — Telephone Encounter (Signed)
Call placed to patient lvmtrc 

## 2017-10-17 NOTE — Telephone Encounter (Signed)
Received call from patient.   Inquired as to if refill on Medrol dose pack is appropriate.   Please advise.

## 2017-10-17 NOTE — Telephone Encounter (Signed)
Explain to him that at the visit yesterday we gave him an injection of high dose prednisone.  Do not feel that he needs oral Dosepak in addition to that -- at this time ( he may have been given both in past, but not indicated at this time

## 2017-10-18 ENCOUNTER — Other Ambulatory Visit: Payer: Self-pay | Admitting: *Deleted

## 2017-10-18 MED ORDER — HYDROCODONE-ACETAMINOPHEN 5-325 MG PO TABS: 1.0000 | ORAL_TABLET | Freq: Four times a day (QID) | ORAL | 0 refills | Status: DC | PRN

## 2017-10-18 NOTE — Telephone Encounter (Signed)
Received call from patient.   Requested refill on Hydrocodone/ APAP.   Ok to refill??  Last office visit 10/16/2017.  Last refill 09/16/2017.

## 2017-10-18 NOTE — Addendum Note (Signed)
Addended by: Phillips OdorSIX, Revia Nghiem H on: 10/18/2017 02:30 PM   Modules accepted: Orders

## 2017-10-18 NOTE — Telephone Encounter (Signed)
Call placed to patient to inquire as to preferred pharmacy.   Medication pended again.

## 2017-11-28 ENCOUNTER — Other Ambulatory Visit: Payer: Self-pay | Admitting: *Deleted

## 2017-11-28 NOTE — Telephone Encounter (Signed)
Received call from patient.   Requested refill on Hydrocodone.   Ok to refill??  Last office visit 10/16/2017.  Last refill 10/18/2017.

## 2017-11-29 MED ORDER — HYDROCODONE-ACETAMINOPHEN 5-325 MG PO TABS: 1.0000 | ORAL_TABLET | Freq: Four times a day (QID) | ORAL | 0 refills | Status: DC | PRN

## 2017-12-26 IMAGING — MR MR LUMBAR SPINE W/O CM
4 of 5 series · 15 of 48 positions shown · non-contrast
Comparison: None available.

CLINICAL DATA: Initial evaluation for lower back pain radiating
down left leg for 1 month.

EXAM:
MRI LUMBAR SPINE WITHOUT CONTRAST
TECHNIQUE: Multiplanar, multisequence MR imaging of the lumbar spine was
performed. No intravenous contrast was administered.

[Series 3: T2 · sagittal · 4.0mm · 0.78mm/px · 6 of 15 slices shown (1 of 2)]
[im 1/15]
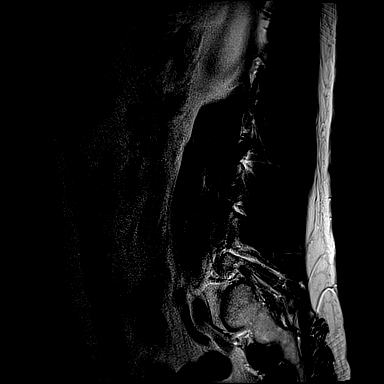
[im 3/15]
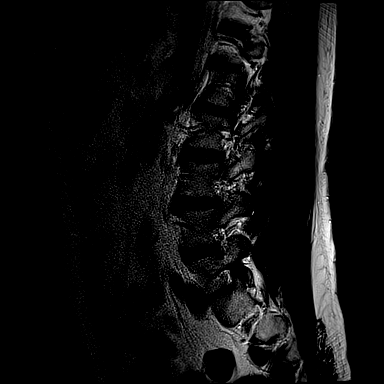
[im 6/15]
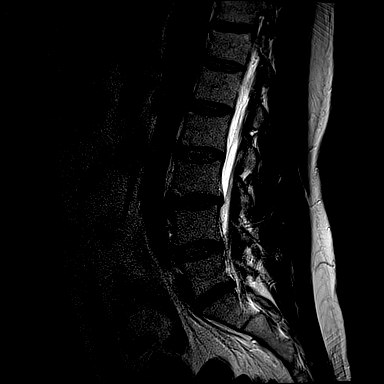
[im 9/15]
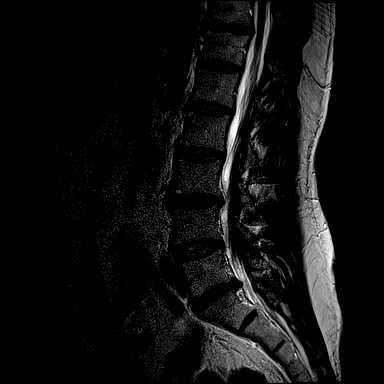
[im 12/15]
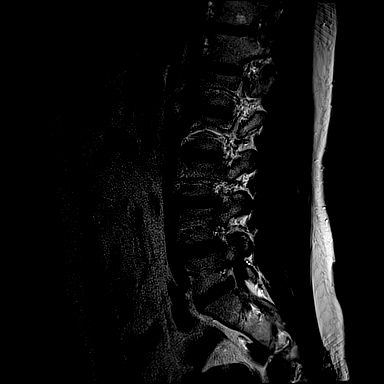
[im 15/15]
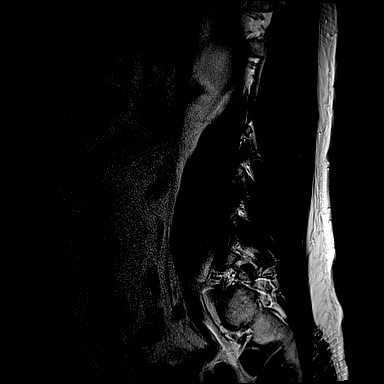

[Series 4: T1 · sagittal · 4.0mm · 0.39mm/px · 3 of 15 slices shown (1 of 2)]
[im 3/15]
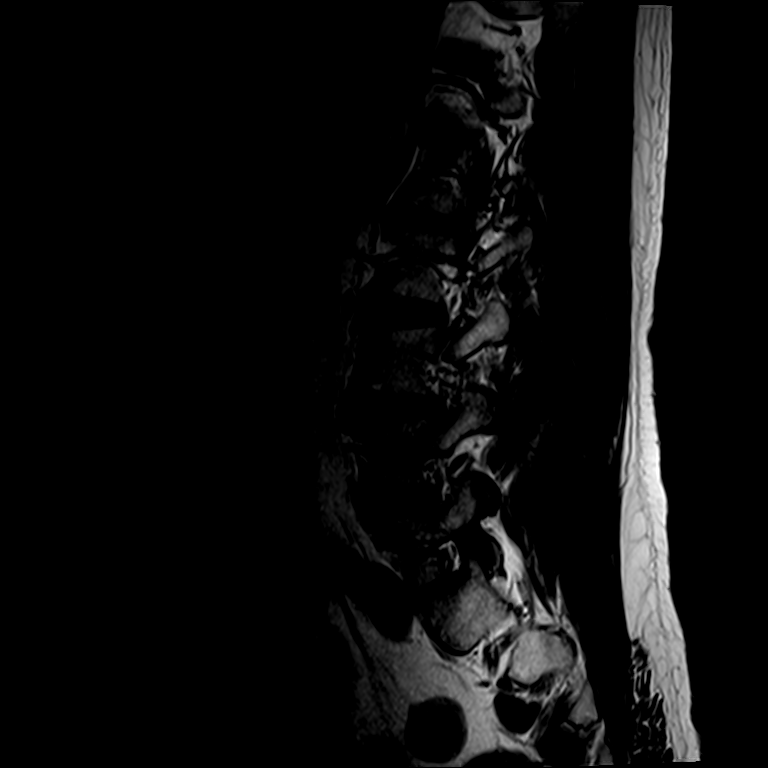
[im 9/15]
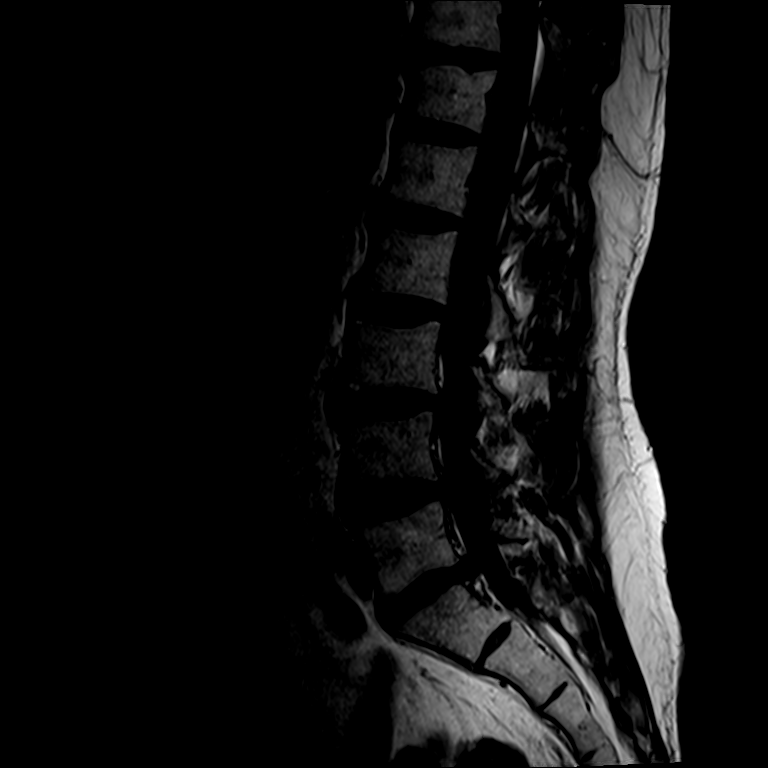
[im 15/15]
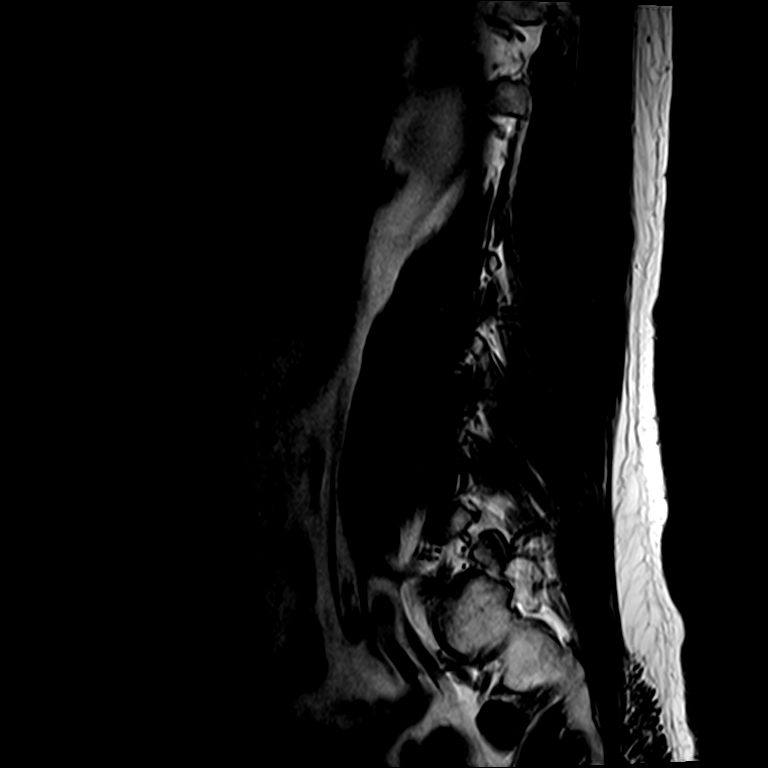

[Series 6: T2 · axial · 4.0mm · 0.23mm/px · z∈[-502,-366]mm · 3 of 37 slices shown (2 of 2)]
[im 6/37]
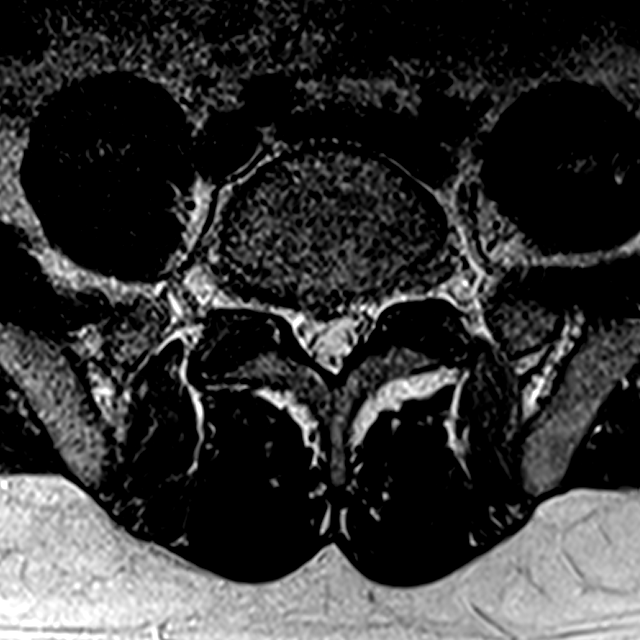
[im 19/37]
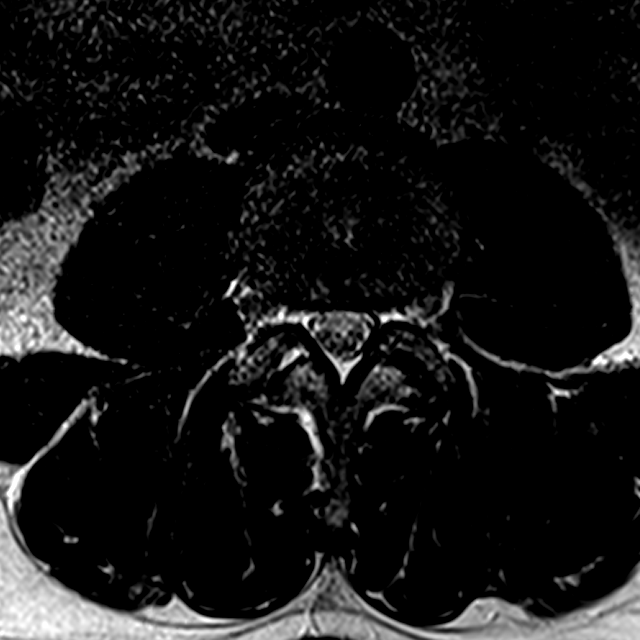
[im 31/37]
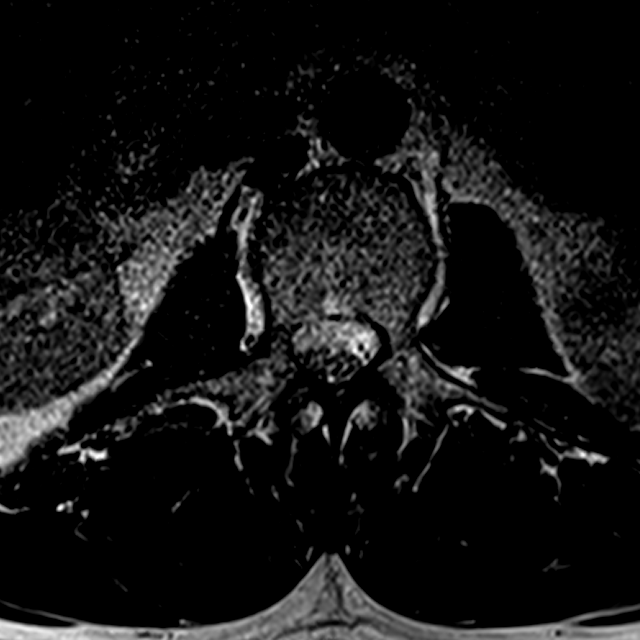

[Series 7: T1 · axial · 4.0mm · 0.23mm/px · z∈[-502,-366]mm · 3 of 37 slices shown (2 of 2)]
[im 6/37]
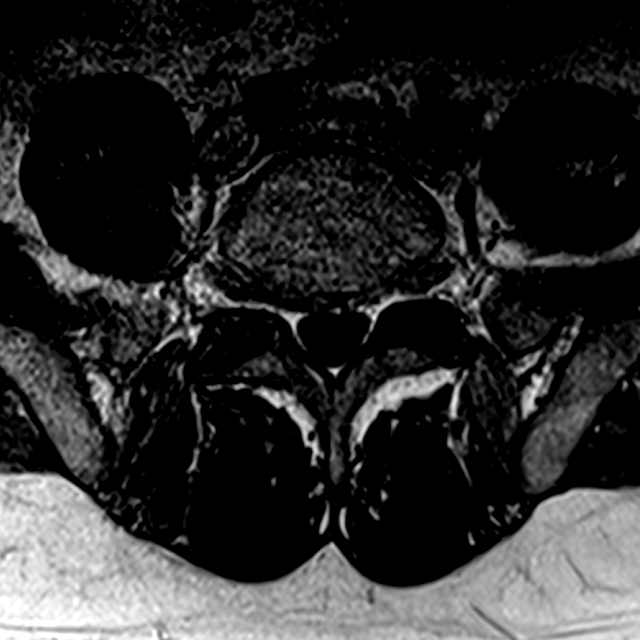
[im 19/37]
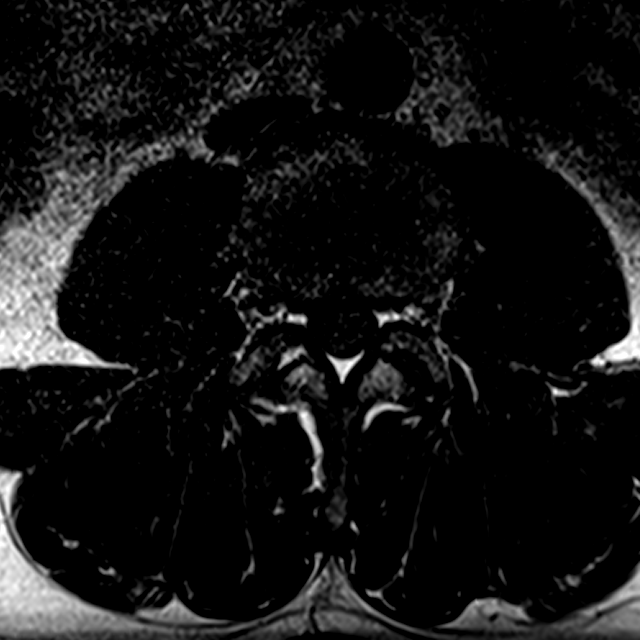
[im 31/37]
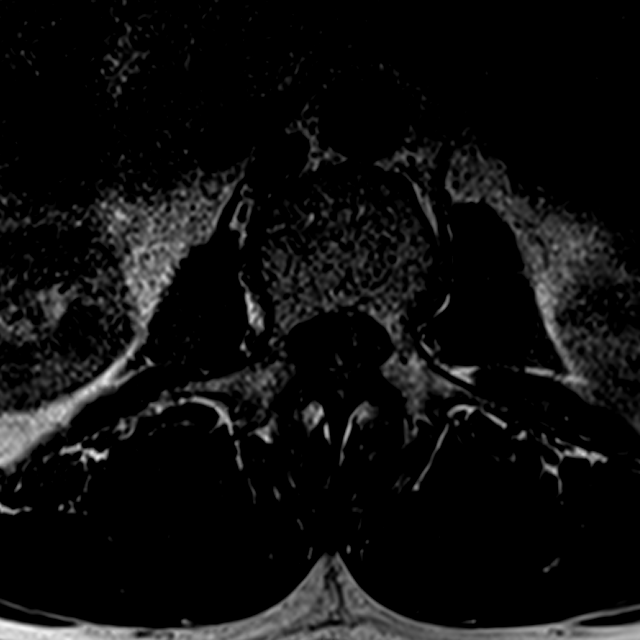

[15 of 48 positions shown; findings below may reference images not displayed]

FINDINGS: Segmentation: Normal segmentation. Lowest well-formed disc is
labeled the L5-S1 level.

Alignment: Vertebral bodies normally aligned with preservation of
the normal lumbar lordosis. No listhesis.

Vertebrae: Vertebral body heights are well maintained. No evidence
for acute or chronic fracture. Signal intensity within the vertebral
body bone marrow is normal. Minimal reactive endplate changes noted
at the superior endplate of T12. No focal osseous lesions.

Conus medullaris: Extends to the L2 level and appears normal.

Paraspinal and other soft tissues: Paraspinous soft tissues within
normal limits. Visualized visceral structures unremarkable.

Disc levels:

T11-12: Diffuse degenerative disc bulge with disc desiccation. Facet
degeneration. No significant canal narrowing. Mild bilateral
foraminal stenosis.

T12-L1:  Unremarkable.

L1-2:  Unremarkable.

L2-3:  Unremarkable.

L3-4: Diffuse circumferential disc bulge with disc desiccation and
mild intervertebral disc space narrowing. Minimal reactive endplate
changes. No significant canal stenosis. Mild bilateral foraminal
narrowing.

L4-5: Minimal annular disc bulge with disc desiccation. No focal
disc protrusion. No significant canal or lateral recess stenosis.
Mild bilateral foraminal narrowing.

L5-S1: Diffuse disc bulge with disc desiccation. Superimposed right
subarticular disc protrusion with slight caudad angulation.
Protruding disc encroaches upon the right lateral recess, abutting
the transiting right S1 nerve root (series 6, image 33). Right S1
nerve root slightly displaced posteriorly. Minimal facet
degeneration. Mild bilateral foraminal stenosis.
IMPRESSION: 1. Right subarticular disc protrusion at L5-S1, contacting and
slightly displacing the transiting S1 nerve root in the right
lateral recess.
2. Mild degenerative spondylolysis at L3-4 and L4-5 with resultant
mild bilateral foraminal stenosis.
3. Mild degenerative disc bulge at T11-12 with resultant mild
bilateral foraminal narrowing.
4. Mild bilateral facet arthrosis at L5-S1.

## 2018-01-01 ENCOUNTER — Other Ambulatory Visit: Payer: Self-pay | Admitting: *Deleted

## 2018-01-01 MED ORDER — HYDROCODONE-ACETAMINOPHEN 5-325 MG PO TABS: 1.0000 | ORAL_TABLET | Freq: Four times a day (QID) | ORAL | 0 refills | Status: DC | PRN

## 2018-01-01 NOTE — Telephone Encounter (Signed)
Received call from patient.   Requested refill on Hydrocodone/APAP.   Ok to refill??  Last office visit 10/16/2017.  Last refill 11/29/2017.

## 2018-01-27 ENCOUNTER — Other Ambulatory Visit: Payer: Self-pay | Admitting: *Deleted

## 2018-01-27 MED ORDER — HYDROCODONE-ACETAMINOPHEN 5-325 MG PO TABS: 1.0000 | ORAL_TABLET | Freq: Four times a day (QID) | ORAL | 0 refills | Status: DC | PRN

## 2018-01-27 NOTE — Telephone Encounter (Signed)
Received call from patient.   Reports that he is going out of town on vacation and is requesting refill on Hydrocodone/APAP. States that he is leaving on 01/30/2018.  Ok to refill??  Last office visit 10/16/2017.  Last refill 01/01/2018.

## 2018-01-27 NOTE — Addendum Note (Signed)
Addended by: Phillips Odor on: 01/27/2018 04:53 PM   Modules accepted: Orders

## 2018-03-04 ENCOUNTER — Other Ambulatory Visit: Payer: Self-pay | Admitting: Family Medicine

## 2018-03-04 ENCOUNTER — Other Ambulatory Visit: Payer: Self-pay | Admitting: *Deleted

## 2018-03-04 IMAGING — RF DG C-ARM 61-120 MIN
1 series · 2 of 2 positions shown · non-contrast
Comparison: Preoperative MRI 11/28/2016

CLINICAL DATA: C4-5 and C5-6 ACDF

EXAM:
CERVICAL SPINE - 2-3 VIEW; DG C-ARM 61-120 MIN

[Series 1: run · 2 of 2 slices shown]
[im 1/2]
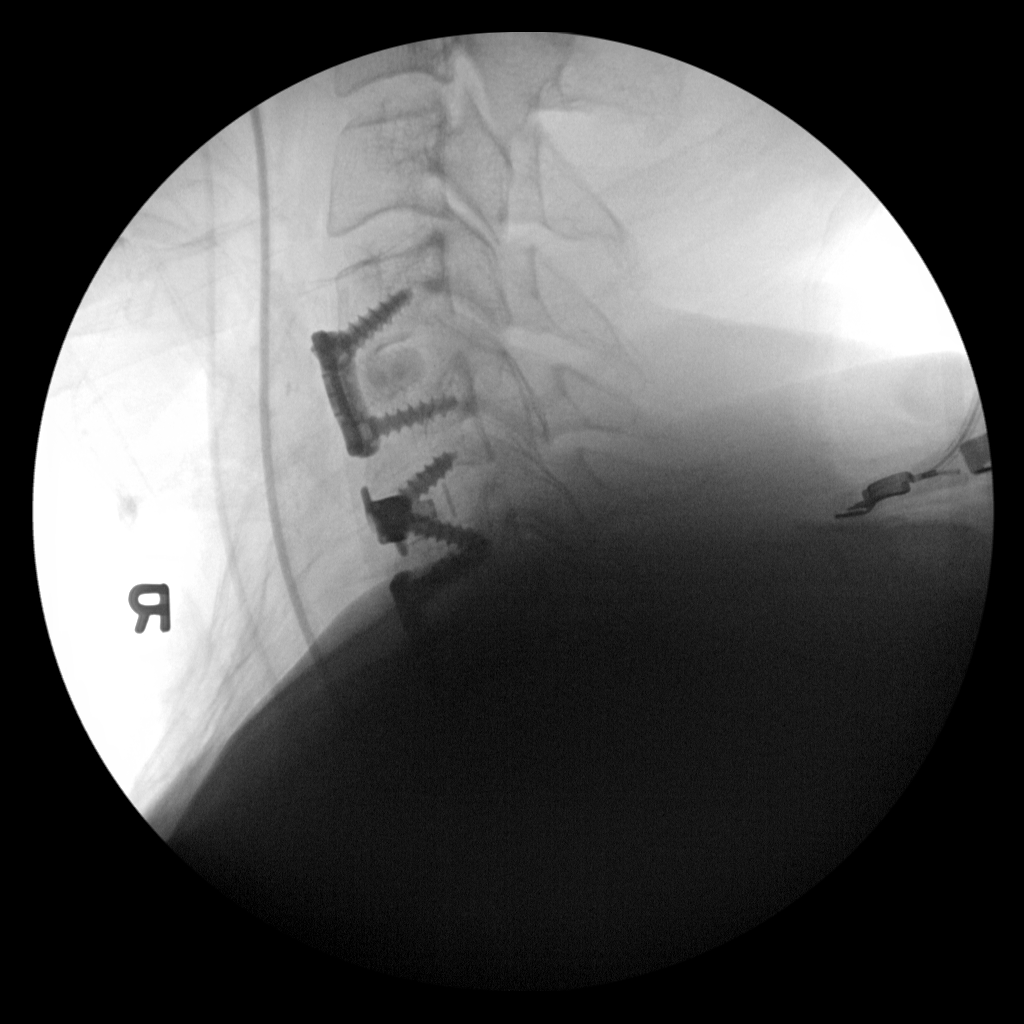
[im 2/2]
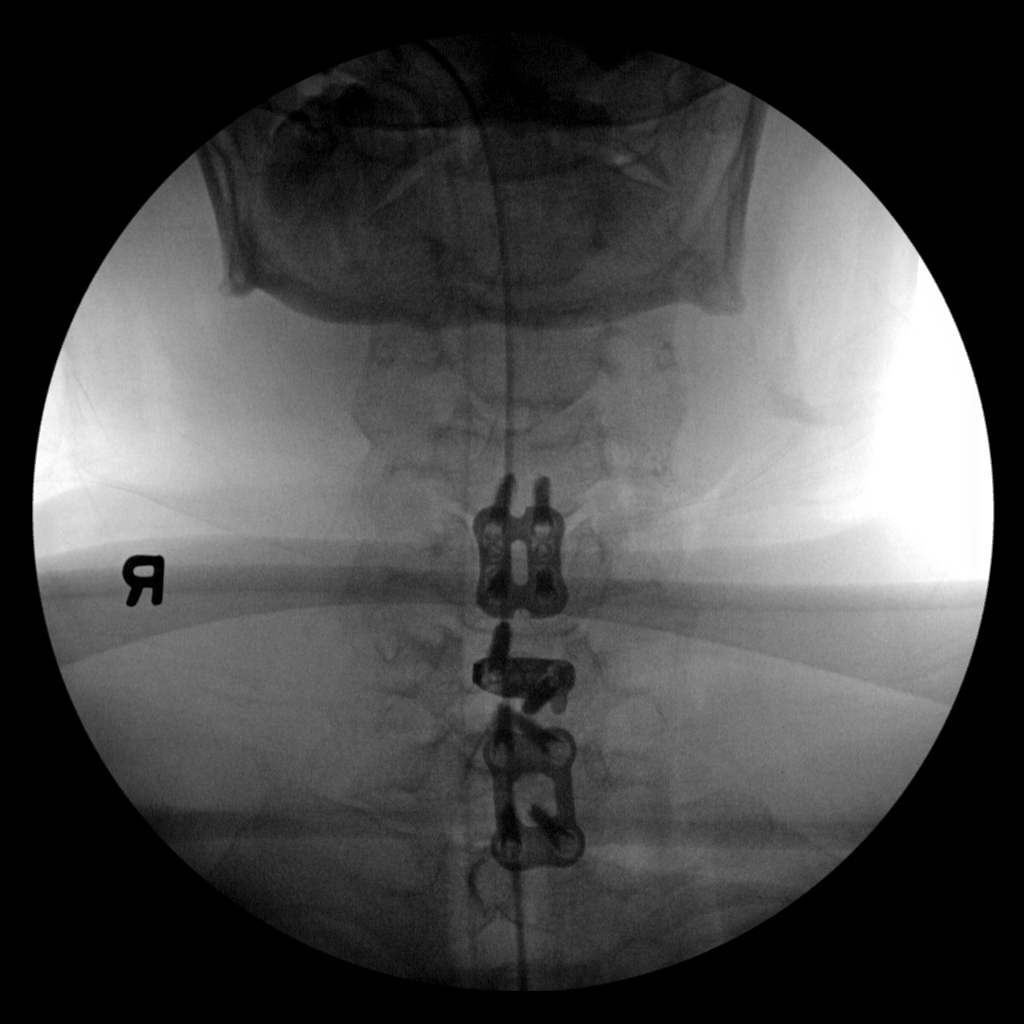

[2 of 2 positions shown; findings below may reference images not displayed]

FINDINGS: Interval C4-5 and C5-6 anterior cervical discectomy. Hardware is in
expected position. No malalignment.
IMPRESSION: Fluoroscopy for C4-5 and C5-6 ACDF.  No acute finding.

## 2018-03-04 MED ORDER — HYDROCODONE-ACETAMINOPHEN 5-325 MG PO TABS: 1.0000 | ORAL_TABLET | Freq: Four times a day (QID) | ORAL | 0 refills | Status: DC | PRN

## 2018-03-04 NOTE — Telephone Encounter (Signed)
Received call from patient.   Requested to refill Hydrocodone/APAP.   Ok to refill??  Last office visit 10/16/2017.  Last refill 01/27/2018.

## 2018-04-07 ENCOUNTER — Other Ambulatory Visit: Payer: Self-pay | Admitting: *Deleted

## 2018-04-07 MED ORDER — HYDROCODONE-ACETAMINOPHEN 5-325 MG PO TABS: 1.0000 | ORAL_TABLET | Freq: Four times a day (QID) | ORAL | 0 refills | Status: DC | PRN

## 2018-04-07 NOTE — Telephone Encounter (Signed)
Received call from patient.   Requested refill on Hydrocodone/APAP.   Ok to refill??  Last office visit 10/16/2017.  Last refill 03/04/2018.

## 2018-04-14 ENCOUNTER — Ambulatory Visit: Payer: Self-pay | Admitting: Family Medicine

## 2018-04-16 ENCOUNTER — Encounter: Payer: Self-pay | Admitting: Family Medicine

## 2018-04-21 ENCOUNTER — Ambulatory Visit: Payer: BLUE CROSS/BLUE SHIELD | Admitting: Family Medicine

## 2018-04-21 ENCOUNTER — Other Ambulatory Visit: Payer: Self-pay

## 2018-04-21 ENCOUNTER — Encounter: Payer: Self-pay | Admitting: Family Medicine

## 2018-04-21 VITALS — BP 122/72 | HR 75 | Temp 98.4°F | Resp 14 | Ht 70.0 in | Wt 182.0 lb

## 2018-04-21 DIAGNOSIS — M5416 Radiculopathy, lumbar region: Secondary | ICD-10-CM | POA: Diagnosis not present

## 2018-04-21 DIAGNOSIS — M5136 Other intervertebral disc degeneration, lumbar region: Secondary | ICD-10-CM | POA: Diagnosis not present

## 2018-04-21 MED ORDER — METHYLPREDNISOLONE ACETATE 80 MG/ML IJ SUSP
80.0000 mg | Freq: Once | INTRAMUSCULAR | Status: AC
  Administered 2018-04-21: 80 mg via INTRAMUSCULAR

## 2018-04-21 MED ORDER — GABAPENTIN 300 MG PO CAPS: 300.0000 mg | ORAL_CAPSULE | Freq: Every day | ORAL | 3 refills | Status: DC

## 2018-04-21 NOTE — Progress Notes (Signed)
Subjective:    Patient ID: Tyler Craig, male    DOB: [birth year], 47 y.o.   MRN: [id number]  Patient presents for Back Pain (increased pain) and Irritability (has been feeling hopeless d/t pain)   Low back pain, worse in the past month, Feels burning sensation in middle of back goes down his right leg, have more frequent episodes. Feels weak all other, feels like the strength is going in his legs. Gets some right heel pain as well, worse in the morning. If walking on foot, pain goes away in his heel.  Cant sleep due to back pain.  No change in bowel or bladder  Now in a new job, so gets some help with the heavy lifiting, repeitive bending stooping  He feels he is more irritable like his Prozac is not been working in the setting of his pain not sleeping well.  He is unable to do back surgery which was recommended by the surgeon before because of finances.  He is looking at other nonsurgical options.  He asked about some stem cell injections.   Lumbar MRI Spine w/o contrast IMPRESSION: 11/28/2016 1. Right subarticular disc protrusion at L5-S1, contacting and slightly displacing the transiting S1 nerve root in the right lateral recess. 2. Mild degenerative spondylolysis at L3-4 and L4-5 with resultant mild bilateral foraminal stenosis. 3. Mild degenerative disc bulge at T11-12 with resultant mild bilateral foraminal narrowing. 4. Mild bilateral facet arthrosis at L5-S1.    Review Of Systems:  GEN- denies fatigue, fever, weight loss,weakness, recent illness HEENT- denies eye drainage, change in vision, nasal discharge, CVS- denies chest pain, palpitations RESP- denies SOB, cough, wheeze ABD- denies N/V, change in stools, abd pain GU- denies dysuria, hematuria, dribbling, incontinence MSK-+ joint pain, muscle aches, injury Neuro- denies headache, dizziness, syncope, seizure activity       Objective:    BP 122/72   Pulse 75   Temp 98.4 F (36.9 C) (Oral)   Resp 14   Ht 5'  10" (1.778 m)   Wt 182 lb (82.6 kg)   SpO2 96%   BMI 26.11 kg/m  GEN- NAD, alert and oriented x3 Psych - normal affect and mood MSK- Decreased ROM spine, TTP lumbar spine and right buttocks, + SLR   Neuro- normal tone, sensation in tact,decreased strength RLE compared to left, antalgic gait  EXT- No edema Pulses- Radial, 2+        Assessment & Plan:      Problem List Items Addressed This Visit      Unprioritized   DDD (degenerative disc disease), lumbar - Primary    He is not concerned with his heel pain want to concentrate on his back pain.  He has known degenerative disc along with nerve impingement disc bulge.  He has been offered surgical intervention but does not want to go this route after he has had neck surgery.  He would like to try other nonsurgical options.  We discussed physical therapy but this is not very feasible with his job.  Discussing him over to go medicine and rehab.  He may benefit from some epidural injections.  I am also going start him on gabapentin 300 mg at night to see if this helps with sleep and his pain I think that would help his irritable mood.  He has hydrocodone to use for severe pain.  He was given Depo-Medrol injection he does feel like this helps.  I believe he is talking about PRP injections I am  not sure if this can be used into the spine but it is using other joints so we will try to find a physical medicine rehab physician that also specializes in this.      Relevant Medications   methylPREDNISolone acetate (DEPO-MEDROL) injection 80 mg (Completed)   Lumbar nerve root impingement   Relevant Medications   gabapentin (NEURONTIN) 300 MG capsule   methylPREDNISolone acetate (DEPO-MEDROL) injection 80 mg (Completed)      Note: This dictation was prepared with Dragon dictation along with smaller phrase technology. Any transcriptional errors that result from this process are unintentional.

## 2018-04-21 NOTE — Patient Instructions (Addendum)
Start gabapentin at bedtime  [place]  Continue the hydrocodone  F/U schedule a physical

## 2018-04-21 NOTE — Assessment & Plan Note (Signed)
He is not concerned with his heel pain want to concentrate on his back pain.  He has known degenerative disc along with nerve impingement disc bulge.  He has been offered surgical intervention but does not want to go this route after he has had neck surgery.  He would like to try other nonsurgical options.  We discussed physical therapy but this is not very feasible with his job.  Discussing him over to go medicine and rehab.  He may benefit from some epidural injections.  I am also going start him on gabapentin 300 mg at night to see if this helps with sleep and his pain I think that would help his irritable mood.  He has hydrocodone to use for severe pain.  He was given Depo-Medrol injection he does feel like this helps.  I believe he is talking about PRP injections I am not sure if this can be used into the spine but it is using other joints so we will try to find a physical medicine rehab physician that also specializes in this.

## 2018-04-25 ENCOUNTER — Other Ambulatory Visit: Payer: Self-pay | Admitting: Family Medicine

## 2018-05-07 ENCOUNTER — Other Ambulatory Visit: Payer: Self-pay | Admitting: *Deleted

## 2018-05-07 MED ORDER — HYDROCODONE-ACETAMINOPHEN 5-325 MG PO TABS: 1.0000 | ORAL_TABLET | Freq: Four times a day (QID) | ORAL | 0 refills | Status: DC | PRN

## 2018-05-07 NOTE — Telephone Encounter (Signed)
Received call from patient.   Requested refill on Hydrocodone/ APAP.   Ok to refill??  Last office visit 04/21/2018.  Last refill 04/07/2018.

## 2018-06-06 ENCOUNTER — Other Ambulatory Visit: Payer: Self-pay | Admitting: *Deleted

## 2018-06-06 MED ORDER — HYDROCODONE-ACETAMINOPHEN 5-325 MG PO TABS: 1.0000 | ORAL_TABLET | Freq: Four times a day (QID) | ORAL | 0 refills | Status: DC | PRN

## 2018-06-06 NOTE — Telephone Encounter (Signed)
Received call from patient.   Requested refill on Hydrocodone/APAP.   Ok to refill??  Last office visit 04/21/2018.  Last refill 05/07/2018.

## 2018-07-07 DIAGNOSIS — M5127 Other intervertebral disc displacement, lumbosacral region: Secondary | ICD-10-CM | POA: Diagnosis not present

## 2018-07-07 DIAGNOSIS — M5417 Radiculopathy, lumbosacral region: Secondary | ICD-10-CM | POA: Diagnosis not present

## 2018-07-07 DIAGNOSIS — M47817 Spondylosis without myelopathy or radiculopathy, lumbosacral region: Secondary | ICD-10-CM | POA: Diagnosis not present

## 2018-07-07 DIAGNOSIS — M545 Low back pain: Secondary | ICD-10-CM | POA: Diagnosis not present

## 2018-07-08 ENCOUNTER — Other Ambulatory Visit: Payer: Self-pay | Admitting: *Deleted

## 2018-07-08 MED ORDER — HYDROCODONE-ACETAMINOPHEN 5-325 MG PO TABS: 1.0000 | ORAL_TABLET | Freq: Four times a day (QID) | ORAL | 0 refills | Status: DC | PRN

## 2018-07-08 NOTE — Telephone Encounter (Signed)
Received call from patient.   Requested refill on Hydrocodone/APAP.   Ok to refill??  Last office visit 04/21/2018.  Last refill 06/06/2018.

## 2018-08-08 ENCOUNTER — Other Ambulatory Visit: Payer: Self-pay | Admitting: *Deleted

## 2018-08-08 MED ORDER — HYDROCODONE-ACETAMINOPHEN 5-325 MG PO TABS: 1.0000 | ORAL_TABLET | Freq: Four times a day (QID) | ORAL | 0 refills | Status: DC | PRN

## 2018-08-08 NOTE — Telephone Encounter (Signed)
Received call from patient.   Requested refill on Hydrocodone/APAP.  Ok to refill??  Last office visit 04/21/2018.  Last refill 07/08/2018.

## 2018-08-11 ENCOUNTER — Other Ambulatory Visit: Payer: Self-pay | Admitting: Family Medicine

## 2018-08-12 ENCOUNTER — Ambulatory Visit: Payer: BLUE CROSS/BLUE SHIELD | Admitting: Family Medicine

## 2018-08-12 ENCOUNTER — Encounter: Payer: Self-pay | Admitting: Family Medicine

## 2018-08-12 VITALS — BP 122/82 | HR 96 | Temp 98.3°F | Resp 16 | Ht 70.0 in | Wt 188.5 lb

## 2018-08-12 DIAGNOSIS — M25461 Effusion, right knee: Secondary | ICD-10-CM

## 2018-08-12 MED ORDER — PREDNISONE 20 MG PO TABS: ORAL_TABLET | ORAL | 0 refills | Status: DC

## 2018-08-12 NOTE — Patient Instructions (Addendum)
I will call you with labs if we need to cover you with antibiotics  For now start antiinflammatory meds and keep compression, ice and rest right knee cap    Prepatellar Bursitis Prepatellar bursitis is inflammation of the prepatellar bursa. The prepatellar bursa is a fluid-filled sac that cushions the kneecap (patella). Prepatellar bursitis happens when fluid builds up in the this sac and causes it to get larger. The condition causes knee pain. What are the causes? This condition may be caused by:  Constant pressure on the knees from kneeling.  A hit to the knee.  Falling on the knee.  A bacterial infection.  Moving the knee often in a forceful way.  What increases the risk? This condition is more likely to develop in:  People who play a sport that involves a risk for falls on the knee or hard hits (blows) to the knee. These sports include: ? Football. ? Wrestling. ? Basketball. ? Soccer.  People who have to kneel for long periods of time, such as roofers, plumbers, and gardeners.  People with another inflammatory condition, such as gout or rheumatoid arthritis.  What are the signs or symptoms? The most common symptom of this condition is knee pain that gets better with rest. Other symptoms include:  Swelling on the front of the kneecap.  Warmth in the knee.  Tenderness with activity.  Redness in the knee.  Inability to bend the knee or to kneel.  How is this diagnosed? This condition may be diagnosed based on:  Your symptoms.  Your medical history.  A physical exam. During the exam, your provider will compare your knees and check for tenderness and pain when moving your knee. Your health care provider may also use a needle to remove fluid from the bursa to help diagnose an infection.  Tests, such as: ? A blood test that checks for infection. ? X-rays. These may be taken to check the structure of the patella. ? MRI or ultrasound. These may be done to check  for swelling and fluid buildup in the bursa.  How is this treated? This condition may be treated by:  Resting the knee.  Applying ice to the knee.  Medicine, such as: ? Nonsteroidal anti-inflammatory drugs (NSAIDs). These can help to reduce pain and swelling. ? Antibiotic medicines. These may be needed if you have an infection. ? Steroid medicines. These may be prescribed if other treatments are not helping.  Raising (elevating) the knee while resting.  Doing strengthening and stretching exercises (physical therapy). These may be recommended after pain and swelling improve.  Having a procedure to remove fluid from the bursa. This may be done if other treatment is not helping.  Having surgery to remove the bursa. This may be done if you have a severe infection or if the condition keeps coming back after treatment.  Follow these instructions at home: Medicines  Take over-the-counter and prescription medicines only as told by your health care provider.  If you were prescribed an antibiotic medicine, take it as told by your health care provider. Do not stop taking the antibiotic even if you start to feel better. Managing pain, stiffness, and swelling  If directed, apply ice to your knee. ? Put ice in a plastic bag. ? Place a towel between your skin and the bag. ? Leave the ice on for 20 minutes, 2-3 times a day.  Elevate your knee above the level of your heart while you are sitting or lying down. Driving  Do not drive or operate heavy machinery while taking prescription pain medicine.  Ask your health care provider when it is safe fpr you to drive. Activity  Rest your knee.  Avoid activities that cause pain.  Return to your normal activities as told by your health care provider. Ask your health care provider what activities are safe for you.  Do exercises as told by your health care provider. General instructions  Do not use the injured limb to support your body weight  until your health care provider says that you can.  Do not use any tobacco products, such as cigarettes, chewing tobacco, and e-cigarettes. Tobacco can delay bone healing. If you need help quitting, ask your health care provider.  Keep all follow-up visits as told by your health care provider. This is important. How is this prevented?  Warm up and stretch before being active.  Cool down and stretch after being active.  Give your body time to rest between periods of activity.  Make sure to use equipment that fits you.  Be safe and responsible while being active to avoid falls.  Do at least 150 minutes of moderate-intensity exercise each week, such as brisk walking or water aerobics.  Maintain physical fitness, including: ? Strength. ? Flexibility. ? Cardiovascular fitness. ? Endurance. Contact a health care provider if:  Your symptoms do not improve.  Your symptoms get worse.  Your symptoms keep coming back after treatment.  You develop a fever and have warmth, redness, and swelling over your knee. This information is not intended to replace advice given to you by your health care provider. Make sure you discuss any questions you have with your health care provider. Document Released: 09/03/2005 Document Revised: 05/08/2016 Document Reviewed: 06/03/2015 Elsevier Interactive Patient Education  [employer]2018 [employer]

## 2018-08-12 NOTE — Progress Notes (Signed)
Patient ID: Tyler Craig, male    DOB: 08/06/1971, 47 y.o.   MRN: [id number]  PCP: Salley Scarleturham, Kawanta F, MD  Chief Complaint  Patient presents with  . Knee Pain    Patient in with c/o right knee pain, and swelling. onset 1 month ago. No injury     Subjective:   Tyler Glimpsellen D Harner is a 47 y.o. male, presents to clinic with CC of right knee swelling and intermittent pain x 1 month, gradual onset, no trauma.  He works as a [employer], works kneeling often.  Pain occurs with kneeling, so he has been using padding on the concrete or trying not to kneel on right knee.  He's had no swelling or redness of skin.  Pain is mild, intermittent, occurs with and immediately after kneeling, improves with rest.  Swelling has not improved over the past month.  He takes [facility] powders for chronic back pain, no other treatments tried for his knee.        Patient Active Problem List   Diagnosis Date Noted  . 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 S/P cervical spinal fusion 04/04/2017  . Cervical spinal stenosis 02/04/2017  . DDD (degenerative disc disease), cervical 12/12/2016  . DDD (degenerative disc disease), lumbar 12/12/2016  . 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 Benign paroxysmal positional vertigo 08/03/2016  . Hyperlipidemia 11/16/2014  . Insomnia 07/05/2014  . Elbow pain 02/09/2014  . MDD (major depressive disorder) 12/29/2013  . GAD (generalized anxiety disorder) 12/29/2013  . Smoker 12/29/2013     Prior to Admission medications   Medication Sig Start Date End Date Taking? Authorizing Provider  atorvastatin (LIPITOR) 40 MG tablet Take 1 tablet (40 mg total) by mouth daily. 08/15/17  Yes [place], Velna HatchetKawanta F, MD  calcium carbonate (TUMS - DOSED IN MG ELEMENTAL CALCIUM) 500 MG chewable tablet Chew 3 tablets by mouth 2 (two) times daily as needed for indigestion or heartburn.   Yes [provider]  FLUoxetine (PROZAC) 40 MG capsule TAKE 1 CAPSULE BY MOUTH ONCE DAILY 08/11/18   Yes [place], Velna HatchetKawanta F, MD  HYDROcodone-acetaminophen (NORCO) 5-325 MG tablet Take 1 tablet by mouth every 6 (six) hours as needed for moderate pain. Dx: G89.4, G54.2 08/08/18  Yes [place], Velna HatchetKawanta F, MD  gabapentin (NEURONTIN) 300 MG capsule Take 1 capsule (300 mg total) by mouth at bedtime. Patient not taking: Reported on 08/12/2018 04/21/18   Salley Scarleturham, Kawanta F, MD     Allergies  Allergen Reactions  . No Known Allergies      Family History  Problem Relation Age of Onset  . Hypertension Mother   . Cancer Father [id number]       colon cancer  . Heart disease Maternal Uncle 48       massive MI  . Heart disease Maternal Grandfather      Social History   Socioeconomic History  . Marital status: Married    Spouse name: Not on file  . Number of children: Not on file  . Years of education: Not on file  . Highest education level: Not on file  Occupational History  . Not on file  Social Needs  . Financial resource strain: Not on file  . Food insecurity:    Worry: Not on file    Inability: Not on file  . Transportation needs:    Medical: Not on file    Non-medical: Not on file  Tobacco Use  . Smoking status: Current Every Day Smoker  Packs/day: 1.00    Years: 44.00    Pack years: 44.00    Types: Cigarettes  . Smokeless tobacco: Never Used  Substance and Sexual Activity  . Alcohol use: Yes    Comment: occasionally  . Drug use: Not on file  . Sexual activity: Yes  Lifestyle  . Physical activity:    Days per week: Not on file    Minutes per session: Not on file  . Stress: Not on file  Relationships  . Social connections:    Talks on phone: Not on file    Gets together: Not on file    Attends religious service: Not on file    Active member of club or organization: Not on file    Attends meetings of clubs or organizations: Not on file    Relationship status: Not on file  . Intimate partner violence:    Fear of current or ex partner: Not on file    Emotionally abused: Not  on file    Physically abused: Not on file    Forced sexual activity: Not on file  Other Topics Concern  . Not on file  Social History Narrative  . Not on file     Review of Systems  Constitutional: Negative.   HENT: Negative.   Eyes: Negative.   Respiratory: Negative.   Cardiovascular: Negative.   Gastrointestinal: Negative.   Endocrine: Negative.   Genitourinary: Negative.   Musculoskeletal: Negative.   Skin: Negative.   Allergic/Immunologic: Negative.   Neurological: Negative.   Hematological: Negative.   Psychiatric/Behavioral: Negative.   All other systems reviewed and are negative.      Objective:    Vitals:   08/12/18 1226  BP: 122/82  Pulse: 96  Resp: 16  Temp: 98.3 F (36.8 C)  TempSrc: Oral  SpO2: 94%  Weight: 188 lb 8 oz (85.5 kg)  Height: 5\' 10"  (1.778 m)      Physical Exam  Constitutional: He appears well-developed and well-nourished. No distress.  HENT:  Head: Normocephalic and atraumatic.  Nose: Nose normal.  Eyes: Conjunctivae are normal. Right eye exhibits no discharge. Left eye exhibits no discharge.  Neck: No tracheal deviation present.  Cardiovascular: Normal rate and regular rhythm.  Pulmonary/Chest: Effort normal. No stridor. No respiratory distress.  Musculoskeletal:       Right knee: He exhibits effusion. He exhibits normal range of motion, no swelling, no ecchymosis, no deformity, no laceration, no erythema, normal alignment, no LCL laxity, normal patellar mobility, no bony tenderness, normal meniscus and no MCL laxity. No tenderness found.       Left knee: Normal.       Lumbar back: He exhibits decreased range of motion.  Soft fluid effusion over right patella, no right knee joint effusion  Neurological: He is alert. He exhibits normal muscle tone. Coordination normal.  Skin: Skin is warm and dry. No rash noted. He is not diaphoretic.  Psychiatric: He has a normal mood and affect. His behavior is normal.  Nursing note and vitals  reviewed.     Joint Injection/Arthrocentesis Date/Time: 08/12/2018 1:10 PM Performed by: Danelle Berry, PA-C Authorized by: Danelle Berry, PA-C  Indications: joint swelling  Body area: knee Local anesthesia used: yes Anesthesia: local infiltration  Anesthesia: Local anesthesia used: yes Local Anesthetic: lidocaine 2% with epinephrine Anesthetic total: 0.5 mL  Sedation: Patient sedated: no  Preparation: Patient was prepped and draped in the usual sterile fashion. Needle size: 18 G Ultrasound guidance: no Approach: lateral Aspirate: bloody Aspirate  amount: 6 mL Patient tolerance: Patient tolerated the procedure well with no immediate complications Comments: Right knee prepatellar effusion, no erythema, no edema Risk, benefit and SE discussed with the pt prior to procedure, he consented. He understands effusion may reoccur Bandage and compression dressing applied immediately after aspiration Danelle Berry, PA-C 08/12/18 1:20 PM         Assessment & Plan:      ICD-10-CM   1. Prepatellar effusion of right knee M25.461     Suspect prepatellar effusion on right from repeated kneeling/microtrauma.  Weakly and on physical exam there are no concerning signs of infection. Discussed diagnostic and therapeutic aspiration of the effusion. Patient encouraged to treat with compression, ice, rest, and with kneeling for his employment he needs to use a pad on his proximal shin and avoid any pressure on his knee for the next several weeks  Is a history of some GERD and stomach upset with over-the-counter medicines and BC powders and in trying to prescribe NSAID there is interactions with his psychiatric medicine so we will avoid NSAIDs at this time and treat with a short burst of steroids.  Is to follow-up immediately or contact us with any signs of worsening inflammation or erythema.  If the effusion re-accumulates which he understands can happen, and I would refer him to  Ortho.     Danelle Berry, PA-C 08/12/18 12:41 PM

## 2018-08-13 LAB — SYNOVIAL CELL COUNT + DIFF, W/ CRYSTALS
Basophils, %: 1 % — ABNORMAL HIGH
Eosinophils-Synovial: 2 % (ref 0–2)
Lymphocytes-Synovial Fld: 23 % (ref 0–74)
MONOCYTE/MACROPHAGE: 65 % (ref 0–69)
Neutrophil, Synovial: 7 % (ref 0–24)
SYNOVIOCYTES, %: 2 % (ref 0–15)
WBC, SYNOVIAL: 1013 {cells}/uL — AB (ref <150)

## 2018-08-17 LAB — AEROBIC CULTURE
AER RESULT:: NO GROWTH
MICRO NUMBER:: 91424809
SPECIMEN QUALITY: ADEQUATE

## 2018-08-21 ENCOUNTER — Encounter: Payer: Self-pay | Admitting: Family Medicine

## 2018-08-21 ENCOUNTER — Ambulatory Visit (INDEPENDENT_AMBULATORY_CARE_PROVIDER_SITE_OTHER): Payer: BLUE CROSS/BLUE SHIELD | Admitting: Family Medicine

## 2018-08-21 VITALS — BP 130/80 | HR 84 | Temp 98.4°F | Resp 15 | Ht 70.0 in | Wt 189.5 lb

## 2018-08-21 DIAGNOSIS — M25461 Effusion, right knee: Secondary | ICD-10-CM | POA: Diagnosis not present

## 2018-08-21 NOTE — Progress Notes (Signed)
Patient ID: Tyler Craig, male    DOB: 1971/05/12, 47 y.o.   MRN: [id number]  PCP: Salley Scarleturham, Kawanta F, MD  Chief Complaint  Patient presents with  . Prepatellar effusion of right knee    Subjective:   Tyler Glimpsellen D Sauser is a 47 y.o. male, presents to clinic with CC of right knee prepatellar effusion.  He states he is here to follow-up on lab results.  Labs were reviewed with him over the phone, they were significant for elevated white blood cells but there was no bacterial growth.  He has had no redness to his knee, pain is very mild if any and usually no pain or discomfort at all.  He has had some slight reaccumulation of fluid over the right knee but it is still much less than before.  Continues to try and apply Ace wrap for compression, he has been avoiding kneeling on it.   Patient Active Problem List   Diagnosis Date Noted  . 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 S/P cervical spinal fusion 04/04/2017  . Cervical spinal stenosis 02/04/2017  . DDD (degenerative disc disease), cervical 12/12/2016  . DDD (degenerative disc disease), lumbar 12/12/2016  . 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 Benign paroxysmal positional vertigo 08/03/2016  . Hyperlipidemia 11/16/2014  . Insomnia 07/05/2014  . Elbow pain 02/09/2014  . MDD (major depressive disorder) 12/29/2013  . GAD (generalized anxiety disorder) 12/29/2013  . Smoker 12/29/2013     Prior to Admission medications   Medication Sig Start Date End Date Taking? Authorizing Provider  atorvastatin (LIPITOR) 40 MG tablet Take 1 tablet (40 mg total) by mouth daily. 08/15/17  Yes Eskridge, Velna HatchetKawanta F, MD  calcium carbonate (TUMS - DOSED IN MG ELEMENTAL CALCIUM) 500 MG chewable tablet Chew 3 tablets by mouth 2 (two) times daily as needed for indigestion or heartburn.   Yes [provider]  FLUoxetine (PROZAC) 40 MG capsule TAKE 1 CAPSULE BY MOUTH ONCE DAILY 08/11/18  Yes Telford, Velna HatchetKawanta F, MD    HYDROcodone-acetaminophen (NORCO) 5-325 MG tablet Take 1 tablet by mouth every 6 (six) hours as needed for moderate pain. Dx: G89.4, G54.2 08/08/18  Yes Concord, Velna HatchetKawanta F, MD     Allergies  Allergen Reactions  . No Known Allergies      Family History  Problem Relation Age of Onset  . Hypertension Mother   . Cancer Father 6565       colon cancer  . Heart disease Maternal Uncle 48       massive MI  . Heart disease Maternal Grandfather      Social History   Socioeconomic History  . Marital status: Married    Spouse name: Not on file  . Number of children: Not on file  . Years of education: Not on file  . Highest education level: Not on file  Occupational History  . Not on file  Social Needs  . Financial resource strain: Not on file  . Food insecurity:    Worry: Not on file    Inability: Not on file  . Transportation needs:    Medical: Not on file    Non-medical: Not on file  Tobacco Use  . Smoking status: Current Every Day Smoker    Packs/day: 1.00    Years: 44.00    Pack years: 44.00    Types: Cigarettes  . Smokeless tobacco: Never Used  Substance and Sexual Activity  . Alcohol use: Yes    Comment: occasionally  .  Drug use: Not on file  . Sexual activity: Yes  Lifestyle  . Physical activity:    Days per week: Not on file    Minutes per session: Not on file  . Stress: Not on file  Relationships  . Social connections:    Talks on phone: Not on file    Gets together: Not on file    Attends religious service: Not on file    Active member of club or organization: Not on file    Attends meetings of clubs or organizations: Not on file    Relationship status: Not on file  . Intimate partner violence:    Fear of current or ex partner: Not on file    Emotionally abused: Not on file    Physically abused: Not on file    Forced sexual activity: Not on file  Other Topics Concern  . Not on file  Social History Narrative  . Not on file     Review of Systems   Constitutional: Negative.   HENT: Negative.   Eyes: Negative.   Respiratory: Negative.   Cardiovascular: Negative.   Gastrointestinal: Negative.   Endocrine: Negative.   Genitourinary: Negative.   Musculoskeletal: Negative.   Skin: Negative.   Allergic/Immunologic: Negative.   Neurological: Negative.   Hematological: Negative.   Psychiatric/Behavioral: Negative.   All other systems reviewed and are negative.      Objective:    Vitals:   08/21/18 1555  BP: 130/80  Pulse: 84  Resp: 15  Temp: 98.4 F (36.9 C)  TempSrc: Oral  SpO2: 98%  Weight: 189 lb 8 oz (86 kg)  Height: 5\' 10"  (1.778 m)      Physical Exam  Constitutional: He appears well-developed and well-nourished. No distress.  HENT:  Head: Normocephalic and atraumatic.  Eyes: Pupils are equal, round, and reactive to light. Conjunctivae are normal.  Neck: No tracheal deviation present.  Pulmonary/Chest: Effort normal and breath sounds normal. No respiratory distress.  Musculoskeletal:       Right knee: He exhibits normal range of motion, no swelling, no ecchymosis, no deformity, no erythema and no bony tenderness. No tenderness found.  Right knee very small amount of prepatellar effusion noted not apparently visible with simple inspection of the knee No tenderness to palpation  Neurological: He is alert. He exhibits normal muscle tone. Coordination normal.  Normal gait  Skin: Skin is warm and dry. Capillary refill takes less than 2 seconds. He is not diaphoretic. No erythema. No pallor.  Psychiatric: He has a normal mood and affect. His behavior is normal.  Nursing note and vitals reviewed.         Assessment & Plan:      ICD-10-CM   1. Prepatellar effusion of right knee M25.461 Ambulatory referral to Orthopedic Surgery    Patient with a right prepatellar effusion, about a week ago fluid was aspirated with aseptic technique, fluid sent off for testing, compression bandage was applied and patient was  encouraged to rest, ice elevate and keep compression on it. Labs were previously reviewed, no indication for antibiotics.  There has been no redness or warmth develop since procedure.  He returns today stating he was to follow-up on the lab work and he has noticed a Sperry small amount of fluid reaccumulate.  Advised him to continue compression, Ice, NSAIDs and avoid any microtrauma or kneeling.  Refer him to Ortho for further evaluation and treatment, do believe it is out of my scope and experienced to further  aspirate or inject the prepatellar bursa.  Danelle Berry, PA-C 08/21/18 4:02 PM

## 2018-09-08 ENCOUNTER — Other Ambulatory Visit: Payer: Self-pay | Admitting: *Deleted

## 2018-09-08 MED ORDER — HYDROCODONE-ACETAMINOPHEN 5-325 MG PO TABS: 1.0000 | ORAL_TABLET | Freq: Four times a day (QID) | ORAL | 0 refills | Status: DC | PRN

## 2018-09-08 NOTE — Telephone Encounter (Signed)
Received call from patient.   Requested refill on Hydrocodone/APAP.   Ok to refill??  Last office visit 08/21/2018.  Last refill 08/08/2018.

## 2018-09-24 ENCOUNTER — Ambulatory Visit (INDEPENDENT_AMBULATORY_CARE_PROVIDER_SITE_OTHER): Payer: BLUE CROSS/BLUE SHIELD | Admitting: Orthopedic Surgery

## 2018-10-08 ENCOUNTER — Ambulatory Visit (INDEPENDENT_AMBULATORY_CARE_PROVIDER_SITE_OTHER): Payer: BLUE CROSS/BLUE SHIELD

## 2018-10-08 ENCOUNTER — Ambulatory Visit (INDEPENDENT_AMBULATORY_CARE_PROVIDER_SITE_OTHER): Payer: BLUE CROSS/BLUE SHIELD | Admitting: Orthopedic Surgery

## 2018-10-08 ENCOUNTER — Encounter (INDEPENDENT_AMBULATORY_CARE_PROVIDER_SITE_OTHER): Payer: Self-pay | Admitting: Orthopedic Surgery

## 2018-10-08 ENCOUNTER — Ambulatory Visit (INDEPENDENT_AMBULATORY_CARE_PROVIDER_SITE_OTHER): Payer: Self-pay

## 2018-10-08 VITALS — Ht 70.0 in | Wt 189.5 lb

## 2018-10-08 DIAGNOSIS — M7041 Prepatellar bursitis, right knee: Secondary | ICD-10-CM | POA: Diagnosis not present

## 2018-10-08 DIAGNOSIS — M25561 Pain in right knee: Secondary | ICD-10-CM

## 2018-10-10 ENCOUNTER — Other Ambulatory Visit: Payer: Self-pay | Admitting: *Deleted

## 2018-10-10 MED ORDER — HYDROCODONE-ACETAMINOPHEN 5-325 MG PO TABS: 1.0000 | ORAL_TABLET | Freq: Four times a day (QID) | ORAL | 0 refills | Status: DC | PRN

## 2018-10-10 NOTE — Telephone Encounter (Signed)
Received call from patient.   Requested refill on Hydrocodone/APAP.   Ok to refill??  Last office visit 08/21/2018.   Last refill 09/08/2018.

## 2018-10-13 ENCOUNTER — Encounter (INDEPENDENT_AMBULATORY_CARE_PROVIDER_SITE_OTHER): Payer: Self-pay | Admitting: Orthopedic Surgery

## 2018-10-13 ENCOUNTER — Telehealth: Payer: Self-pay | Admitting: *Deleted

## 2018-10-13 NOTE — Telephone Encounter (Signed)
Your information has been submitted to [employer]. [employer] is reviewing the PA request and you will receive an electronic response. You may check for the updated outcome later by reopening this request. The standard fax determination will also be sent to you directly.  If you have any questions about your PA submission, contact [employer] at [phone]. 

## 2018-10-13 NOTE — Telephone Encounter (Signed)
Received request from pharmacy for PA on Hydrocodone/APAP.  PA submitted.   Dx: G89.4- chronic pain.  

## 2018-10-13 NOTE — Progress Notes (Signed)
Office Visit Note   Patient: Tyler Craig           Date of Birth: [birth year]           MRN: [id number] Visit Date: 10/08/2018 Requested by: Danelle Berryapia, Leisa, PA-C [address] [place], [place] [id number] PCP: Salley Scarleturham, Kawanta F, MD  Subjective: Chief Complaint  Patient presents with  . Right Knee - Pain    HPI: Tyler Craig is a patient with right knee swelling.  He works as a Curatormechanic.  Had prepatellar bursa aspirated 08/12/2018.  Fluid sent for laboratory analysis and it did have several 100 white blood cells.  Small amount of swelling has returned.  He denies any fevers or chills.  He does work as a Curatormechanic and does spend a lot of time on his knees.              ROS: All systems reviewed are negative as they relate to the chief complaint within the history of present illness.  Patient denies  fevers or chills.   Assessment & Plan: Visit Diagnoses:  1. Right knee pain, unspecified chronicity   2. Prepatellar bursitis of right knee     Plan: Impression is resolving non-septic prepatellar bursitis in the right knee.  There is no other knee effusion.  I think this should be a self-limited problem.  He is at risk for developing septic prepatellar bursitis.  Those symptoms and signs are reviewed with the patient.  For now I think the aspiration helped to resolve some of the mass-effect that he feels when he is on his knee.  I do not think there is enough fluid to re-aspirate today.  No indication for any surgical intervention at this time.  Follow-up with me as needed  Follow-Up Instructions: Return if symptoms worsen or fail to improve.   Orders:  Orders Placed This Encounter  Procedures  . XR Knee 1-2 Views Right  . XR KNEE 3 VIEW RIGHT   No orders of the defined types were placed in this encounter.     Procedures: No procedures performed   Clinical Data: No additional findings.  Objective: Vital Signs: Ht 5\' 10"  (1.778 m)   Wt 189 lb 8 oz (86 kg)   BMI 27.19 kg/m    Physical Exam:   Constitutional: Patient appears well-developed HEENT:  Head: Normocephalic Eyes:EOM are normal Neck: Normal range of motion Cardiovascular: Normal rate Pulmonary/chest: Effort normal Neurologic: Patient is alert Skin: Skin is warm Psychiatric: Patient has normal mood and affect    Ortho Exam: Ortho exam demonstrates normal gait alignment.  Slight amount of bogginess and synovitis in the prepatellar bursa on the right but no warmth to the right knee.  No right knee effusion.  No erythema or fluctuance.  No real discrete tenderness in the prepatellar bursa.  He does have evidence around the tibial tubercle of spending time on his knees.  This puts him at some risk this.  Specialty Comments:  No specialty comments available.  Imaging: Xr Knee 1-2 Views Right  Result Date: 10/13/2018 AP lateral right knee reviewed.  No fracture dislocation is present.  No arthritis.  No bony abnormalities.  Normal right knee    PMFS History: Patient Active Problem List   Diagnosis Date Noted  . 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 S/P cervical spinal fusion 04/04/2017  . Cervical spinal stenosis 02/04/2017  . DDD (degenerative disc disease), cervical 12/12/2016  . DDD (degenerative disc disease), lumbar 12/12/2016  .  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 Benign paroxysmal positional vertigo 08/03/2016  . Hyperlipidemia 11/16/2014  . Insomnia 07/05/2014  . Elbow pain 02/09/2014  . MDD (major depressive disorder) 12/29/2013  . GAD (generalized anxiety disorder) 12/29/2013  . Smoker 12/29/2013   Past Medical History:  Diagnosis Date  . Anxiety    takes Fluoxetine daily  . Hyperlipidemia    takes Atorvastatin daily  . Muscle spasm    takes Flexeril as needed  . Numbness    left arm and left leg.Tingling as well  . PONV (postoperative nausea and vomiting)     Family History  Problem Relation Age of Onset  . Hypertension Mother   . Cancer Father 7565        colon cancer  . Heart disease Maternal Uncle 48       massive MI  . Heart disease Maternal Grandfather     Past Surgical History:  Procedure Laterality Date  . ANTERIOR CERVICAL DECOMP/DISCECTOMY FUSION N/A 02/04/2017   Procedure: C4-5, C5-6 Anterior Cervical Discectomy and Fusion, Allograft and Plate at B2-8C4-5 and Zero P at C5-6;  Surgeon: Eldred MangesYates, Mark C, MD;  [facility];  Service: Orthopedics;  Laterality: N/A;  . EYE SURGERY     left eye artificial lens placed  . HERNIA REPAIR    . SPINE SURGERY     neck surgery- has plate   Social History   Occupational History  . Not on file  Tobacco Use  . Smoking status: Current Every Day Smoker    Packs/day: 1.00    Years: 44.00    Pack years: 44.00    Types: Cigarettes  . Smokeless tobacco: Never Used  Substance and Sexual Activity  . Alcohol use: Yes    Comment: occasionally  . Drug use: Not on file  . Sexual activity: Yes

## 2018-10-14 NOTE — Telephone Encounter (Signed)
Received PA determination.   PA has been cancelled as [employer] does not process PA.   Of note, patient has picked up 7 day supply and will require refill much sooner.

## 2018-10-14 NOTE — Telephone Encounter (Signed)
Received determination.   Advised that [employer] does not handle PA for patient.

## 2018-10-29 ENCOUNTER — Telehealth: Payer: Self-pay | Admitting: Family Medicine

## 2018-10-29 NOTE — Telephone Encounter (Signed)
Per christina last message about PA, They still will not fill his hydrocodone because PA is needed.

## 2018-11-03 MED ORDER — HYDROCODONE-ACETAMINOPHEN 5-325 MG PO TABS: 1.0000 | ORAL_TABLET | Freq: Four times a day (QID) | ORAL | 0 refills | Status: DC | PRN

## 2018-11-03 NOTE — Telephone Encounter (Signed)
PA submitted for Hydrocodone.   Received immediate determination.   PA approved 11/03/2018 through 12/02/2018.  Ok to refill??  Last office visit 08/21/2018.  Last refill 10/10/2018.

## 2018-11-03 NOTE — Telephone Encounter (Signed)
Medication refilled

## 2018-11-24 ENCOUNTER — Other Ambulatory Visit: Payer: Self-pay | Admitting: Family Medicine

## 2018-11-26 ENCOUNTER — Ambulatory Visit (INDEPENDENT_AMBULATORY_CARE_PROVIDER_SITE_OTHER): Payer: BLUE CROSS/BLUE SHIELD

## 2018-11-26 ENCOUNTER — Ambulatory Visit (INDEPENDENT_AMBULATORY_CARE_PROVIDER_SITE_OTHER): Payer: BLUE CROSS/BLUE SHIELD | Admitting: Orthopaedic Surgery

## 2018-11-26 ENCOUNTER — Encounter (INDEPENDENT_AMBULATORY_CARE_PROVIDER_SITE_OTHER): Payer: Self-pay | Admitting: Orthopaedic Surgery

## 2018-11-26 ENCOUNTER — Other Ambulatory Visit: Payer: Self-pay

## 2018-11-26 DIAGNOSIS — M7541 Impingement syndrome of right shoulder: Secondary | ICD-10-CM

## 2018-11-26 DIAGNOSIS — G8929 Other chronic pain: Secondary | ICD-10-CM

## 2018-11-26 DIAGNOSIS — M79601 Pain in right arm: Secondary | ICD-10-CM

## 2018-11-26 DIAGNOSIS — M542 Cervicalgia: Secondary | ICD-10-CM

## 2018-11-26 DIAGNOSIS — M25511 Pain in right shoulder: Secondary | ICD-10-CM

## 2018-11-26 MED ORDER — LIDOCAINE HCL 1 % IJ SOLN
3.0000 mL | INTRAMUSCULAR | Status: AC | PRN
  Administered 2018-11-26: 3 mL

## 2018-11-26 MED ORDER — METHYLPREDNISOLONE ACETATE 40 MG/ML IJ SUSP
40.0000 mg | INTRAMUSCULAR | Status: AC | PRN
  Administered 2018-11-26: 40 mg via INTRA_ARTICULAR

## 2018-11-26 NOTE — Progress Notes (Signed)
Office Visit Note   Patient: Tyler Craig           Date of Birth: 13-Jun-1971           MRN: [id number] Visit Date: 11/26/2018              Requested by: Salley Scarlet, MD [address] [place], [place] [address] PCP: Salley Scarlet, MD   Assessment & Plan: Visit Diagnoses:  1. Right arm pain   2. Neck pain   3. Chronic right shoulder pain   4. Impingement syndrome of right shoulder     Plan: Based on his clinical exam I do feel that he has severe impingement syndrome in the right shoulder from his repetitive activities.  Offered him a steroid injection in the subacromial space and explained the rationale behind this as well as the risk and benefits involved.  He tolerated well.  We will see him back in about 3 weeks to see how he is doing overall.  This is a shoulder I would certainly consider an MRI on based on his symptoms because he certainly could end up needing a subacromial decompression with a manipulation followed by physical therapy.  He did get some relief from the injection in the office.  All question concerns were answered and addressed.  We will see him back in 3 weeks.  Follow-Up Instructions: Return in about 3 weeks (around 12/17/2018).   Orders:  Orders Placed This Encounter  Procedures  . Large Joint Inj  . XR Shoulder Right  . XR Cervical Spine 2 or 3 views   No orders of the defined types were placed in this encounter.     Procedures: Large Joint Inj: R subacromial bursa on 11/26/2018 4:48 PM Indications: pain and diagnostic evaluation Details: 22 G 1.5 in needle  Arthrogram: No  Medications: 3 mL lidocaine 1 %; 40 mg methylPREDNISolone acetate 40 MG/ML Outcome: tolerated well, no immediate complications Procedure, treatment alternatives, risks and benefits explained, specific risks discussed. Consent was given by the patient. Immediately prior to procedure a time out was called to verify the correct patient, procedure, equipment, support  staff and site/side marked as required. Patient was prepped and draped in the usual sterile fashion.       Clinical Data: No additional findings.   Subjective: Chief Complaint  Patient presents with  . Neck - Pain  . Right Shoulder - Pain  Patient is a very pleasant 48 year old gentleman who I am seeing for the first time but he is an established patient of our practice.  22 months ago he did have a C4-C5 and C5-C6 anterior cervical discectomy and fusion with allograft and plates by Dr. Ophelia Charter.  He has done well from that standpoint but is been having a lot of shoulder pain with activities.  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  He is a lot of stiffness in his shoulder he still has numbness and tingling in his hand he is left-hand dominant but his right shoulder is bothersome the most.  It does wake him up at night and he has decreased motion of that shoulder.  He is not a diabetic.  He has never had an injection in his shoulder. HPI  Review of Systems He currently denies any headache, chest pain, shortness of breath, fever, chills, nausea, vomiting  Objective: Vital Signs: There were no vitals taken for  this visit.  Physical Exam He is alert and orient x3 and in no acute distress Ortho Exam Examination of both shoulders are obtained today.  His abduction is full in both shoulders.  His external rotation is full in both shoulders.  His left shoulder has limited internal rotation with adduction his right shoulder is internal Tatian with adduction is only to the lower lumbar spine.  He does have positive Neer and Hawkins signs on the left side.  His rotator cuff itself feels strong and he has a negative liftoff. Specialty Comments:  No specialty comments available.  Imaging: Xr Cervical Spine 2 Or 3 Views  Result Date: 11/26/2018 2 views of the cervical spine show intact hardware from anterior plating at 2  different levels.  There is fusion at those levels appears intact.  The alignment is overall well-maintained with no complicating features of the hardware.  There is otherwise no acute findings.  Xr Shoulder Right  Result Date: 11/26/2018 3 views of the right shoulder show no acute findings.  The shoulder is well located.  The [facility] joint has adequate space.  There is decrease in the subacromial outlet on the outlet view.    PMFS History: Patient Active Problem List   Diagnosis Date Noted  . 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 S/P cervical spinal fusion 04/04/2017  . Cervical spinal stenosis 02/04/2017  . DDD (degenerative disc disease), cervical 12/12/2016  . DDD (degenerative disc disease), lumbar 12/12/2016  . 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 Benign paroxysmal positional vertigo 08/03/2016  . Hyperlipidemia 11/16/2014  . Insomnia 07/05/2014  . Elbow pain 02/09/2014  . MDD (major depressive disorder) 12/29/2013  . GAD (generalized anxiety disorder) 12/29/2013  . Smoker 12/29/2013   Past Medical History:  Diagnosis Date  . Anxiety    takes Fluoxetine daily  . Hyperlipidemia    takes Atorvastatin daily  . Muscle spasm    takes Flexeril as needed  . Numbness    left arm and left leg.Tingling as well  . PONV (postoperative nausea and vomiting)     Family History  Problem Relation Age of Onset  . Hypertension Mother   . Cancer Father 26       colon cancer  . Heart disease Maternal Uncle 48       massive MI  . Heart disease Maternal Grandfather     Past Surgical History:  Procedure Laterality Date  . ANTERIOR CERVICAL DECOMP/DISCECTOMY FUSION N/A 02/04/2017   Procedure: C4-5, C5-6 Anterior Cervical Discectomy and Fusion, Allograft and Plate at C5-8 and Zero P at C5-6;  Surgeon: Eldred Manges, MD;  Location: [facility] OR;  Service: Orthopedics;  Laterality: N/A;  . EYE SURGERY     left eye artificial lens placed  . HERNIA REPAIR    . SPINE SURGERY     neck  surgery- has plate   Social History   Occupational History  . Not on file  Tobacco Use  . Smoking status: Current Every Day Smoker    Packs/day: 1.00    Years: 44.00    Pack years: 44.00    Types: Cigarettes  . Smokeless tobacco: Never Used  Substance and Sexual Activity  . Alcohol use: Yes    Comment: occasionally  . Drug use: Not on file  . Sexual activity: Yes

## 2018-12-01 ENCOUNTER — Other Ambulatory Visit: Payer: Self-pay | Admitting: *Deleted

## 2018-12-01 MED ORDER — HYDROCODONE-ACETAMINOPHEN 5-325 MG PO TABS: 1.0000 | ORAL_TABLET | Freq: Four times a day (QID) | ORAL | 0 refills | Status: DC | PRN

## 2018-12-01 NOTE — Telephone Encounter (Signed)
Received call from patient.   Requested refill on Hydrocodone/ APAP.   Ok to refill??  Last office visit 08/21/2018.  Last refill 11/03/2018.

## 2018-12-16 ENCOUNTER — Telehealth (INDEPENDENT_AMBULATORY_CARE_PROVIDER_SITE_OTHER): Payer: Self-pay | Admitting: Radiology

## 2018-12-16 NOTE — Telephone Encounter (Signed)
Answered no to screening questions except cough.  Patient has had cough for 1 week with occasional phlegm, does have allergies and has been outside. No shortness of breath.  Informed patient he will be called back if appointment needed to be changed.

## 2018-12-16 NOTE — Telephone Encounter (Signed)
FYI

## 2018-12-17 ENCOUNTER — Ambulatory Visit (INDEPENDENT_AMBULATORY_CARE_PROVIDER_SITE_OTHER): Payer: BLUE CROSS/BLUE SHIELD | Admitting: Orthopaedic Surgery

## 2018-12-31 ENCOUNTER — Other Ambulatory Visit: Payer: Self-pay | Admitting: *Deleted

## 2018-12-31 MED ORDER — HYDROCODONE-ACETAMINOPHEN 5-325 MG PO TABS: 1.0000 | ORAL_TABLET | Freq: Four times a day (QID) | ORAL | 0 refills | Status: DC | PRN

## 2018-12-31 NOTE — Telephone Encounter (Signed)
Received call from patient.   Requested refill on Hydrocodone/APAP.   Ok to refill??  Last office visit 08/21/2018.  Last refill 12/01/2018.

## 2019-01-21 ENCOUNTER — Ambulatory Visit: Payer: Self-pay | Admitting: Orthopaedic Surgery

## 2019-01-21 ENCOUNTER — Ambulatory Visit (INDEPENDENT_AMBULATORY_CARE_PROVIDER_SITE_OTHER): Payer: BLUE CROSS/BLUE SHIELD | Admitting: Orthopaedic Surgery

## 2019-01-29 ENCOUNTER — Other Ambulatory Visit: Payer: Self-pay | Admitting: *Deleted

## 2019-01-29 NOTE — Telephone Encounter (Signed)
Received call from patient.   Requested refill on Hydrocodone/APAP.   Ok to refill?? Last office visit 08/21/2018. Last refill 12/31/2018.  Of note, PA required for Opoid.  PA submitted and approved effective from 01/29/2019 through 02/27/2019.

## 2019-01-30 MED ORDER — HYDROCODONE-ACETAMINOPHEN 5-325 MG PO TABS: 1.0000 | ORAL_TABLET | Freq: Four times a day (QID) | ORAL | 0 refills | Status: DC | PRN

## 2019-03-02 ENCOUNTER — Telehealth: Payer: Self-pay | Admitting: *Deleted

## 2019-03-02 ENCOUNTER — Other Ambulatory Visit: Payer: Self-pay | Admitting: *Deleted

## 2019-03-02 MED ORDER — HYDROCODONE-ACETAMINOPHEN 5-325 MG PO TABS: 1.0000 | ORAL_TABLET | Freq: Four times a day (QID) | ORAL | 0 refills | Status: DC | PRN

## 2019-03-02 NOTE — Telephone Encounter (Signed)
Received request from pharmacy for PA on Hydrocodone/APAP.   PA submitted.   Patient has tried and failed: Flexeril, Robaxin, Prednisone, APAP, Oxycodone.   Dx: G89.4- Chronic Pain.  Received PA determination. PA approved from 03/02/2019 through 03/31/2019.

## 2019-03-02 NOTE — Telephone Encounter (Addendum)
Received call from patient.   Requested refill on Hydrocodone/ APAP.   Ok to refill??  Last office visit 08/21/2018.  Last refill 01/30/2019.  Of note, medication requires PA monthly. PA approved from 03/02/2019 through 03/31/2019.

## 2019-03-24 ENCOUNTER — Other Ambulatory Visit: Payer: Self-pay | Admitting: Family Medicine

## 2019-04-01 ENCOUNTER — Other Ambulatory Visit: Payer: Self-pay | Admitting: *Deleted

## 2019-04-01 MED ORDER — HYDROCODONE-ACETAMINOPHEN 5-325 MG PO TABS: 1.0000 | ORAL_TABLET | Freq: Four times a day (QID) | ORAL | 0 refills | Status: DC | PRN

## 2019-04-01 NOTE — Telephone Encounter (Signed)
Received call from patient.   Requested refill on Hydrocodone/APAP.  Ok to refill??  Last office visit 08/21/2018.  Last refill 03/02/2019.

## 2019-05-04 ENCOUNTER — Telehealth: Payer: Self-pay | Admitting: *Deleted

## 2019-05-04 ENCOUNTER — Other Ambulatory Visit: Payer: Self-pay | Admitting: *Deleted

## 2019-05-04 NOTE — Telephone Encounter (Signed)
Received request from pharmacy for PA on Hydrocodone/APAP.   PA submitted.   Patient has tried and failed: Flexeril, Robaxin, Prednisone, APAP, Oxycodone.   Dx: G89.4- Chronic Pain.  Received PA determination. PA approved from 05/04/2019 through 06/02/2019.

## 2019-05-04 NOTE — Telephone Encounter (Signed)
Received call from patient.   Requested refill on Hydrocodone/APAP.   Ok to refill??  Last office visit 08/21/2018.   Last refill 04/01/2019.

## 2019-05-05 MED ORDER — HYDROCODONE-ACETAMINOPHEN 5-325 MG PO TABS: 1.0000 | ORAL_TABLET | Freq: Four times a day (QID) | ORAL | 0 refills | Status: DC | PRN

## 2019-06-05 ENCOUNTER — Other Ambulatory Visit: Payer: Self-pay | Admitting: *Deleted

## 2019-06-05 MED ORDER — HYDROCODONE-ACETAMINOPHEN 5-325 MG PO TABS: 1.0000 | ORAL_TABLET | Freq: Four times a day (QID) | ORAL | 0 refills | Status: DC | PRN

## 2019-06-05 NOTE — Telephone Encounter (Signed)
Received call from patient.   Requested refill on Hydrocodone/APAP.  Ok to refill?? Last office visit 08/21/2018. Last refill 05/05/2019.  Of note, PA required monthly for medication. PA approved 06/05/2019 through 07/04/2019.

## 2019-06-19 ENCOUNTER — Other Ambulatory Visit: Payer: Self-pay | Admitting: Family Medicine

## 2019-07-06 ENCOUNTER — Other Ambulatory Visit: Payer: Self-pay

## 2019-07-06 MED ORDER — HYDROCODONE-ACETAMINOPHEN 5-325 MG PO TABS: 1.0000 | ORAL_TABLET | Freq: Four times a day (QID) | ORAL | 0 refills | Status: DC | PRN

## 2019-07-06 NOTE — Telephone Encounter (Signed)
Pt needs OV , in order to continue having pain meds refilled

## 2019-07-06 NOTE — Telephone Encounter (Signed)
Requested Prescriptions   Pending Prescriptions Disp Refills  . HYDROcodone-acetaminophen (NORCO) 5-325 MG tablet 45 tablet 0    Sig: Take 1 tablet by mouth every 6 (six) hours as needed for moderate pain. Dx: G89.4, G54.2    Last OV 08/21/2018  Last written 06/05/2019

## 2019-07-06 NOTE — Telephone Encounter (Signed)
appt sched

## 2019-07-13 ENCOUNTER — Other Ambulatory Visit: Payer: Self-pay

## 2019-07-13 ENCOUNTER — Ambulatory Visit: Payer: BC Managed Care – PPO | Admitting: Family Medicine

## 2019-07-13 ENCOUNTER — Encounter: Payer: Self-pay | Admitting: Family Medicine

## 2019-07-13 VITALS — BP 130/66 | HR 72 | Temp 98.4°F | Resp 12 | Ht 70.0 in | Wt 187.0 lb

## 2019-07-13 DIAGNOSIS — M5030 Other cervical disc degeneration, unspecified cervical region: Secondary | ICD-10-CM

## 2019-07-13 DIAGNOSIS — M5136 Other intervertebral disc degeneration, lumbar region: Secondary | ICD-10-CM | POA: Diagnosis not present

## 2019-07-13 DIAGNOSIS — Z23 Encounter for immunization: Secondary | ICD-10-CM | POA: Diagnosis not present

## 2019-07-13 DIAGNOSIS — E782 Mixed hyperlipidemia: Secondary | ICD-10-CM

## 2019-07-13 DIAGNOSIS — F339 Major depressive disorder, recurrent, unspecified: Secondary | ICD-10-CM

## 2019-07-13 DIAGNOSIS — F411 Generalized anxiety disorder: Secondary | ICD-10-CM

## 2019-07-13 MED ORDER — HYDROXYZINE HCL 25 MG PO TABS: 25.0000 mg | ORAL_TABLET | Freq: Two times a day (BID) | ORAL | 1 refills | Status: DC | PRN

## 2019-07-13 NOTE — Assessment & Plan Note (Signed)
Continue statin drug we will check his liver function and lipid panel.

## 2019-07-13 NOTE — Assessment & Plan Note (Signed)
Chronic pain status post surgery other new injuries.  He is maintained on hydrocodone.
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
## 2019-07-13 NOTE — Assessment & Plan Note (Signed)
Major depression with anxiety and now recent traumatic event.  We will continue on Prozac 40 mg he prefers not to increase this.  We will add hydroxyzine to see if this helps with anxiety and sleep especially at bedtime.  Prefer not to add any other type of controlled substances he also takes the hydrocodone regularly.  [facility].

## 2019-07-13 NOTE — Patient Instructions (Signed)
F/U 6 months For physical Try the hydroxyzine for sleep

## 2019-07-14 LAB — CBC WITH DIFFERENTIAL/PLATELET
Absolute Monocytes: 760 cells/uL (ref 200–950)
Basophils Absolute: 64 cells/uL (ref 0–200)
Basophils Relative: 0.6 %
Eosinophils Absolute: 139 cells/uL (ref 15–500)
Eosinophils Relative: 1.3 %
HCT: 41.3 % (ref 38.5–50.0)
Hemoglobin: 14.3 g/dL (ref 13.2–17.1)
Lymphs Abs: 2365 cells/uL (ref 850–3900)
MCH: 33.3 pg — ABNORMAL HIGH (ref 27.0–33.0)
MCHC: 34.6 g/dL (ref 32.0–36.0)
MCV: 96 fL (ref 80.0–100.0)
MPV: 11.1 fL (ref 7.5–12.5)
Monocytes Relative: 7.1 %
Neutro Abs: 7372 cells/uL (ref 1500–7800)
Neutrophils Relative %: 68.9 %
Platelets: 282 10*3/uL (ref 140–400)
RBC: 4.3 10*6/uL (ref 4.20–5.80)
RDW: 14.2 % (ref 11.0–15.0)
Total Lymphocyte: 22.1 %
WBC: 10.7 10*3/uL (ref 3.8–10.8)

## 2019-07-14 LAB — LIPID PANEL
Cholesterol: 212 mg/dL — ABNORMAL HIGH (ref <200)
HDL: 41 mg/dL (ref >=40)
LDL Cholesterol (Calc): 142 mg/dL (calc) — ABNORMAL HIGH
Non-HDL Cholesterol (Calc): 171 mg/dL (calc) — ABNORMAL HIGH (ref <130)
Total CHOL/HDL Ratio: 5.2 (calc) — ABNORMAL HIGH (ref <5.0)
Triglycerides: 155 mg/dL — ABNORMAL HIGH (ref <150)

## 2019-07-14 LAB — COMPREHENSIVE METABOLIC PANEL
AG Ratio: 2.1 (calc) (ref 1.0–2.5)
ALT: 24 U/L (ref 9–46)
AST: 18 U/L (ref 10–40)
Albumin: 4.4 g/dL (ref 3.6–5.1)
Alkaline phosphatase (APISO): 61 U/L (ref 36–130)
BUN: 11 mg/dL (ref 7–25)
CO2: 26 mmol/L (ref 20–32)
Calcium: 9.9 mg/dL (ref 8.6–10.3)
Chloride: 104 mmol/L (ref 98–110)
Creat: 1.1 mg/dL (ref 0.60–1.35)
Globulin: 2.1 g/dL (calc) (ref 1.9–3.7)
Glucose, Bld: 80 mg/dL (ref 65–99)
Potassium: 4.8 mmol/L (ref 3.5–5.3)
Sodium: 140 mmol/L (ref 135–146)
Total Bilirubin: 0.3 mg/dL (ref 0.2–1.2)
Total Protein: 6.5 g/dL (ref 6.1–8.1)

## 2019-08-03 ENCOUNTER — Ambulatory Visit: Payer: BC Managed Care – PPO | Admitting: Family Medicine

## 2019-08-03 ENCOUNTER — Encounter: Payer: Self-pay | Admitting: Family Medicine

## 2019-08-03 ENCOUNTER — Other Ambulatory Visit: Payer: Self-pay

## 2019-08-03 VITALS — BP 132/68 | HR 82 | Temp 98.7°F | Resp 14 | Ht 70.0 in | Wt 184.0 lb

## 2019-08-03 DIAGNOSIS — Z13220 Encounter for screening for lipoid disorders: Secondary | ICD-10-CM | POA: Diagnosis not present

## 2019-08-03 DIAGNOSIS — R112 Nausea with vomiting, unspecified: Secondary | ICD-10-CM | POA: Diagnosis not present

## 2019-08-03 DIAGNOSIS — R1011 Right upper quadrant pain: Secondary | ICD-10-CM | POA: Diagnosis not present

## 2019-08-03 DIAGNOSIS — K219 Gastro-esophageal reflux disease without esophagitis: Secondary | ICD-10-CM

## 2019-08-03 MED ORDER — OMEPRAZOLE 40 MG PO CPDR: 40.0000 mg | DELAYED_RELEASE_CAPSULE | Freq: Every day | ORAL | 3 refills | Status: DC

## 2019-08-03 MED ORDER — HYDROCODONE-ACETAMINOPHEN 5-325 MG PO TABS: 1.0000 | ORAL_TABLET | Freq: Four times a day (QID) | ORAL | 0 refills | Status: DC | PRN

## 2019-08-03 NOTE — Progress Notes (Signed)
   Subjective:    Patient ID: Tyler Craig, male    DOB: 1971-07-15, 48 y.o.   MRN: [id number]  Patient presents for Indigestion (burning in upper abd, R sided epigastric pain- some vomiting with yellow emesis)   Pt here with RUQ pain worse over the fes weeks. He he has intermittant episodes over the years of RUQ pain. He remembers as a kid having severe pain there.   Now his episodes last longer, he will get nausea, pain beneath rib cage, has burning sensation in stomach. He has had vomiting episodes with it.   Last episode was last week Wed/Thursday he still has a little nausea.   He does use BC powders a lot, he has not had in the last week   He has soft stools no diarrhea. No blood in emesis or coffee ground apperance  he does take tums or pepto   Trigger foods- pizza, big meals      Coming back Monday   Review Of Systems:  GEN- denies fatigue, fever, weight loss,weakness, recent illness HEENT- denies eye drainage, change in vision, nasal discharge, CVS- denies chest pain, palpitations RESP- denies SOB, cough, wheeze ABD- + N/V, change in stools, +abd pain GU- denies dysuria, hematuria, dribbling, incontinence MSK- denies joint pain, muscle aches, injury Neuro- denies headache, dizziness, syncope, seizure activity       Objective:    BP 132/68   Pulse 82   Temp 98.7 F (37.1 C) (Temporal)   Resp 14   Ht 5\' 10"  (1.778 m)   Wt 184 lb (83.5 kg)   SpO2 96%   BMI 26.40 kg/m  GEN- NAD, alert and oriented x3 HEENT- PERRL, EOMI, non injected sclera, pink conjunctiva, MMM, oropharynx clear Neck- Supple, no thyromegaly CVS- RRR, no murmur RESP-CTAB ABD-NABS,soft,TTP RUQ,+muprhy EXT- No edema Pulses- Radial, DP- 2+        Assessment & Plan:      Problem List Items Addressed This Visit    None    Visit Diagnoses    RUQ pain    -  Primary   Concern for gallbladder etiology, based on exam and history of intermittant symptoms, also has underlying reflux, likely  some gastritis from NSAID overuse. Start OMeprazole.  Advised him to avoid taking the NSAIDs at this time.  Will obtain a right upper quadrant ultrasound.  I did go ahead and get H. pylori breath test here in the office  As gastritis/reflux/ulcer also on differential  and will repeat his metabolic panel obtain lipase  He states he is going out of town and cant get [place] until next week    Relevant Orders   CBC with Differential   Comprehensive metabolic panel   Lipid Panel   US Abdomen Limited RUQ   H. pylori breath test   Gastroesophageal reflux disease, unspecified whether esophagitis present       Relevant Medications   omeprazole (PRILOSEC) 40 MG capsule   Non-intractable vomiting with nausea, unspecified vomiting type       Relevant Orders   US Abdomen Limited RUQ   H. pylori breath test      Note: This dictation was prepared with Dragon dictation along with smaller phrase technology. Any transcriptional errors that result from this process are unintentional.

## 2019-08-03 NOTE — Patient Instructions (Signed)
Avoid goody/BC powder, ibuprofen Take the omeprazole Ultrasound to be done  F/U pending results

## 2019-08-04 ENCOUNTER — Other Ambulatory Visit: Payer: Self-pay | Admitting: *Deleted

## 2019-08-04 LAB — CBC WITH DIFFERENTIAL/PLATELET
Absolute Monocytes: 718 cells/uL (ref 200–950)
Basophils Absolute: 68 cells/uL (ref 0–200)
Basophils Relative: 0.6 %
Eosinophils Absolute: 103 cells/uL (ref 15–500)
Eosinophils Relative: 0.9 %
HCT: 42.5 % (ref 38.5–50.0)
Hemoglobin: 14.4 g/dL (ref 13.2–17.1)
Lymphs Abs: 2405 cells/uL (ref 850–3900)
MCH: 31.9 pg (ref 27.0–33.0)
MCHC: 33.9 g/dL (ref 32.0–36.0)
MCV: 94.2 fL (ref 80.0–100.0)
MPV: 11 fL (ref 7.5–12.5)
Monocytes Relative: 6.3 %
Neutro Abs: 8105 cells/uL — ABNORMAL HIGH (ref 1500–7800)
Neutrophils Relative %: 71.1 %
Platelets: 300 10*3/uL (ref 140–400)
RBC: 4.51 10*6/uL (ref 4.20–5.80)
RDW: 13.7 % (ref 11.0–15.0)
Total Lymphocyte: 21.1 %
WBC: 11.4 10*3/uL — ABNORMAL HIGH (ref 3.8–10.8)

## 2019-08-04 LAB — COMPREHENSIVE METABOLIC PANEL
AG Ratio: 2 (calc) (ref 1.0–2.5)
ALT: 18 U/L (ref 9–46)
AST: 16 U/L (ref 10–40)
Albumin: 4.6 g/dL (ref 3.6–5.1)
Alkaline phosphatase (APISO): 60 U/L (ref 36–130)
BUN: 13 mg/dL (ref 7–25)
CO2: 25 mmol/L (ref 20–32)
Calcium: 9.9 mg/dL (ref 8.6–10.3)
Chloride: 104 mmol/L (ref 98–110)
Creat: 0.96 mg/dL (ref 0.60–1.35)
Globulin: 2.3 g/dL (calc) (ref 1.9–3.7)
Glucose, Bld: 100 mg/dL — ABNORMAL HIGH (ref 65–99)
Potassium: 4.8 mmol/L (ref 3.5–5.3)
Sodium: 139 mmol/L (ref 135–146)
Total Bilirubin: 0.4 mg/dL (ref 0.2–1.2)
Total Protein: 6.9 g/dL (ref 6.1–8.1)

## 2019-08-04 LAB — LIPID PANEL
Cholesterol: 200 mg/dL — ABNORMAL HIGH (ref <200)
HDL: 40 mg/dL (ref >=40)
LDL Cholesterol (Calc): 134 mg/dL (calc) — ABNORMAL HIGH
Non-HDL Cholesterol (Calc): 160 mg/dL (calc) — ABNORMAL HIGH (ref <130)
Total CHOL/HDL Ratio: 5 (calc) — ABNORMAL HIGH (ref <5.0)
Triglycerides: 131 mg/dL (ref <150)

## 2019-08-04 LAB — H. PYLORI BREATH TEST: H. pylori Breath Test: NOT DETECTED

## 2019-08-04 MED ORDER — HYDROCODONE-ACETAMINOPHEN 5-325 MG PO TABS: 1.0000 | ORAL_TABLET | Freq: Four times a day (QID) | ORAL | 0 refills | Status: DC | PRN

## 2019-08-04 NOTE — Telephone Encounter (Signed)
Received call from patient.   Reports that [facility] is currently out of Hydrocodone. Reports that he is supposed to be leaving town on 08/06/2019, and would like medication changed to another pharmacy.   Call placed to [facility] on [address]. Was advised that medication is in stock and can be filled today.   Ok to re-send prescription?

## 2019-08-05 LAB — LIPASE: Lipase: 49 U/L (ref 7–60)

## 2019-08-12 ENCOUNTER — Ambulatory Visit
Admission: RE | Admit: 2019-08-12 | Discharge: 2019-08-12 | Disposition: A | Discharge status: Home / Self Care | Payer: BC Managed Care – PPO | Source: Ambulatory Visit | Attending: Family Medicine | Admitting: Family Medicine

## 2019-08-12 DIAGNOSIS — K824 Cholesterolosis of gallbladder: Secondary | ICD-10-CM | POA: Diagnosis not present

## 2019-08-12 DIAGNOSIS — R1011 Right upper quadrant pain: Secondary | ICD-10-CM

## 2019-08-12 DIAGNOSIS — R112 Nausea with vomiting, unspecified: Secondary | ICD-10-CM

## 2019-08-12 DIAGNOSIS — K760 Fatty (change of) liver, not elsewhere classified: Secondary | ICD-10-CM | POA: Diagnosis not present

## 2019-08-19 ENCOUNTER — Other Ambulatory Visit: Payer: Self-pay | Admitting: *Deleted

## 2019-08-19 DIAGNOSIS — K824 Cholesterolosis of gallbladder: Secondary | ICD-10-CM

## 2019-08-19 DIAGNOSIS — R1011 Right upper quadrant pain: Secondary | ICD-10-CM

## 2019-08-19 DIAGNOSIS — K219 Gastro-esophageal reflux disease without esophagitis: Secondary | ICD-10-CM

## 2019-08-24 ENCOUNTER — Encounter: Payer: Self-pay | Admitting: Gastroenterology

## 2019-09-03 ENCOUNTER — Other Ambulatory Visit: Payer: Self-pay | Admitting: *Deleted

## 2019-09-03 NOTE — Telephone Encounter (Signed)
Received call from patient.   Requested refill on Hydrocodone/APAP.   Ok to refill??  Last office visit 08/03/2019.  Last refill 08/04/2019.

## 2019-09-04 MED ORDER — HYDROCODONE-ACETAMINOPHEN 5-325 MG PO TABS: 1.0000 | ORAL_TABLET | Freq: Four times a day (QID) | ORAL | 0 refills | Status: DC | PRN

## 2019-09-15 ENCOUNTER — Ambulatory Visit: Payer: BC Managed Care – PPO | Admitting: Nurse Practitioner

## 2019-09-17 ENCOUNTER — Other Ambulatory Visit: Payer: Self-pay | Admitting: Family Medicine

## 2019-09-30 ENCOUNTER — Encounter: Payer: Self-pay | Admitting: Nurse Practitioner

## 2019-09-30 ENCOUNTER — Other Ambulatory Visit: Payer: Self-pay

## 2019-09-30 ENCOUNTER — Ambulatory Visit: Payer: BC Managed Care – PPO | Admitting: Nurse Practitioner

## 2019-09-30 DIAGNOSIS — R109 Unspecified abdominal pain: Secondary | ICD-10-CM | POA: Insufficient documentation

## 2019-09-30 DIAGNOSIS — K219 Gastro-esophageal reflux disease without esophagitis: Secondary | ICD-10-CM | POA: Diagnosis not present

## 2019-09-30 DIAGNOSIS — R1011 Right upper quadrant pain: Secondary | ICD-10-CM | POA: Diagnosis not present

## 2019-09-30 MED ORDER — OMEPRAZOLE 40 MG PO CPDR: 40.0000 mg | DELAYED_RELEASE_CAPSULE | Freq: Two times a day (BID) | ORAL | 1 refills | Status: DC

## 2019-09-30 NOTE — Assessment & Plan Note (Signed)
The description from the patient indicates lifelong GERD symptoms.  This is been progressively worse over the past 1 to 2 years.  He was started on omeprazole 40 mg daily which is helped "substantially".  However, he still has daily breakthrough.  He previously was taking significant amounts of BC powders but is not taking these anymore.  I have cautioned him against use of any NSAIDs.  At this point, given his significant BC powder use, and some ongoing symptoms I will increase his PPI to Prilosec 40 mg twice daily for the next 3 months.  If insurance not cover this dose and frequency we can change to Protonix 40 mg twice daily.  Return for follow-up in 2 months.

## 2019-09-30 NOTE — Progress Notes (Signed)
Primary Care Physician:  Salley Scarlet, MD Primary Gastroenterologist:  Dr. Darrick Penna  Chief Complaint  Patient presents with  . Gastroesophageal Reflux  . Abdominal Pain    HPI:   Tyler Craig is a 49 y.o. male who presents on referral from primary care for right upper quadrant pain and GERD.  Reviewed information associated with referral including primary care office visit date 08/03/2019 for right upper quadrant pain and GERD as well as non-intractable nausea and vomiting.  At that time noted burning in the upper abdomen and right-sided epigastric pain and some vomiting.  Worse over the past few weeks but intermittent over the past several years.  Episodes last longer, associated with nausea.  Does use BC powders a lot though none in the past week.  No hematemesis.  Identified triggers include pizza and big meals.  He uses Tums and/or Pepto-Bismol.  There is some question of biliary etiology, likely some gastritis from NSAID overuse.  Started on omeprazole and avoid NSAIDs.  Right upper quadrant ultrasound ordered as well as H. pylori breath testing.  Labs ordered as well.  Labs completed 08/03/2019 include CBC with mild leukocytosis at 11.4, normal hemoglobin.  CMP essentially normal.  Lipid panel not overtly concerning.  Lipase normal at 49.  H. pylori testing was negative.  Right upper quadrant ultrasound completed 08/12/2019 showed a probable 3 mm gallbladder polyp without shadowing stone or sonographic findings for acute cholecystitis.  Noted fatty liver.  Today he states he's doing ok overall. He has had intermittent epigastric/RUQ pain since childhood. This has been occurring about 7-10 times a year. The last 10 times he has had N/V. This has been worsening over the last 2-3 years. Since starting Prilosec 40 mg daily his symptoms are improved. States it has "helped tremendously" but still with intermittent breakthrough about once a day. Specific triggers include vinegar-based  foods. Denies other abdominal pain other than rare RUQ pain (when the GERD symptoms are the worst). Has some mild constipation and has a bowel movement about 1-2 times a week; stools start hard and progress to looser stools. Does have sensation of incomplete emptying and will often have to return to the bathroom as often as 4-5 more times. Denies hematochezia, melena, fever, chills, unintentional weight loss. Denies URI or flu-like symptoms. Denies loss of sense of taste or smell. Denies chest pain, dyspnea, dizziness, lightheadedness, syncope, near syncope. Denies any other upper or lower GI symptoms.  Previously taking significant BC powders.  Past Medical History:  Diagnosis Date  . Anxiety    takes Fluoxetine daily  . Hyperlipidemia    takes Atorvastatin daily  . Muscle spasm    takes Flexeril as needed  . Numbness    left arm and left leg.Tingling as well  . PONV (postoperative nausea and vomiting)     Past Surgical History:  Procedure Laterality Date  . ANTERIOR CERVICAL DECOMP/DISCECTOMY FUSION N/A 02/04/2017   Procedure: C4-5, C5-6 Anterior Cervical Discectomy and Fusion, Allograft and Plate at J6-2 and Zero P at C5-6;  Surgeon: Eldred Manges, MD;  Location: [facility] OR;  Service: Orthopedics;  Laterality: N/A;  . EYE SURGERY     left eye artificial lens placed  . HERNIA REPAIR    . SPINE SURGERY     neck surgery- has plate    Current Outpatient Medications  Medication Sig Dispense Refill  . atorvastatin (LIPITOR) 40 MG tablet TAKE 1 TABLET BY MOUTH ONCE DAILY 90 tablet 3  .  calcium carbonate (TUMS - DOSED IN MG ELEMENTAL CALCIUM) 500 MG chewable tablet Chew 3 tablets by mouth 2 (two) times daily as needed for indigestion or heartburn.    Marland Kitchen FLUoxetine (PROZAC) 40 MG capsule TAKE 1 CAPSULE BY MOUTH EVERY DAY 90 capsule 0  . HYDROcodone-acetaminophen (NORCO) 5-325 MG tablet Take 1 tablet by mouth every 6 (six) hours as needed for moderate pain. Dx: G89.4, G54.2 45 tablet 0  .  omeprazole (PRILOSEC) 40 MG capsule Take 1 capsule (40 mg total) by mouth daily. FOR ABDOMINAL PAIN 30 capsule 3   No current facility-administered medications for this visit.    Allergies as of 09/30/2019 - Review Complete 09/30/2019  Allergen Reaction Noted  . No known allergies  02/01/2017    Family History  Problem Relation Age of Onset  . Hypertension Mother   . Colon cancer Father 69  . Heart disease Maternal Uncle 9       massive MI  . Heart disease Maternal Grandfather     Social History   Socioeconomic History  . Marital status: Married    Spouse name: Not on file  . Number of children: Not on file  . Years of education: Not on file  . Highest education level: Not on file  Occupational History  . Not on file  Tobacco Use  . Smoking status: Current Every Day Smoker    Packs/day: 1.00    Years: 44.00    Pack years: 44.00    Types: Cigarettes  . Smokeless tobacco: Never Used  Substance and Sexual Activity  . Alcohol use: Not Currently    Comment: occasionally  . Drug use: Not Currently  . Sexual activity: Yes  Other Topics Concern  . Not on file  Social History Narrative  . Not on file   Social Determinants of Health   Financial Resource Strain:   . Difficulty of Paying Living Expenses: Not on file  Food Insecurity:   . Worried About Charity fundraiser in the Last Year: Not on file  . Ran Out of Food in the Last Year: Not on file  Transportation Needs:   . Lack of Transportation (Medical): Not on file  . Lack of Transportation (Non-Medical): Not on file  Physical Activity:   . Days of Exercise per Week: Not on file  . Minutes of Exercise per Session: Not on file  Stress:   . Feeling of Stress : Not on file  Social Connections:   . Frequency of Communication with Friends and Family: Not on file  . Frequency of Social Gatherings with Friends and Family: Not on file  . Attends Religious Services: Not on file  . Active Member of Clubs or  Organizations: Not on file  . Attends Archivist Meetings: Not on file  . Marital Status: Not on file  Intimate Partner Violence:   . Fear of Current or Ex-Partner: Not on file  . Emotionally Abused: Not on file  . Physically Abused: Not on file  . Sexually Abused: Not on file    Review of Systems: General: Negative for anorexia, weight loss, fever, chills, fatigue, weakness. ENT: Negative for hoarseness, difficulty swallowing. CV: Negative for chest pain, angina, palpitations, peripheral edema.  Respiratory: Negative for dyspnea at rest, cough, sputum, wheezing.  GI: See history of present illness. MS: Negative for joint pain, low back pain.  Derm: Negative for rash or itching.  Endo: Negative for unusual weight change.  Heme: Negative for bruising or bleeding.  Allergy: Negative for rash or hives.    Physical Exam: BP 125/71   Pulse 92   Temp (!) 97.5 F (36.4 C) (Temporal)   Ht 5\' 10"  (1.778 m)   Wt 187 lb (84.8 kg)   BMI 26.83 kg/m  General:   Alert and oriented. Pleasant and cooperative. Well-nourished and well-developed.  Head:  Normocephalic and atraumatic. Eyes:  Without icterus, sclera clear and conjunctiva pink.  Ears:  Normal auditory acuity. Cardiovascular:  S1, S2 present without murmurs appreciated. Extremities without clubbing or edema. Respiratory:  Clear to auscultation bilaterally. No wheezes, rales, or rhonchi. No distress.  Gastrointestinal:  +BS, soft, non-tender and non-distended. No HSM noted. No guarding or rebound. No masses appreciated.  Rectal:  Deferred  Musculoskalatal:  Symmetrical without gross deformities. Neurologic:  Alert and oriented x4;  grossly normal neurologically. Psych:  Alert and cooperative. Normal mood and affect. Heme/Lymph/Immune: No excessive bruising noted.    09/30/2019 3:45 PM   Disclaimer: This note was dictated with voice recognition software. Similar sounding words can inadvertently be transcribed and may  not be corrected upon review.

## 2019-09-30 NOTE — Assessment & Plan Note (Signed)
The patient describes significant right upper quadrant pain in addition to his GERD/burning symptoms.  Right upper quadrant ultrasound revealed only a single gallbladder polyp, no stones, or acute cholecystitis.  He has changed his diet which is helped.  At this point, given ongoing GERD symptoms, we will treat his GERD as per above.  If when he follows up he is still having significant right upper quadrant pain despite better GERD management we can consider other possible etiologies such as biliary dyskinesia for which we can order a HIDA scan.  Further recommendations will follow.  Hopefully, his right upper quadrant pain is associated with his GERD symptoms and will improve along with his GERD improvement.  Follow-up in 2 months.

## 2019-09-30 NOTE — Patient Instructions (Addendum)
Your health issues we discussed today were:   GERD (reflux/heartburn): 1. I am increasing Prilosec to 40 mg twice daily.  Take this for sing in the morning and 30 minutes for your last meal the day 2. If your insurance will not cover this we will switch you to Protonix 40 mg twice daily 3. Continue to avoid trigger foods that make your symptoms worse 4. Avoid all NSAIDs (ibuprofen, Motrin, Advil, Aleve, naproxen, Naprosyn, aspirin, BC powders, Goody powders and anything with "NSAID" on the bottle). 5. Further diet information for typical triggers for GERD are printed below 6. Call us if you have any worsening or severe symptoms  Right upper abdominal pain: 1. Hopefully your pain will improve with the improvement in your reflux symptoms 2. If your reflux improves more with the medication change as per above, but your pain remains and is persistent we can pursue other testing of your gallbladder 3. Call us if you have any worsening or severe symptoms  Overall I recommend:  1. Continue your other current medications 2. Return for follow-up in 2 months 3. Call us if you have any questions or concerns.   COVID-19 Vaccine Information can be found at: [email or web address] For questions related to vaccine distribution or appointments, please email [email or web address] or call [phone].    At [facility] we value your feedback. You may receive a survey about your visit today. Please share your experience as we strive to create trusting relationships with our patients to provide genuine, compassionate, quality care.  We appreciate your understanding and patience as we review any laboratory studies, imaging, and other diagnostic tests that are ordered as we care for you. Our office policy is 5 business days for review of these results, and any emergent or urgent results are addressed in a timely manner for your best  interest. If you do not hear from our office in 1 week, please contact us.   We also encourage the use of MyChart, which contains your medical information for your review as well. If you are not enrolled in this feature, an access code is on this after visit summary for your convenience. Thank you for allowing [place] to be involved in your care.  It was great to see you today!  I hope you have a Happy New Year!!       Food Choices for Gastroesophageal Reflux Disease, Adult When you have gastroesophageal reflux disease (GERD), the foods you eat and your eating habits are very important. Choosing the right foods can help ease the discomfort of GERD. Consider working with a diet and nutrition specialist (dietitian) to help you make healthy food choices. What general guidelines should I follow?  Eating plan  Choose healthy foods low in fat, such as fruits, vegetables, whole grains, low-fat dairy products, and lean meat, fish, and poultry.  Eat frequent, small meals instead of three large meals each day. Eat your meals slowly, in a relaxed setting. Avoid bending over or lying down until 2-3 hours after eating.  Limit high-fat foods such as fatty meats or fried foods.  Limit your intake of oils, butter, and shortening to less than 8 teaspoons each day.  Avoid the following: ? Foods that cause symptoms. These may be different for different people. Keep a food diary to keep track of foods that cause symptoms. ? Alcohol. ? Drinking large amounts of liquid with meals. ? Eating meals during the 2-3 hours before bed.  Cook foods using methods other than frying. This may  include baking, grilling, or broiling. Lifestyle  Maintain a healthy weight. Ask your health care provider what weight is healthy for you. If you need to lose weight, work with your health care provider to do so safely.  Exercise for at least 30 minutes on 5 or more days each week, or as told by your health care provider.  Avoid  wearing clothes that fit tightly around your waist and chest.  Do not use any products that contain nicotine or tobacco, such as cigarettes and e-cigarettes. If you need help quitting, ask your health care provider.  Sleep with the head of your bed raised. Use a wedge under the mattress or blocks under the bed frame to raise the head of the bed. What foods are not recommended? The items listed may not be a complete list. Talk with your dietitian about what dietary choices are best for you. Grains Pastries or quick breads with added fat. [place] toast. Vegetables Deep fried vegetables. [place] fries. Any vegetables prepared with added fat. Any vegetables that cause symptoms. For some people this may include tomatoes and tomato products, chili peppers, onions and garlic, and horseradish. Fruits Any fruits prepared with added fat. Any fruits that cause symptoms. For some people this may include citrus fruits, such as oranges, grapefruit, pineapple, and lemons. Meats and other protein foods High-fat meats, such as fatty beef or pork, hot dogs, ribs, ham, sausage, salami and bacon. Fried meat or protein, including fried fish and fried chicken. Nuts and nut butters. Dairy Whole milk and chocolate milk. Sour cream. Cream. Ice cream. Cream cheese. Milk shakes. Beverages Coffee and tea, with or without caffeine. Carbonated beverages. Sodas. Energy drinks. Fruit juice made with acidic fruits (such as orange or grapefruit). Tomato juice. Alcoholic drinks. Fats and oils Butter. Margarine. Shortening. Ghee. Sweets and desserts Chocolate and cocoa. Donuts. Seasoning and other foods Pepper. Peppermint and spearmint. Any condiments, herbs, or seasonings that cause symptoms. For some people, this may include curry, hot sauce, or vinegar-based salad dressings. Summary  When you have gastroesophageal reflux disease (GERD), food and lifestyle choices are very important to help ease the discomfort of  GERD.  Eat frequent, small meals instead of three large meals each day. Eat your meals slowly, in a relaxed setting. Avoid bending over or lying down until 2-3 hours after eating.  Limit high-fat foods such as fatty meat or fried foods. This information is not intended to replace advice given to you by your health care provider. Make sure you discuss any questions you have with your health care provider. Document Revised: 12/25/2018 Document Reviewed: 09/04/2016 Elsevier Patient Education  [place].

## 2019-10-01 NOTE — Progress Notes (Signed)
Cc'ed to pcp °

## 2019-10-05 ENCOUNTER — Other Ambulatory Visit: Payer: Self-pay | Admitting: *Deleted

## 2019-10-05 MED ORDER — HYDROCODONE-ACETAMINOPHEN 5-325 MG PO TABS: 1.0000 | ORAL_TABLET | Freq: Four times a day (QID) | ORAL | 0 refills | Status: DC | PRN

## 2019-10-05 NOTE — Telephone Encounter (Signed)
Received call from patient.   Requested refill on Hydrocodone/APAP.  Ok to refill??  Last office visit 08/03/2019.  Last refill 09/04/2019.

## 2019-10-15 ENCOUNTER — Other Ambulatory Visit: Payer: BC Managed Care – PPO

## 2019-10-15 ENCOUNTER — Other Ambulatory Visit: Payer: Self-pay

## 2019-10-15 DIAGNOSIS — E782 Mixed hyperlipidemia: Secondary | ICD-10-CM

## 2019-10-16 ENCOUNTER — Encounter: Payer: Self-pay | Admitting: *Deleted

## 2019-10-16 LAB — COMPLETE METABOLIC PANEL WITH GFR
AG Ratio: 2.2 (calc) (ref 1.0–2.5)
ALT: 19 U/L (ref 9–46)
AST: 14 U/L (ref 10–40)
Albumin: 4.3 g/dL (ref 3.6–5.1)
Alkaline phosphatase (APISO): 67 U/L (ref 36–130)
BUN: 12 mg/dL (ref 7–25)
CO2: 27 mmol/L (ref 20–32)
Calcium: 9.5 mg/dL (ref 8.6–10.3)
Chloride: 105 mmol/L (ref 98–110)
Creat: 0.87 mg/dL (ref 0.60–1.35)
GFR, Est African American: 119 mL/min/{1.73_m2} (ref >=60)
GFR, Est Non African American: 103 mL/min/{1.73_m2} (ref >=60)
Globulin: 2 g/dL (calc) (ref 1.9–3.7)
Glucose, Bld: 97 mg/dL (ref 65–99)
Potassium: 4.6 mmol/L (ref 3.5–5.3)
Sodium: 139 mmol/L (ref 135–146)
Total Bilirubin: 0.6 mg/dL (ref 0.2–1.2)
Total Protein: 6.3 g/dL (ref 6.1–8.1)

## 2019-10-16 LAB — CBC WITH DIFFERENTIAL/PLATELET
Absolute Monocytes: 633 cells/uL (ref 200–950)
Basophils Absolute: 56 cells/uL (ref 0–200)
Basophils Relative: 0.5 %
Eosinophils Absolute: 455 cells/uL (ref 15–500)
Eosinophils Relative: 4.1 %
HCT: 42.8 % (ref 38.5–50.0)
Hemoglobin: 14.5 g/dL (ref 13.2–17.1)
Lymphs Abs: 1954 cells/uL (ref 850–3900)
MCH: 32 pg (ref 27.0–33.0)
MCHC: 33.9 g/dL (ref 32.0–36.0)
MCV: 94.5 fL (ref 80.0–100.0)
MPV: 10.6 fL (ref 7.5–12.5)
Monocytes Relative: 5.7 %
Neutro Abs: 8003 cells/uL — ABNORMAL HIGH (ref 1500–7800)
Neutrophils Relative %: 72.1 %
Platelets: 286 10*3/uL (ref 140–400)
RBC: 4.53 10*6/uL (ref 4.20–5.80)
RDW: 13.5 % (ref 11.0–15.0)
Total Lymphocyte: 17.6 %
WBC: 11.1 10*3/uL — ABNORMAL HIGH (ref 3.8–10.8)

## 2019-10-16 LAB — LIPID PANEL
Cholesterol: 173 mg/dL (ref <200)
HDL: 37 mg/dL — ABNORMAL LOW (ref >=40)
LDL Cholesterol (Calc): 114 mg/dL (calc) — ABNORMAL HIGH
Non-HDL Cholesterol (Calc): 136 mg/dL (calc) — ABNORMAL HIGH (ref <130)
Total CHOL/HDL Ratio: 4.7 (calc) (ref <5.0)
Triglycerides: 112 mg/dL (ref <150)

## 2019-11-04 ENCOUNTER — Other Ambulatory Visit: Payer: Self-pay | Admitting: *Deleted

## 2019-11-04 MED ORDER — HYDROCODONE-ACETAMINOPHEN 5-325 MG PO TABS: 1.0000 | ORAL_TABLET | Freq: Four times a day (QID) | ORAL | 0 refills | Status: DC | PRN

## 2019-11-04 NOTE — Telephone Encounter (Signed)
Received call from patient.   Requested refill on Hydrocodone/APAP.  Ok to refill??  Last office visit 10/05/2019.  Last refill 08/03/2019.

## 2019-12-01 ENCOUNTER — Other Ambulatory Visit: Payer: Self-pay

## 2019-12-01 ENCOUNTER — Encounter: Payer: Self-pay | Admitting: Nurse Practitioner

## 2019-12-01 ENCOUNTER — Ambulatory Visit: Payer: BC Managed Care – PPO | Admitting: Nurse Practitioner

## 2019-12-01 VITALS — BP 123/72 | HR 82 | Temp 96.9°F | Ht 70.0 in | Wt 189.8 lb

## 2019-12-01 DIAGNOSIS — K219 Gastro-esophageal reflux disease without esophagitis: Secondary | ICD-10-CM

## 2019-12-01 DIAGNOSIS — R1011 Right upper quadrant pain: Secondary | ICD-10-CM

## 2019-12-01 NOTE — Assessment & Plan Note (Signed)
Abdominal pain essentially resolved at this time with improvement in GERD.  Continue to monitor and follow-up in 6 months.

## 2019-12-01 NOTE — Patient Instructions (Addendum)
Your health issues we discussed today were:   GERD (reflux/heartburn) with abdominal pain: 1. Continue to avoid all NSAIDs (ibuprofen, Motrin, Advil, Aleve, naproxen, Naprosyn, BC powders, Goody powders) 2. The only medication you can take over-the-counter for pain is Tylenol 3. Continue taking your acid blocker twice daily 4. Let us know if you have any worsening or severe symptoms 5. Further recommendations related to diet are below  Overall I recommend:  1. Continue your other current medications 2. Return for follow-up in 6 months 3. Call us for any questions or concerns   ---------------------------------------------------------------  COVID-19 Vaccine Information can be found at: [email or web address].[email or web address] For questions related to vaccine distribution or appointments, please email [email or web address] or call [phone].   ---------------------------------------------------------------   At [facility] we value your feedback. You may receive a survey about your visit today. Please share your experience as we strive to create trusting relationships with our patients to provide genuine, compassionate, quality care.  We appreciate your understanding and patience as we review any laboratory studies, imaging, and other diagnostic tests that are ordered as we care for you. Our office policy is 5 business days for review of these results, and any emergent or urgent results are addressed in a timely manner for your best interest. If you do not hear from our office in 1 week, please contact us.   We also encourage the use of MyChart, which contains your medical information for your review as well. If you are not enrolled in this feature, an access code is on this after visit summary for your convenience. Thank you for allowing [place] to be involved in your care.  It was great to see you today!  I hope you have a great  day!!      Food Choices for Gastroesophageal Reflux Disease, Adult When you have gastroesophageal reflux disease (GERD), the foods you eat and your eating habits are very important. Choosing the right foods can help ease the discomfort of GERD. Consider working with a diet and nutrition specialist (dietitian) to help you make healthy food choices. What general guidelines should I follow?  Eating plan  Choose healthy foods low in fat, such as fruits, vegetables, whole grains, low-fat dairy products, and lean meat, fish, and poultry.  Eat frequent, small meals instead of three large meals each day. Eat your meals slowly, in a relaxed setting. Avoid bending over or lying down until 2-3 hours after eating.  Limit high-fat foods such as fatty meats or fried foods.  Limit your intake of oils, butter, and shortening to less than 8 teaspoons each day.  Avoid the following: ? Foods that cause symptoms. These may be different for different people. Keep a food diary to keep track of foods that cause symptoms. ? Alcohol. ? Drinking large amounts of liquid with meals. ? Eating meals during the 2-3 hours before bed.  Cook foods using methods other than frying. This may include baking, grilling, or broiling. Lifestyle  Maintain a healthy weight. Ask your health care provider what weight is healthy for you. If you need to lose weight, work with your health care provider to do so safely.  Exercise for at least 30 minutes on 5 or more days each week, or as told by your health care provider.  Avoid wearing clothes that fit tightly around your waist and chest.  Do not use any products that contain nicotine or tobacco, such as cigarettes and e-cigarettes. If you need help quitting, ask your health care provider.  Sleep with the head of your bed raised. Use a wedge under the mattress or blocks under the bed frame to raise the head of the bed. What foods are not recommended? The items listed may not  be a complete list. Talk with your dietitian about what dietary choices are best for you. Grains Pastries or quick breads with added fat. [place] toast. Vegetables Deep fried vegetables. [place] fries. Any vegetables prepared with added fat. Any vegetables that cause symptoms. For some people this may include tomatoes and tomato products, chili peppers, onions and garlic, and horseradish. Fruits Any fruits prepared with added fat. Any fruits that cause symptoms. For some people this may include citrus fruits, such as oranges, grapefruit, pineapple, and lemons. Meats and other protein foods High-fat meats, such as fatty beef or pork, hot dogs, ribs, ham, sausage, salami and bacon. Fried meat or protein, including fried fish and fried chicken. Nuts and nut butters. Dairy Whole milk and chocolate milk. Sour cream. Cream. Ice cream. Cream cheese. Milk shakes. Beverages Coffee and tea, with or without caffeine. Carbonated beverages. Sodas. Energy drinks. Fruit juice made with acidic fruits (such as orange or grapefruit). Tomato juice. Alcoholic drinks. Fats and oils Butter. Margarine. Shortening. Ghee. Sweets and desserts Chocolate and cocoa. Donuts. Seasoning and other foods Pepper. Peppermint and spearmint. Any condiments, herbs, or seasonings that cause symptoms. For some people, this may include curry, hot sauce, or vinegar-based salad dressings. Summary  When you have gastroesophageal reflux disease (GERD), food and lifestyle choices are very important to help ease the discomfort of GERD.  Eat frequent, small meals instead of three large meals each day. Eat your meals slowly, in a relaxed setting. Avoid bending over or lying down until 2-3 hours after eating.  Limit high-fat foods such as fatty meat or fried foods. This information is not intended to replace advice given to you by your health care provider. Make sure you discuss any questions you have with your health care provider. Document  Revised: 12/25/2018 Document Reviewed: 09/04/2016 Elsevier Patient Education  2020 [employer].

## 2019-12-01 NOTE — Progress Notes (Signed)
Referring Provider: Salley Scarlet, MD Primary Care Physician:  Salley Scarlet, MD Primary GI:  Dr. Darrick Penna  Chief Complaint  Patient presents with  . Gastroesophageal Reflux    doing ok    HPI:   Tyler Craig is a 49 y.o. male who presents for follow-up on GERD and right upper quadrant pain.  The patient was last seen in our office 09/30/2019 for the same.  Primary care initially noted progressive symptoms in the setting of frequent BC powder use for which she was started on omeprazole.  H. pylori testing with primary care was negative.  Right upper quadrant ultrasound 08/12/2019 with probable 3 mm gallbladder polyp without shadowing stone, sonographic findings for acute cholecystitis.  Noted fatty liver.  At his last visit noted epigastric/right upper quadrant pain since childhood that has been occurring about 7-10 times a year.  Recently he has had worsening over the past 2 to 3 years and some intermittent associated vomiting with nausea.  Symptoms improved on Prilosec but still with intermittent breakthrough about once a day.  Has been trying to avoid known triggers.  Has right upper quadrant pain when his GERD symptoms are at their worst.  Mild constipation with a bowel movement 1-2 times a week with hard stools and progression to looser stools.  Does note sensation of incomplete emptying.  Recommended avoid all NSAIDs, increase Prilosec to 40 mg twice daily, call for any worsening or significant symptoms (can consider biliary work-up).  Follow-up in 2 months otherwise.  Today he states he's doing well overall. Continues to avoid NSAIDs and is on Prilosec 40 mg twice daily. Has been eating better. No breakthrough GERD symptoms on PPI bid. Denies abdominal pain, N/V, hematochezia, melena, fever, chills, unintentional weight loss. Denies URI or flu-like symptoms. Denies loss of sense of taste or smell. Has not been tested for COVID-19. Hasn't received COVID-19 vaccination. Denies chest  pain, dyspnea, dizziness, lightheadedness, syncope, near syncope. Denies any other upper or lower GI symptoms.  Past Medical History:  Diagnosis Date  . Anxiety    takes Fluoxetine daily  . Hyperlipidemia    takes Atorvastatin daily  . Muscle spasm    takes Flexeril as needed  . Numbness    left arm and left leg.Tingling as well  . PONV (postoperative nausea and vomiting)     Past Surgical History:  Procedure Laterality Date  . ANTERIOR CERVICAL DECOMP/DISCECTOMY FUSION N/A 02/04/2017   Procedure: C4-5, C5-6 Anterior Cervical Discectomy and Fusion, Allograft and Plate at K9-9 and Zero P at C5-6;  Surgeon: Eldred Manges, MD;  Location: [facility] OR;  Service: Orthopedics;  Laterality: N/A;  . EYE SURGERY     left eye artificial lens placed  . HERNIA REPAIR    . SPINE SURGERY     neck surgery- has plate    Current Outpatient Medications  Medication Sig Dispense Refill  . atorvastatin (LIPITOR) 40 MG tablet TAKE 1 TABLET BY MOUTH ONCE DAILY 90 tablet 3  . calcium carbonate (TUMS - DOSED IN MG ELEMENTAL CALCIUM) 500 MG chewable tablet Chew 3 tablets by mouth 2 (two) times daily as needed for indigestion or heartburn.    Marland Kitchen FLUoxetine (PROZAC) 40 MG capsule TAKE 1 CAPSULE BY MOUTH EVERY DAY 90 capsule 0  . HYDROcodone-acetaminophen (NORCO) 5-325 MG tablet Take 1 tablet by mouth every 6 (six) hours as needed for moderate pain. Dx: G89.4, G54.2 45 tablet 0  . omeprazole (PRILOSEC) 40 MG capsule Take 1  capsule (40 mg total) by mouth 2 (two) times daily. 180 capsule 1   No current facility-administered medications for this visit.    Allergies as of 12/01/2019 - Review Complete 12/01/2019  Allergen Reaction Noted  . No known allergies  02/01/2017    Family History  Problem Relation Age of Onset  . Hypertension Mother   . Colon cancer Father 38  . Heart disease Maternal Uncle 30       massive MI  . Heart disease Maternal Grandfather     Social History   Socioeconomic History  .  Marital status: Married    Spouse name: Not on file  . Number of children: Not on file  . Years of education: Not on file  . Highest education level: Not on file  Occupational History  . Not on file  Tobacco Use  . Smoking status: Current Every Day Smoker    Packs/day: 1.00    Years: 44.00    Pack years: 44.00    Types: Cigarettes  . Smokeless tobacco: Never Used  Substance and Sexual Activity  . Alcohol use: Not Currently    Comment: occasionally  . Drug use: Not Currently  . Sexual activity: Yes  Other Topics Concern  . Not on file  Social History Narrative  . Not on file   Social Determinants of Health   Financial Resource Strain:   . Difficulty of Paying Living Expenses:   Food Insecurity:   . Worried About Charity fundraiser in the Last Year:   . Arboriculturist in the Last Year:   Transportation Needs:   . Film/video editor (Medical):   Marland Kitchen Lack of Transportation (Non-Medical):   Physical Activity:   . Days of Exercise per Week:   . Minutes of Exercise per Session:   Stress:   . Feeling of Stress :   Social Connections:   . Frequency of Communication with Friends and Family:   . Frequency of Social Gatherings with Friends and Family:   . Attends Religious Services:   . Active Member of Clubs or Organizations:   . Attends Archivist Meetings:   Marland Kitchen Marital Status:     Review of Systems: General: Negative for anorexia, weight loss, fever, chills, fatigue, weakness. ENT: Negative for hoarseness, difficulty swallowing. CV: Negative for chest pain, angina, palpitations, peripheral edema.  Respiratory: Negative for dyspnea at rest, cough, sputum, wheezing.  GI: See history of present illness. Endo: Negative for unusual weight change.  Heme: Negative for bruising or bleeding. Allergy: Negative for rash or hives.   Physical Exam: BP 123/72   Pulse 82   Temp (!) 96.9 F (36.1 C) (Temporal)   Ht 5\' 10"  (1.778 m)   Wt 189 lb 12.8 oz (86.1 kg)    BMI 27.23 kg/m  General:   Alert and oriented. Pleasant and cooperative. Well-nourished and well-developed.  Eyes:  Without icterus, sclera clear and conjunctiva pink.  Ears:  Normal auditory acuity. Cardiovascular:  S1, S2 present without murmurs appreciated. Respiratory:  Clear to auscultation bilaterally. No wheezes, rales, or rhonchi. No distress.  Gastrointestinal:  +BS, soft, non-tender and non-distended. No HSM noted. No guarding or rebound. No masses appreciated.  Rectal:  Deferred  Musculoskalatal:  Symmetrical without gross deformities. Neurologic:  Alert and oriented x4;  grossly normal neurologically. Psych:  Alert and cooperative. Normal mood and affect. Heme/Lymph/Immune: No excessive bruising noted.    12/01/2019 3:28 PM   Disclaimer: This note was dictated with  voice recognition software. Similar sounding words can inadvertently be transcribed and may not be corrected upon review.

## 2019-12-01 NOTE — Assessment & Plan Note (Signed)
GERD symptoms significantly improved with Prilosec 40 mg twice daily.  No breakthrough symptoms.  No longer taking aspirin powders or NSAIDs.  I recommend he continue to abstain from NSAIDs.  Continue PPI twice daily for now.  We will see him in follow-up we can try to reduce to once daily and see after a fair amount of time for mucosal healing if he can be maintained on once daily or even may be be able to come off it altogether.  Follow-up in 6 months.

## 2019-12-02 ENCOUNTER — Other Ambulatory Visit: Payer: Self-pay | Admitting: *Deleted

## 2019-12-02 ENCOUNTER — Encounter: Payer: Self-pay | Admitting: Gastroenterology

## 2019-12-02 MED ORDER — HYDROCODONE-ACETAMINOPHEN 5-325 MG PO TABS: 1.0000 | ORAL_TABLET | Freq: Four times a day (QID) | ORAL | 0 refills | Status: DC | PRN

## 2019-12-02 NOTE — Telephone Encounter (Signed)
Received call from patient.   Requested refill on Hydrocodone/APAP.   Ok to refill??  Last office visit 08/03/2019.  Last refill 11/04/2019.

## 2020-01-04 ENCOUNTER — Other Ambulatory Visit: Payer: Self-pay | Admitting: *Deleted

## 2020-01-04 MED ORDER — HYDROCODONE-ACETAMINOPHEN 5-325 MG PO TABS: 1.0000 | ORAL_TABLET | Freq: Four times a day (QID) | ORAL | 0 refills | Status: DC | PRN

## 2020-01-04 NOTE — Telephone Encounter (Signed)
Received call from patient.   Requested refill on Hydrocodone/APAP.  Ok to refill??  Last office visit 08/03/2019.  Last refill 12/02/2019.

## 2020-01-11 ENCOUNTER — Encounter: Payer: BC Managed Care – PPO | Admitting: Family Medicine

## 2020-01-11 NOTE — Progress Notes (Deleted)
   Subjective:    Patient ID: Tyler Craig, male    DOB: [birth year], 49 y.o.   MRN: [id number]  Patient presents for No chief complaint on file.   Pt here for CPE, medications and history reviewed   Recent labs reviewed- Jan lipids improved, LDL 114, TG 112  CMET was normal, he had mildly elevated WBC at 11.1   taking lipitor 40mg  at bedtime    Fatty liver noted on in Nov along with gallbladder polyp, seen by GI due to ongoing RUQ pain and GERD symptoms, is on Prilosec 40  BID    Chronic back pain s/p cervical fusion with DDD cervical/lumbar region - on norco prn   MDD- taking prozac 40mg  once a day   Immunizations- TDAP/FLU UTD, discussed COVID19 vaccine      Review Of Systems:  GEN- denies fatigue, fever, weight loss,weakness, recent illness HEENT- denies eye drainage, change in vision, nasal discharge, CVS- denies chest pain, palpitations RESP- denies SOB, cough, wheeze ABD- denies N/V, change in stools, abd pain GU- denies dysuria, hematuria, dribbling, incontinence MSK- denies joint pain, muscle aches, injury Neuro- denies headache, dizziness, syncope, seizure activity       Objective:    There were no vitals taken for this visit. GEN- NAD, alert and oriented x3 HEENT- PERRL, EOMI, non injected sclera, pink conjunctiva, MMM, oropharynx clear Neck- Supple, no thyromegaly CVS- RRR, no murmur RESP-CTAB ABD-NABS,soft,NT,ND EXT- No edema Pulses- Radial, DP- 2+        Assessment & Plan:      Problem List Items Addressed This Visit      Unprioritized   GERD (gastroesophageal reflux disease)   Hyperlipidemia   MDD (major depressive disorder)    Other Visit Diagnoses    Routine general medical examination at a health care facility    -  Primary      Note: This dictation was prepared with Dragon dictation along with smaller phrase technology. Any transcriptional errors that result from this process are unintentional.

## 2020-01-18 ENCOUNTER — Other Ambulatory Visit: Payer: Self-pay | Admitting: Family Medicine

## 2020-02-01 ENCOUNTER — Other Ambulatory Visit: Payer: Self-pay | Admitting: *Deleted

## 2020-02-01 MED ORDER — HYDROCODONE-ACETAMINOPHEN 5-325 MG PO TABS: 1.0000 | ORAL_TABLET | Freq: Four times a day (QID) | ORAL | 0 refills | Status: DC | PRN

## 2020-02-01 NOTE — Telephone Encounter (Signed)
Received call from patient.   Requested refill on Hydrocodone/APAP.   Ok to refill??  Last office visit 08/03/2019.  Last refill 01/04/2020.

## 2020-02-25 ENCOUNTER — Other Ambulatory Visit: Payer: Self-pay | Admitting: Family Medicine

## 2020-02-25 DIAGNOSIS — R1011 Right upper quadrant pain: Secondary | ICD-10-CM

## 2020-02-25 DIAGNOSIS — K219 Gastro-esophageal reflux disease without esophagitis: Secondary | ICD-10-CM

## 2020-03-02 ENCOUNTER — Other Ambulatory Visit: Payer: Self-pay | Admitting: *Deleted

## 2020-03-02 MED ORDER — HYDROCODONE-ACETAMINOPHEN 5-325 MG PO TABS: 1.0000 | ORAL_TABLET | Freq: Four times a day (QID) | ORAL | 0 refills | Status: DC | PRN

## 2020-03-02 NOTE — Telephone Encounter (Signed)
Received call from patient.   Requested refill on hydrocodone/APAP.  Ok to refill??  Last office visit 08/03/2019.  Last refill 02/01/2020.

## 2020-03-30 ENCOUNTER — Other Ambulatory Visit: Payer: Self-pay | Admitting: Family Medicine

## 2020-03-30 DIAGNOSIS — R1011 Right upper quadrant pain: Secondary | ICD-10-CM

## 2020-03-30 DIAGNOSIS — K219 Gastro-esophageal reflux disease without esophagitis: Secondary | ICD-10-CM

## 2020-03-30 MED ORDER — HYDROCODONE-ACETAMINOPHEN 5-325 MG PO TABS: 1.0000 | ORAL_TABLET | Freq: Four times a day (QID) | ORAL | 0 refills | Status: DC | PRN

## 2020-03-30 NOTE — Telephone Encounter (Signed)
Received call from patient.   Requested refill on Hydrocodone/APAP.   Ok to refill??  Last office visit 08/03/2019.  Last refill 03/02/2020.

## 2020-04-14 ENCOUNTER — Other Ambulatory Visit: Payer: Self-pay | Admitting: Family Medicine

## 2020-04-20 ENCOUNTER — Encounter: Payer: Self-pay | Admitting: Family Medicine

## 2020-04-20 ENCOUNTER — Other Ambulatory Visit: Payer: Self-pay

## 2020-04-20 ENCOUNTER — Ambulatory Visit (INDEPENDENT_AMBULATORY_CARE_PROVIDER_SITE_OTHER): Payer: BC Managed Care – PPO | Admitting: Family Medicine

## 2020-04-20 VITALS — BP 134/74 | HR 88 | Temp 97.9°F | Resp 16 | Ht 70.0 in | Wt 184.0 lb

## 2020-04-20 DIAGNOSIS — M5030 Other cervical disc degeneration, unspecified cervical region: Secondary | ICD-10-CM

## 2020-04-20 DIAGNOSIS — F411 Generalized anxiety disorder: Secondary | ICD-10-CM

## 2020-04-20 DIAGNOSIS — F339 Major depressive disorder, recurrent, unspecified: Secondary | ICD-10-CM

## 2020-04-20 DIAGNOSIS — R1011 Right upper quadrant pain: Secondary | ICD-10-CM

## 2020-04-20 DIAGNOSIS — Z0000 Encounter for general adult medical examination without abnormal findings: Secondary | ICD-10-CM | POA: Diagnosis not present

## 2020-04-20 DIAGNOSIS — K219 Gastro-esophageal reflux disease without esophagitis: Secondary | ICD-10-CM | POA: Diagnosis not present

## 2020-04-20 DIAGNOSIS — E782 Mixed hyperlipidemia: Secondary | ICD-10-CM

## 2020-04-20 DIAGNOSIS — Z0001 Encounter for general adult medical examination with abnormal findings: Secondary | ICD-10-CM | POA: Diagnosis not present

## 2020-04-20 DIAGNOSIS — M5136 Other intervertebral disc degeneration, lumbar region: Secondary | ICD-10-CM | POA: Diagnosis not present

## 2020-04-20 DIAGNOSIS — Z981 Arthrodesis status: Secondary | ICD-10-CM

## 2020-04-20 MED ORDER — OMEPRAZOLE 40 MG PO CPDR: DELAYED_RELEASE_CAPSULE | ORAL | 3 refills | Status: DC

## 2020-04-20 MED ORDER — HYDROCODONE-ACETAMINOPHEN 5-325 MG PO TABS: 1.0000 | ORAL_TABLET | Freq: Four times a day (QID) | ORAL | 0 refills | Status: DC | PRN

## 2020-04-20 MED ORDER — CELECOXIB 100 MG PO CAPS
100.0000 mg | ORAL_CAPSULE | Freq: Every day | ORAL | 3 refills | Status: DC
Start: 2020-04-20 — End: 2020-08-15

## 2020-04-20 NOTE — Assessment & Plan Note (Signed)
Continue prozac He is talking to someone at his church which he feels helps

## 2020-04-20 NOTE — Assessment & Plan Note (Signed)
GERD with gastritis but due to severe chronic pain Still taking NSAIDS in order to function/work Trial of celebrex 100mg  once a day Increase omeprazole to 40mg  BID D/C BC powders

## 2020-04-20 NOTE — Progress Notes (Signed)
Subjective:    Patient ID: Tyler Craig, male    DOB: [birth year], 49 y.o.   MRN: [id number]  Patient presents for Annual Exam (is fasting)   Pt here for CPE, medications and history reviewed    Hyperlipidemia- taking lipitor 40mg  without any SE   MDD/GAD- taking prozac, still doesn't sleep well    Chronic back pain- , he gets hip pain mostly in the right and shoukder pain but doesn't want to pursue any surgery  he tells me that his grandmother died on the operating table so he doesn't want to have any other surgical intervention. His pain is all day long, it is affecting his family life too. He is mentally and physically fatigued.  He has been taking BC powders daily despite the GI upset as it is the only thing that helps the most, also takes norco twice a day     GERD- maintained on prilosec 40mg  , followed by GI, the nausea is also improved, he still gets intermittant RUQ His symptoms were better on twice a day dosing    Discussed COVID-19 vaccine, TDAP UTD   Review Of Systems:  GEN- denies fatigue, fever, weight loss,weakness, recent illness HEENT- denies eye drainage, change in vision, nasal discharge, CVS- denies chest pain, palpitations RESP- denies SOB, cough, wheeze ABD- denies N/V, change in stools, abd pain GU- denies dysuria, hematuria, dribbling, incontinence MSK- + joint pain, muscle aches, injury Neuro- denies headache, dizziness, syncope, seizure activity       Objective:    BP 134/74   Pulse 88   Temp 97.9 F (36.6 C) (Temporal)   Resp 16   Ht 5\' 10"  (1.778 m)   Wt 184 lb (83.5 kg)   SpO2 97%   BMI 26.40 kg/m  GEN- NAD, alert and oriented x3, weight down 5lbs HEENT- PERRL, EOMI, non injected sclera, pink conjunctiva, MMM, oropharynx clear, TM clear no effusion  Neck- Supple, no thyromegaly CVS- RRR, no murmur RESP-CTAB ABD-NABS,soft,NT,ND Psych- stressed appearing,n o SI, well groomed, good eye contact ,pHQ 9 score 14  EXT- No  edema Pulses- Radial, DP- 2+        Assessment & Plan:      Problem List Items Addressed This Visit      Unprioritized   Abdominal pain   Relevant Medications   omeprazole (PRILOSEC) 40 MG capsule   DDD (degenerative disc disease), cervical    chornic pain , increase norco #60 tabs, take BID In addition to low dose NSAID  His pain is also at center of his depression , unable to be active at home, 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 Will see if improving pain , will help mood as well  He also admits he doesn't eat very much during the day while working and pain decreases his appetite       Relevant Medications   celecoxib (CELEBREX) 100 MG capsule   HYDROcodone-acetaminophen (NORCO) 5-325 MG tablet   DDD (degenerative disc disease), lumbar   Relevant Medications   celecoxib (CELEBREX) 100 MG capsule   HYDROcodone-acetaminophen (NORCO) 5-325 MG tablet   GAD (generalized anxiety disorder)   GERD (gastroesophageal reflux disease)    GERD with gastritis but due to severe chronic pain Still taking NSAIDS in order to function/work Trial of celebrex 100mg  once a day Increase omeprazole to 40mg  BID D/C BC powders       Relevant Medications   omeprazole (PRILOSEC) 40 MG capsule   Hyperlipidemia   MDD (major depressive  disorder)    Continue prozac He is talking to someone at his church which he feels helps      S/P cervical spinal fusion    Other Visit Diagnoses    Routine general medical examination at a health care facility    -  Primary   CPE done, discussed COVID-19 vaccine, pt will consider , HEP C/HIV Screen today with labs    Relevant Orders   CBC with Differential/Platelet   Comprehensive metabolic panel   Lipid panel   HIV Antibody (routine testing w rflx)   Hepatitis C antibody      Note: This dictation was prepared with Dragon dictation along with smaller phrase technology. Any transcriptional errors that result from this process are unintentional.

## 2020-04-20 NOTE — Assessment & Plan Note (Addendum)
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

## 2020-04-20 NOTE — Patient Instructions (Signed)
PAIN MEDS changed to twice a day Start celebrex once a day in place [facility] powder Increase omeprazole to twice a day to protect your stomach  F/U 4 months

## 2020-04-21 LAB — CBC WITH DIFFERENTIAL/PLATELET
Absolute Monocytes: 547 cells/uL (ref 200–950)
Basophils Absolute: 58 cells/uL (ref 0–200)
Basophils Relative: 0.6 %
Eosinophils Absolute: 154 cells/uL (ref 15–500)
Eosinophils Relative: 1.6 %
HCT: 44.7 % (ref 38.5–50.0)
Hemoglobin: 14.9 g/dL (ref 13.2–17.1)
Lymphs Abs: 1718 cells/uL (ref 850–3900)
MCH: 31.7 pg (ref 27.0–33.0)
MCHC: 33.3 g/dL (ref 32.0–36.0)
MCV: 95.1 fL (ref 80.0–100.0)
MPV: 10.8 fL (ref 7.5–12.5)
Monocytes Relative: 5.7 %
Neutro Abs: 7123 cells/uL (ref 1500–7800)
Neutrophils Relative %: 74.2 %
Platelets: 342 10*3/uL (ref 140–400)
RBC: 4.7 10*6/uL (ref 4.20–5.80)
RDW: 14 % (ref 11.0–15.0)
Total Lymphocyte: 17.9 %
WBC: 9.6 10*3/uL (ref 3.8–10.8)

## 2020-04-21 LAB — COMPREHENSIVE METABOLIC PANEL
AG Ratio: 1.5 (calc) (ref 1.0–2.5)
ALT: 19 U/L (ref 9–46)
AST: 15 U/L (ref 10–40)
Albumin: 4.2 g/dL (ref 3.6–5.1)
Alkaline phosphatase (APISO): 77 U/L (ref 36–130)
BUN: 10 mg/dL (ref 7–25)
CO2: 26 mmol/L (ref 20–32)
Calcium: 9.8 mg/dL (ref 8.6–10.3)
Chloride: 105 mmol/L (ref 98–110)
Creat: 0.95 mg/dL (ref 0.60–1.35)
Globulin: 2.8 g/dL (calc) (ref 1.9–3.7)
Glucose, Bld: 106 mg/dL — ABNORMAL HIGH (ref 65–99)
Potassium: 5.1 mmol/L (ref 3.5–5.3)
Sodium: 139 mmol/L (ref 135–146)
Total Bilirubin: 0.4 mg/dL (ref 0.2–1.2)
Total Protein: 7 g/dL (ref 6.1–8.1)

## 2020-04-21 LAB — HIV ANTIBODY (ROUTINE TESTING W REFLEX): HIV 1&2 Ab, 4th Generation: NONREACTIVE

## 2020-04-21 LAB — LIPID PANEL
Cholesterol: 188 mg/dL (ref <200)
HDL: 35 mg/dL — ABNORMAL LOW (ref >=40)
LDL Cholesterol (Calc): 131 mg/dL (calc) — ABNORMAL HIGH
Non-HDL Cholesterol (Calc): 153 mg/dL (calc) — ABNORMAL HIGH (ref <130)
Total CHOL/HDL Ratio: 5.4 (calc) — ABNORMAL HIGH (ref <5.0)
Triglycerides: 113 mg/dL (ref <150)

## 2020-04-21 LAB — HEPATITIS C ANTIBODY
Hepatitis C Ab: NONREACTIVE
SIGNAL TO CUT-OFF: 0.05 (ref <1.00)

## 2020-05-19 ENCOUNTER — Other Ambulatory Visit: Payer: Self-pay | Admitting: *Deleted

## 2020-05-19 NOTE — Telephone Encounter (Signed)
Received call from patient.   Requested refill on Hydrocodone/APAP.   Ok to refill??  Last office visit/ refill 04/20/2020.

## 2020-05-20 MED ORDER — HYDROCODONE-ACETAMINOPHEN 5-325 MG PO TABS: 1.0000 | ORAL_TABLET | Freq: Four times a day (QID) | ORAL | 0 refills | Status: DC | PRN

## 2020-06-09 ENCOUNTER — Encounter: Payer: Self-pay | Admitting: Internal Medicine

## 2020-06-09 ENCOUNTER — Telehealth: Payer: Self-pay | Admitting: *Deleted

## 2020-06-09 ENCOUNTER — Telehealth (INDEPENDENT_AMBULATORY_CARE_PROVIDER_SITE_OTHER): Payer: BC Managed Care – PPO | Admitting: Nurse Practitioner

## 2020-06-09 ENCOUNTER — Encounter: Payer: Self-pay | Admitting: Nurse Practitioner

## 2020-06-09 ENCOUNTER — Other Ambulatory Visit: Payer: Self-pay

## 2020-06-09 DIAGNOSIS — K219 Gastro-esophageal reflux disease without esophagitis: Secondary | ICD-10-CM | POA: Diagnosis not present

## 2020-06-09 DIAGNOSIS — R112 Nausea with vomiting, unspecified: Secondary | ICD-10-CM | POA: Diagnosis not present

## 2020-06-09 DIAGNOSIS — R1011 Right upper quadrant pain: Secondary | ICD-10-CM

## 2020-06-09 NOTE — Telephone Encounter (Signed)
Pt consented to a virtual visit. 

## 2020-06-09 NOTE — Progress Notes (Signed)
Referring Provider: Salley Scarlet, MD Primary Care Physician:  Salley Scarlet, MD Primary GI:  Dr. Marletta Lor  NOTE: Service was provided via telemedicine and was requested by the patient due to COVID-19 pandemic.  Patient Location: Home  Provider Location: [facility] office  Reason for Phone Visit: Follow-up  Persons present on the phone encounter, with roles: Patient, myself (provider), Mindy Estudillo, CMA (updated meds and allergies)  Total time (minutes) spent on medical discussion: 23 minutes  Due to COVID-19, visit was conducted using the virtual method noted. Visit was requested by patient.  I connected with  Michaela Corner on 06/09/20 at 11:00 AM EDT by video visit and verified that I am speaking with the correct person using two identifiers.   I discussed the limitations, risks, security and privacy concerns of performing an evaluation and management service by telephone and the availability of in person appointments. I also discussed with the patient that there may be a patient responsible charge related to this service. The patient expressed understanding and agreed to proceed.  Chief Complaint  Patient presents with  . Gastroesophageal Reflux    doing fine  . Abdominal Pain    still ongoing issue, RUQ    HPI:   Tyler Craig is a 49 y.o. male who presents for virtual visit regarding: GERD and abdominal pain.  Patient was last seen in our office 12/01/2019 for the same.  Previous frequent BC powder use, H. pylori testing by primary care negative.  Right upper quadrant ultrasound in November 2020 with gallbladder polyp without shadowing stone, fatty liver, or evidence of cholecystitis.  Noted history of epigastric/right upper quadrant abdominal pain since childhood with recent worsening that improved on Prilosec but has intermittent breakthrough.  History of constipation that is mild with sensation of incomplete emptying and only 1-2 stools a week but are hard and  progressed to looser stools after initiation.  At his last visit continues to avoid NSAIDs and is on Prilosec 40 mg twice daily, eating better without breakthrough symptoms on PPI twice daily.  No other overt GI complaints.  Recommended continue current medications, strict avoidance of NSAIDs, and follow-up in 6 months.  Today states she is doing okay overall. GERD symptoms doing well. Still with RUQ abdominal discomfort. No breakthrough GERD symptoms on PPI. RUQ discomfort occurs about twice a month and lasts typically 6 hours (sharp) and sore for a few days, then resolves. Cannot think of any specific triggers. Has tried dietary changes without improvement. This has been essentially life-long. Still has his gallbladder. Back in the day felt greasy/fatty foods were causative so he cut back which has helped the regularity but still with symptoms. Uses Ensure bid and a regular dinner, which has helped him lose weight. He will have N/V with his pain. Denies hematochezia, melena, fever, chills, unintentional weight loss. He was at hsi sister's house a few days ago and his sister tested positive yesterday for COVID-19; she is on quarantine. He began having low grade fever and head cold symptoms. He states if he doesn't feel any worse he will not get tested because he can't be on quarantine. The patient has not received COVID-19 vaccination(s). They are not interested in vaccine scheduling information. Denies chest pain, dyspnea, dizziness, lightheadedness, syncope, near syncope. Denies any other upper or lower GI symptoms.  Past Medical History:  Diagnosis Date  . Anxiety    takes Fluoxetine daily  . Hyperlipidemia    takes Atorvastatin daily  . Muscle  spasm    takes Flexeril as needed  . Numbness    left arm and left leg.Tingling as well  . PONV (postoperative nausea and vomiting)     Past Surgical History:  Procedure Laterality Date  . ANTERIOR CERVICAL DECOMP/DISCECTOMY FUSION N/A 02/04/2017    Procedure: C4-5, C5-6 Anterior Cervical Discectomy and Fusion, Allograft and Plate at N0-5 and Zero P at C5-6;  Surgeon: Eldred Manges, MD;  Location: [facility] OR;  Service: Orthopedics;  Laterality: N/A;  . EYE SURGERY     left eye artificial lens placed  . HERNIA REPAIR    . SPINE SURGERY     neck surgery- has plate    Current Outpatient Medications  Medication Sig Dispense Refill  . atorvastatin (LIPITOR) 40 MG tablet TAKE 1 TABLET BY MOUTH ONCE DAILY 90 tablet 3  . calcium carbonate (TUMS - DOSED IN MG ELEMENTAL CALCIUM) 500 MG chewable tablet Chew 3 tablets by mouth 2 (two) times daily as needed for indigestion or heartburn.    . celecoxib (CELEBREX) 100 MG capsule Take 1 capsule (100 mg total) by mouth daily. 30 capsule 3  . FLUoxetine (PROZAC) 40 MG capsule TAKE 1 CAPSULE BY MOUTH EVERY DAY 90 capsule 0  . HYDROcodone-acetaminophen (NORCO) 5-325 MG tablet Take 1 tablet by mouth every 6 (six) hours as needed for moderate pain. Dx: G89.4, G54.2 60 tablet 0  . omeprazole (PRILOSEC) 40 MG capsule Take 1 capsule BID (Patient taking differently: Take 40 mg by mouth in the morning and at bedtime. ) 60 capsule 3   No current facility-administered medications for this visit.    Allergies as of 06/09/2020 - Review Complete 06/09/2020  Allergen Reaction Noted  . No known allergies  02/01/2017    Family History  Problem Relation Age of Onset  . Hypertension Mother   . Colon cancer Father 14  . Heart disease Maternal Uncle 48       massive MI  . Heart disease Maternal Grandfather     Social History   Socioeconomic History  . Marital status: Married    Spouse name: Not on file  . Number of children: Not on file  . Years of education: Not on file  . Highest education level: Not on file  Occupational History  . Not on file  Tobacco Use  . Smoking status: Current Every Day Smoker    Packs/day: 1.00    Years: 44.00    Pack years: 44.00    Types: Cigarettes  . Smokeless tobacco: Never  Used  Vaping Use  . Vaping Use: Never used  Substance and Sexual Activity  . Alcohol use: Not Currently    Comment: occasionally  . Drug use: Not Currently  . Sexual activity: Yes  Other Topics Concern  . Not on file  Social History Narrative  . Not on file   Social Determinants of Health   Financial Resource Strain:   . Difficulty of Paying Living Expenses: Not on file  Food Insecurity:   . Worried About Programme researcher, broadcasting/film/video in the Last Year: Not on file  . Ran Out of Food in the Last Year: Not on file  Transportation Needs:   . Lack of Transportation (Medical): Not on file  . Lack of Transportation (Non-Medical): Not on file  Physical Activity:   . Days of Exercise per Week: Not on file  . Minutes of Exercise per Session: Not on file  Stress:   . Feeling of Stress : Not on  file  Social Connections:   . Frequency of Communication with Friends and Family: Not on file  . Frequency of Social Gatherings with Friends and Family: Not on file  . Attends Religious Services: Not on file  . Active Member of Clubs or Organizations: Not on file  . Attends BankerClub or Organization Meetings: Not on file  . Marital Status: Not on file    Review of Systems: Review of Systems  Constitutional: Negative for chills, fever, malaise/fatigue and weight loss.  HENT: Negative for congestion and sore throat.   Respiratory: Negative for cough and shortness of breath.   Cardiovascular: Negative for chest pain and palpitations.  Gastrointestinal: Negative for abdominal pain, blood in stool, diarrhea, melena, nausea and vomiting.  Musculoskeletal: Negative for joint pain and myalgias.  Skin: Negative for rash.  Neurological: Negative for dizziness and weakness.  Endo/Heme/Allergies: Does not bruise/bleed easily.  Psychiatric/Behavioral: Negative for depression. The patient is not nervous/anxious.   All other systems reviewed and are negative.   Physical Exam: Note: limited exam due to virtual  visit There were no vitals taken for this visit. Physical Exam Vitals and nursing note reviewed.  Constitutional:      General: He is not in acute distress.    Appearance: Normal appearance. He is not ill-appearing, toxic-appearing or diaphoretic.  HENT:     Head: Normocephalic and atraumatic.     Nose: No congestion or rhinorrhea.  Eyes:     General: No scleral icterus. Cardiovascular:     Rate and Rhythm: Normal rate and regular rhythm.     Heart sounds: Normal heart sounds.  Pulmonary:     Effort: Pulmonary effort is normal.     Breath sounds: Normal breath sounds.  Abdominal:     General: Bowel sounds are normal. There is no distension.     Palpations: Abdomen is soft. There is no hepatomegaly, splenomegaly or mass.     Tenderness: There is no abdominal tenderness. There is no guarding or rebound.     Hernia: No hernia is present.  Musculoskeletal:     Cervical back: Neck supple.  Skin:    General: Skin is warm and dry.     Coloration: Skin is not jaundiced.     Findings: No bruising or rash.  Neurological:     General: No focal deficit present.     Mental Status: He is alert and oriented to person, place, and time. Mental status is at baseline.  Psychiatric:        Mood and Affect: Mood normal.        Behavior: Behavior normal.        Thought Content: Thought content normal.       Assessment: Very pleasant 49 year old male who were connected with virtually today.  He recently changed his appointment from an office to virtual due to possible Covid exposure and some mild symptoms that began yesterday.  For his GI concerns, he does have a history of GERD and lifelong history of right upper quadrant discomfort.  GERD symptoms are doing well on PPI and is not really having any breakthrough.  He does have intermittent right upper quadrant discomfort that starts sharp for a day, resolves to dull/sore for couple days and then resolves.  Earlier in his life when he was eating  more greasy foods this was more persistent and more regular.  He has since changed his diet and does not have symptoms as often.  He is able to tolerate the pain and "  deal with it".  No identified associations with his symptoms recently.  He does have nausea and vomiting with his symptoms.  No other overt GI complaints or red flag symptoms.  Right upper quadrant ultrasound essentially unremarkable recently.  At this point I think we should proceed with a HIDA scan for biliary dyskinesia given his classic symptoms.  I will have him continue his current medications.  We can make recommendations based on his results.  Follow-up in 3 months otherwise.    Plan:  1. Continue current medications 2. HIDA scan 3. Follow-up in 3 months 4. Call for worsening symptoms.     Thank you for allowing [place] to participate in the care of Melissa K Kathleen Lime, DNP, AGNP-C Adult & Gerontological Nurse Practitioner [facility]

## 2020-06-09 NOTE — Telephone Encounter (Signed)
Tyler Craig, you are scheduled for a virtual visit with your provider today.  Just as we do with appointments in the office, we must obtain your consent to participate.  Your consent will be active for this visit and any virtual visit you may have with one of our providers in the next 365 days.  If you have a MyChart account, I can also send a copy of this consent to you electronically.  All virtual visits are billed to your insurance company just like a traditional visit in the office.  As this is a virtual visit, video technology does not allow for your provider to perform a traditional examination.  This may limit your provider's ability to fully assess your condition.  If your provider identifies any concerns that need to be evaluated in person or the need to arrange testing such as labs, EKG, etc, we will make arrangements to do so.  Although advances in technology are sophisticated, we cannot ensure that it will always work on either your end or our end.  If the connection with a video visit is poor, we may have to switch to a telephone visit.  With either a video or telephone visit, we are not always able to ensure that we have a secure connection.   I need to obtain your verbal consent now.   Are you willing to proceed with your visit today?

## 2020-06-09 NOTE — Patient Instructions (Signed)
Your health issues we discussed today were:   Right upper abdominal pain with nausea/vomiting: 1. I will schedule a HIDA scan to check for gallbladder function 2. Further recommendations will follow your test.  Depending on your results we may recommend you discuss possible removing your gallbladder with the surgeon 3. We can discuss further at your office visit 4. Call us for any worsening symptoms  GERD (reflux/heartburn): 1. I am glad your heartburn symptoms are doing better 2. Continue your current medications 3. Call us for any worsening or recurrent symptoms  Overall I recommend:  1. Continue your other current medications 2. Return for follow-up in 3 months 3. Call us for any questions or concerns   ---------------------------------------------------------------  I am glad you have gotten your COVID-19 vaccination!  Even though you are fully vaccinated you should continue to follow CDC and state/local guidelines.  ---------------------------------------------------------------   At [facility] we value your feedback. You may receive a survey about your visit today. Please share your experience as we strive to create trusting relationships with our patients to provide genuine, compassionate, quality care.  We appreciate your understanding and patience as we review any laboratory studies, imaging, and other diagnostic tests that are ordered as we care for you. Our office policy is 5 business days for review of these results, and any emergent or urgent results are addressed in a timely manner for your best interest. If you do not hear from our office in 1 week, please contact us.   We also encourage the use of MyChart, which contains your medical information for your review as well. If you are not enrolled in this feature, an access code is on this after visit summary for your convenience. Thank you for allowing [place] to be involved in your care.  It was great to see  you today!  I hope you have a great Fall!!

## 2020-06-09 NOTE — Telephone Encounter (Signed)
HIDA scheduled for 9/30 at 10:00am, arrival 9:45am, npo midnight, no pain meds after midnight  Patient aware of appt details. Voiced understanding

## 2020-06-16 ENCOUNTER — Other Ambulatory Visit: Payer: Self-pay | Admitting: *Deleted

## 2020-06-16 ENCOUNTER — Encounter (HOSPITAL_COMMUNITY): Payer: BC Managed Care – PPO

## 2020-06-16 NOTE — Telephone Encounter (Signed)
Received call from patient.   Requested refill on Hydrocodone/APAP  Ok to refill??  Last office visit 04/20/2020.  Last refill 05/20/2020.

## 2020-06-17 MED ORDER — HYDROCODONE-ACETAMINOPHEN 5-325 MG PO TABS: 1.0000 | ORAL_TABLET | Freq: Four times a day (QID) | ORAL | 0 refills | Status: DC | PRN

## 2020-07-13 ENCOUNTER — Other Ambulatory Visit: Payer: Self-pay | Admitting: Family Medicine

## 2020-07-18 ENCOUNTER — Other Ambulatory Visit: Payer: Self-pay | Admitting: *Deleted

## 2020-07-18 NOTE — Telephone Encounter (Signed)
Received call from patient.   Requested refill on Hydrocodone/APAP.  Ok to refill??  Last office visit 04/20/2020.  Last refill 06/17/2020.

## 2020-07-19 MED ORDER — HYDROCODONE-ACETAMINOPHEN 5-325 MG PO TABS: 1.0000 | ORAL_TABLET | Freq: Four times a day (QID) | ORAL | 0 refills | Status: DC | PRN

## 2020-08-14 ENCOUNTER — Other Ambulatory Visit: Payer: Self-pay | Admitting: Family Medicine

## 2020-08-17 ENCOUNTER — Telehealth: Payer: Self-pay | Admitting: *Deleted

## 2020-08-17 ENCOUNTER — Other Ambulatory Visit: Payer: Self-pay | Admitting: *Deleted

## 2020-08-17 MED ORDER — HYDROCODONE-ACETAMINOPHEN 5-325 MG PO TABS: 1.0000 | ORAL_TABLET | Freq: Four times a day (QID) | ORAL | 0 refills | Status: DC | PRN

## 2020-08-17 NOTE — Telephone Encounter (Signed)
Received call from patient.   Requested refill on Hydrocodone/APAP.   Ok to refill??  Last office visit 04/20/2020.  Last refill 07/19/2020.

## 2020-08-17 NOTE — Telephone Encounter (Signed)
Schedule OV

## 2020-08-17 NOTE — Telephone Encounter (Addendum)
Received call from patient.   Reports that he has some spasms in his R hand and arm at night. States that it also feels like thumb, first and second fingers have intermittent numbness. Reports that it is not painful, but is more uncomfortable and irritating. States that it feels like spasms are worse at night after working and it will seem like arm will jump during spasm.   Reports that he has had cervical spine fusion in 2018, so he knows that it is most likely nerve damage.   Reports that he does not want to go to Neurosurgery at this time.   Advised that he can use OTC Vit B complex for nerve pain.   MD please advise.

## 2020-08-17 NOTE — Telephone Encounter (Signed)
Patient states that he does not want any treatment at this time. States that he was onl;y making MD aware.

## 2020-08-26 ENCOUNTER — Ambulatory Visit (INDEPENDENT_AMBULATORY_CARE_PROVIDER_SITE_OTHER): Payer: BC Managed Care – PPO | Admitting: Family Medicine

## 2020-08-26 ENCOUNTER — Encounter: Payer: Self-pay | Admitting: Family Medicine

## 2020-08-26 ENCOUNTER — Other Ambulatory Visit: Payer: Self-pay

## 2020-08-26 VITALS — BP 120/82 | HR 98 | Temp 98.2°F | Resp 16 | Ht 70.0 in | Wt 184.0 lb

## 2020-08-26 DIAGNOSIS — Z23 Encounter for immunization: Secondary | ICD-10-CM

## 2020-08-26 DIAGNOSIS — F339 Major depressive disorder, recurrent, unspecified: Secondary | ICD-10-CM | POA: Diagnosis not present

## 2020-08-26 DIAGNOSIS — M5136 Other intervertebral disc degeneration, lumbar region: Secondary | ICD-10-CM

## 2020-08-26 DIAGNOSIS — K219 Gastro-esophageal reflux disease without esophagitis: Secondary | ICD-10-CM

## 2020-08-26 DIAGNOSIS — R7301 Impaired fasting glucose: Secondary | ICD-10-CM | POA: Diagnosis not present

## 2020-08-26 DIAGNOSIS — F17200 Nicotine dependence, unspecified, uncomplicated: Secondary | ICD-10-CM | POA: Diagnosis not present

## 2020-08-26 DIAGNOSIS — E782 Mixed hyperlipidemia: Secondary | ICD-10-CM | POA: Diagnosis not present

## 2020-08-26 MED ORDER — CYCLOBENZAPRINE HCL 10 MG PO TABS: 10.0000 mg | ORAL_TABLET | Freq: Three times a day (TID) | ORAL | 1 refills | Status: DC | PRN

## 2020-08-26 MED ORDER — CELECOXIB 200 MG PO CAPS: ORAL_CAPSULE | ORAL | 3 refills | Status: DC

## 2020-08-26 MED ORDER — ATORVASTATIN CALCIUM 80 MG PO TABS: ORAL_TABLET | ORAL | 1 refills | Status: DC

## 2020-08-26 NOTE — Assessment & Plan Note (Signed)
Depression worsened by pain/fatigue Continue prozac He gets counseling through church

## 2020-08-26 NOTE — Progress Notes (Signed)
Subjective:    Patient ID: Tyler Craig, male    DOB: 20-May-1971, 49 y.o.   MRN: [id number]  Patient presents for Follow-up (Is fasting)  Hyperlipidemia- taking lipitor 80mg  without any SE, last visit in August increased LDL was 131 , pt smoker    MDD/GAD- taking prozac, still doesn't sleep well, has chronic pain, but doesn't want to change anything     Chronic back pain / shoulder pain, neck pain,  - he is still taking more BC powder because they work fast-, though celebrex does help with some of his joint pain . He doesn't want to pursue shoulder surgery  he tells me that his grandmother died on the operating table so he doesn't want to have any other surgical intervention.    GERD- maintained on prilosec 40mg  , followed by GI, the nausea is also improved,  Review Of Systems:  GEN- denies fatigue, fever, weight loss,weakness, recent illness HEENT- denies eye drainage, change in vision, nasal discharge, CVS- denies chest pain, palpitations RESP- denies SOB, cough, wheeze ABD- denies N/V, change in stools, abd pain GU- denies dysuria, hematuria, dribbling, incontinence MSK- + joint pain, muscle aches, injury Neuro- denies headache, dizziness, syncope, seizure activity       Objective:    BP 120/82   Pulse 98   Temp 98.2 F (36.8 C) (Temporal)   Resp 16   Ht 5\' 10"  (1.778 m)   Wt 184 lb (83.5 kg)   SpO2 98%   BMI 26.40 kg/m  GEN- NAD, alert and oriented x3 HEENT- PERRL, EOMI, non injected sclera, pink conjunctiva, MMM, oropharynx clear Neck- Supple, no thyromegaly CVS- RRR, no murmur RESP-CTAB ABD-NABS,soft,NT,ND Psych depressed affect, not anxious, no hallucinations, very polite, no SI  PHQ 9 score 13  MSK- Decreased ROM RUE compared to left, fair ROM neck/ Spine  EXT- No edema Pulses- Radial, DP- 2+        Assessment & Plan:      Problem List Items Addressed This Visit      Unprioritized   DDD (degenerative disc disease), lumbar - Primary     Chronic pain, continue meds His pain is all day long, it is affecting his family life too. He is mentally and physically fatigued.  He has been taking BC powders daily despite the GI upset as it is the only thing that helps the most, also takes norco twice a day  Will increase cebrex to 200mg  once a day    He also complained of spasms some days Will give flexeril prn at bedtime        Relevant Medications   celecoxib (CELEBREX) 200 MG capsule   cyclobenzaprine (FLEXERIL) 10 MG tablet   GERD (gastroesophageal reflux disease)    Continue protonix  Discussed stopping BC Prefer to have him on monitored low dose NSAID Celebrex, will change to 200mg  once a day, advised no BC powders      Hyperlipidemia    lipitor 80mg  Recheck labs today      Relevant Medications   atorvastatin (LIPITOR) 80 MG tablet   Other Relevant Orders   Comprehensive metabolic panel   Lipid panel   CBC with Differential/Platelet   MDD (major depressive disorder)    Depression worsened by pain/fatigue Continue prozac He gets counseling through church      Smoker    counseled on tobacco cessation, he is not ready to quit        Other Visit Diagnoses    Elevated fasting  glucose       Relevant Orders   Hemoglobin A1c   Need for immunization against influenza       Relevant Orders   Flu Vaccine QUAD 36+ mos IM (Completed)      Note: This dictation was prepared with Dragon dictation along with smaller phrase technology. Any transcriptional errors that result from this process are unintentional.

## 2020-08-26 NOTE — Patient Instructions (Addendum)
F/U 4 months Flu shot given Celebrex increased to 200mg   We will call with lab results Flexeril for muscle spasm

## 2020-08-26 NOTE — Assessment & Plan Note (Signed)
Continue protonix  Discussed stopping BC Prefer to have him on monitored low dose NSAID Celebrex, will change to 200mg  once a day, advised no BC powders

## 2020-08-26 NOTE — Assessment & Plan Note (Signed)
counseled on tobacco cessation, he is not ready to quit  

## 2020-08-26 NOTE — Assessment & Plan Note (Addendum)
Chronic pain, continue meds His pain is all day long, it is affecting his family life too. He is mentally and physically fatigued.  He has been taking BC powders daily despite the GI upset as it is the only thing that helps the most, also takes norco twice a day  Will increase cebrex to 200mg  once a day    He also complained of spasms some days Will give flexeril prn at bedtime

## 2020-08-26 NOTE — Assessment & Plan Note (Signed)
lipitor 80mg  Recheck labs today

## 2020-08-27 LAB — CBC WITH DIFFERENTIAL/PLATELET
Absolute Monocytes: 523 cells/uL (ref 200–950)
Basophils Absolute: 62 cells/uL (ref 0–200)
Basophils Relative: 0.8 %
Eosinophils Absolute: 257 cells/uL (ref 15–500)
Eosinophils Relative: 3.3 %
HCT: 42.9 % (ref 38.5–50.0)
Hemoglobin: 14.6 g/dL (ref 13.2–17.1)
Lymphs Abs: 1778 cells/uL (ref 850–3900)
MCH: 31.9 pg (ref 27.0–33.0)
MCHC: 34 g/dL (ref 32.0–36.0)
MCV: 93.9 fL (ref 80.0–100.0)
MPV: 10.8 fL (ref 7.5–12.5)
Monocytes Relative: 6.7 %
Neutro Abs: 5179 cells/uL (ref 1500–7800)
Neutrophils Relative %: 66.4 %
Platelets: 328 10*3/uL (ref 140–400)
RBC: 4.57 10*6/uL (ref 4.20–5.80)
RDW: 14.2 % (ref 11.0–15.0)
Total Lymphocyte: 22.8 %
WBC: 7.8 10*3/uL (ref 3.8–10.8)

## 2020-08-27 LAB — COMPREHENSIVE METABOLIC PANEL
AG Ratio: 1.8 (calc) (ref 1.0–2.5)
ALT: 24 U/L (ref 9–46)
AST: 18 U/L (ref 10–40)
Albumin: 4.5 g/dL (ref 3.6–5.1)
Alkaline phosphatase (APISO): 73 U/L (ref 36–130)
BUN: 13 mg/dL (ref 7–25)
CO2: 27 mmol/L (ref 20–32)
Calcium: 10 mg/dL (ref 8.6–10.3)
Chloride: 107 mmol/L (ref 98–110)
Creat: 0.87 mg/dL (ref 0.60–1.35)
Globulin: 2.5 g/dL (calc) (ref 1.9–3.7)
Glucose, Bld: 106 mg/dL — ABNORMAL HIGH (ref 65–99)
Potassium: 5.7 mmol/L — ABNORMAL HIGH (ref 3.5–5.3)
Sodium: 141 mmol/L (ref 135–146)
Total Bilirubin: 0.4 mg/dL (ref 0.2–1.2)
Total Protein: 7 g/dL (ref 6.1–8.1)

## 2020-08-27 LAB — LIPID PANEL
Cholesterol: 186 mg/dL (ref <200)
HDL: 43 mg/dL (ref >=40)
LDL Cholesterol (Calc): 125 mg/dL (calc) — ABNORMAL HIGH
Non-HDL Cholesterol (Calc): 143 mg/dL (calc) — ABNORMAL HIGH (ref <130)
Total CHOL/HDL Ratio: 4.3 (calc) (ref <5.0)
Triglycerides: 82 mg/dL (ref <150)

## 2020-08-27 LAB — HEMOGLOBIN A1C
Hgb A1c MFr Bld: 5.8 % of total Hgb — ABNORMAL HIGH (ref <5.7)
Mean Plasma Glucose: 120 mg/dL
eAG (mmol/L): 6.6 mmol/L

## 2020-08-30 ENCOUNTER — Other Ambulatory Visit: Payer: Self-pay | Admitting: *Deleted

## 2020-08-30 DIAGNOSIS — E875 Hyperkalemia: Secondary | ICD-10-CM

## 2020-08-31 ENCOUNTER — Other Ambulatory Visit: Payer: Self-pay

## 2020-08-31 ENCOUNTER — Other Ambulatory Visit: Payer: BC Managed Care – PPO

## 2020-08-31 DIAGNOSIS — E875 Hyperkalemia: Secondary | ICD-10-CM

## 2020-08-31 LAB — BASIC METABOLIC PANEL
BUN: 12 mg/dL (ref 7–25)
CO2: 29 mmol/L (ref 20–32)
Calcium: 9.9 mg/dL (ref 8.6–10.3)
Chloride: 104 mmol/L (ref 98–110)
Creat: 0.89 mg/dL (ref 0.60–1.35)
Glucose, Bld: 83 mg/dL (ref 65–99)
Potassium: 5 mmol/L (ref 3.5–5.3)
Sodium: 139 mmol/L (ref 135–146)

## 2020-09-08 IMAGING — US US ABDOMEN LIMITED
1 series · 14 of 25 positions shown · non-contrast
Comparison: CT of the abdomen pelvis dated 04/14/2002.

CLINICAL DATA: 48-year-old male with right upper quadrant abdominal
pain.

EXAM:
ULTRASOUND ABDOMEN LIMITED RIGHT UPPER QUADRANT

[Series 1: us abdomen limited · 0.17mm/px · 14 of 41 slices shown]
[im 1/41]
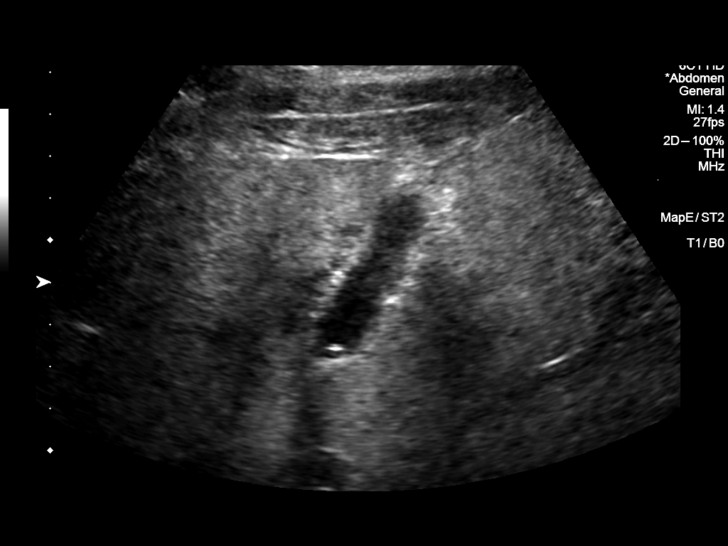
[im 4/41]
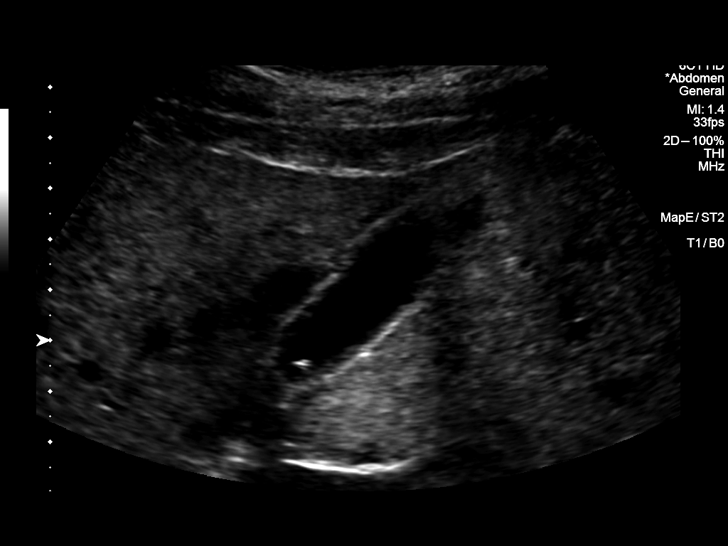
[im 7/41]
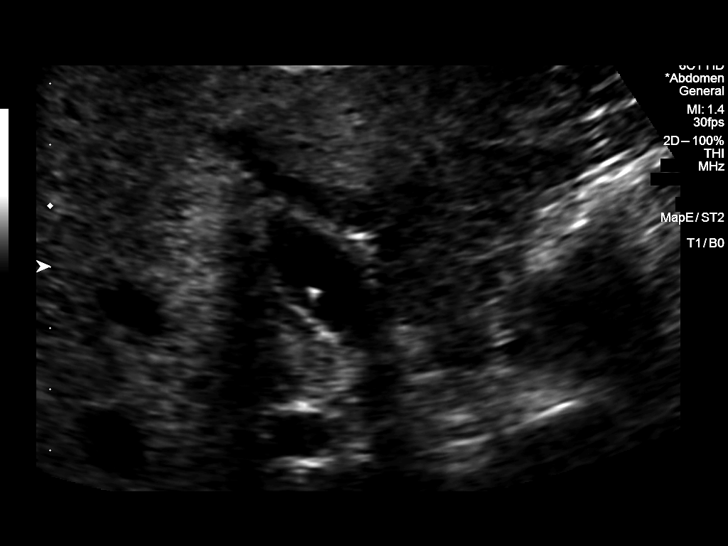
[im 11/41]
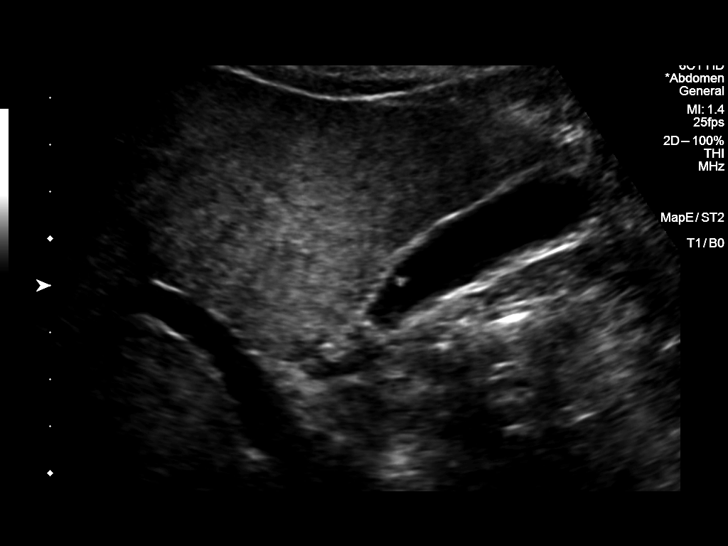
[im 14/41]
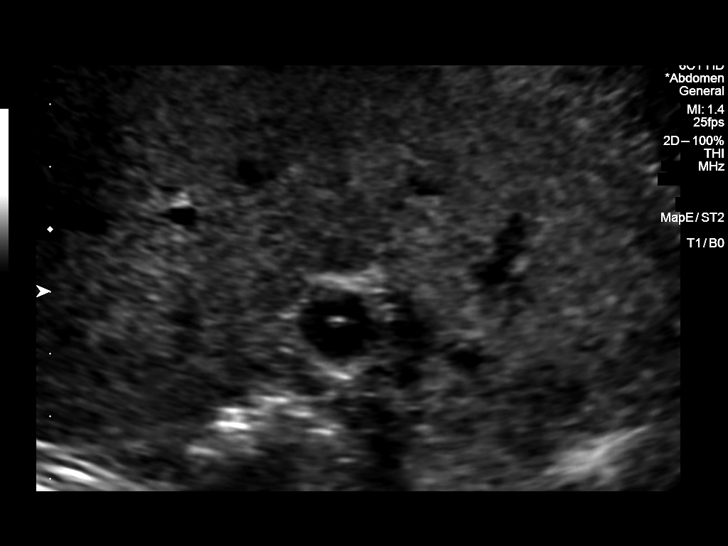
[im 16/41]
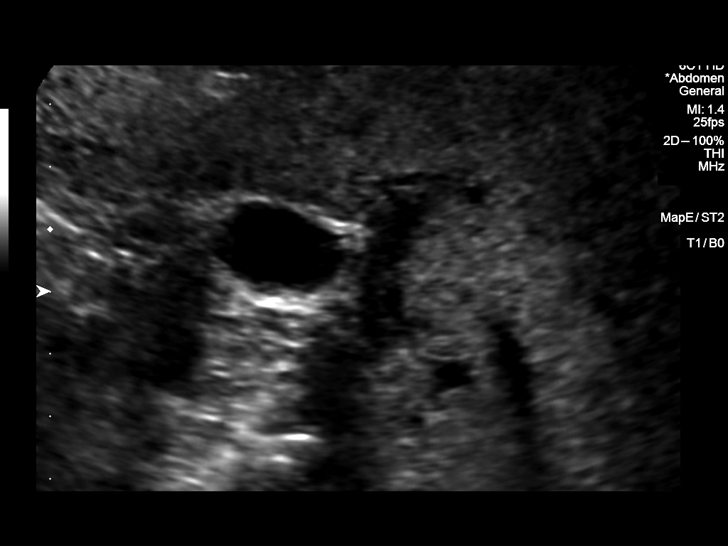
[im 19/41]
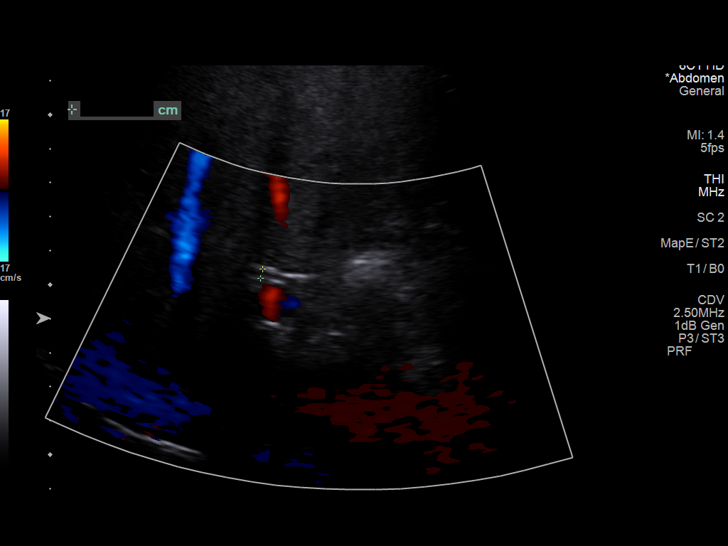
[im 22/41]
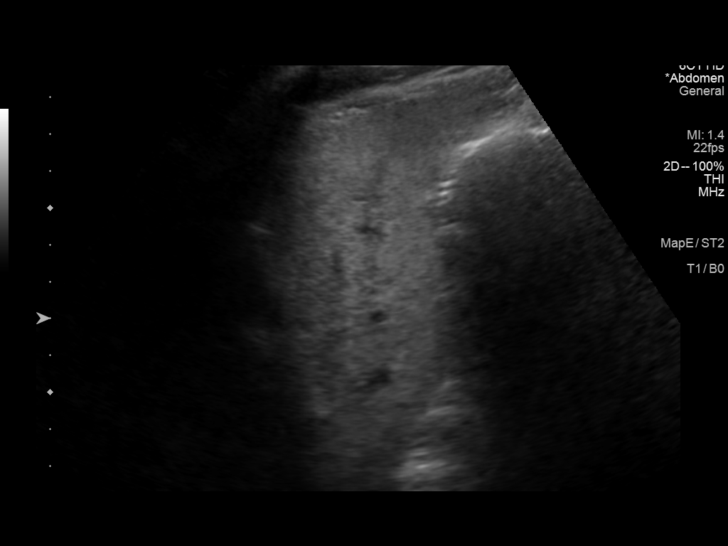
[im 26/41]
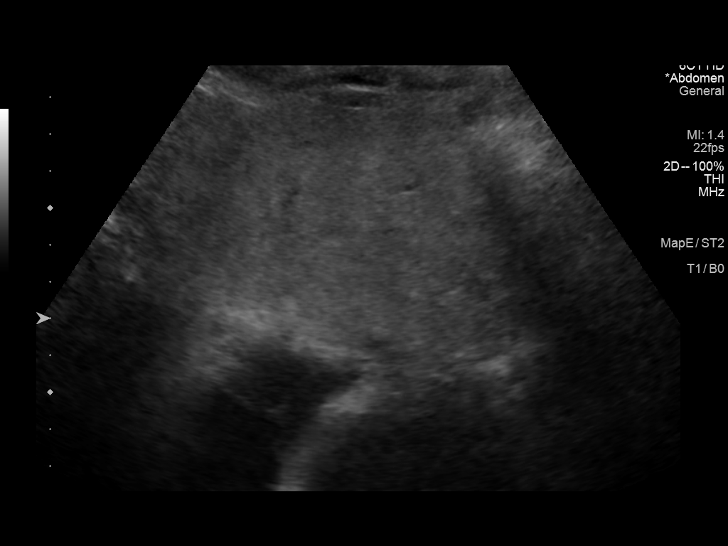
[im 27/41]
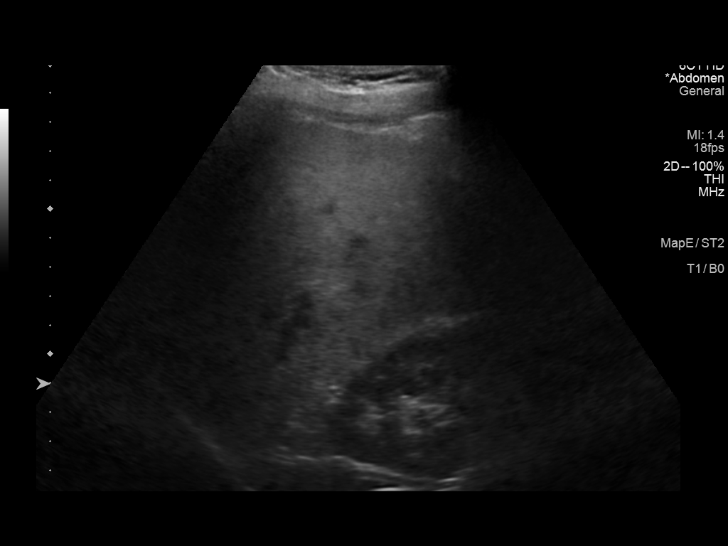
[im 31/41]
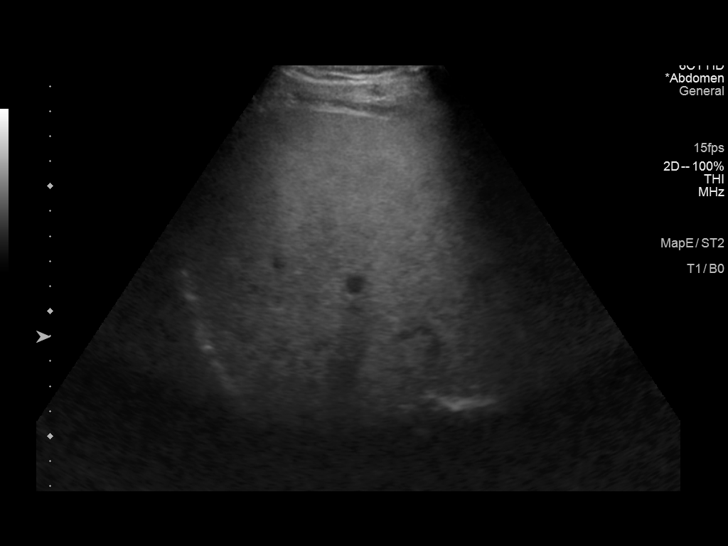
[im 34/41]
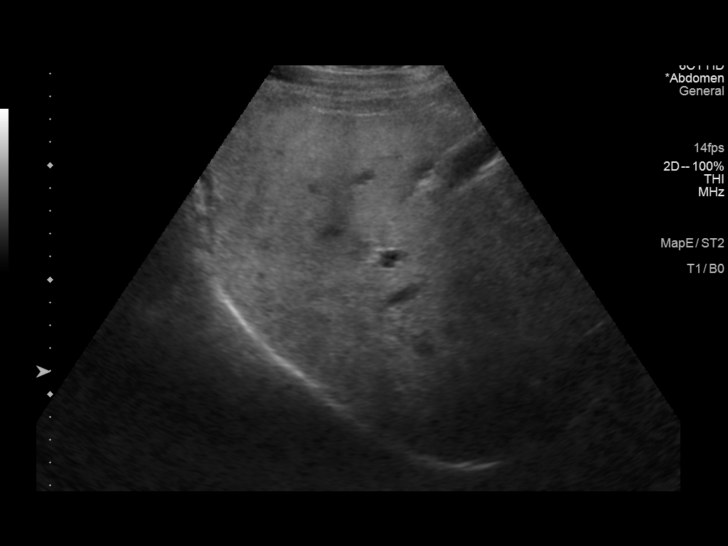
[im 37/41]
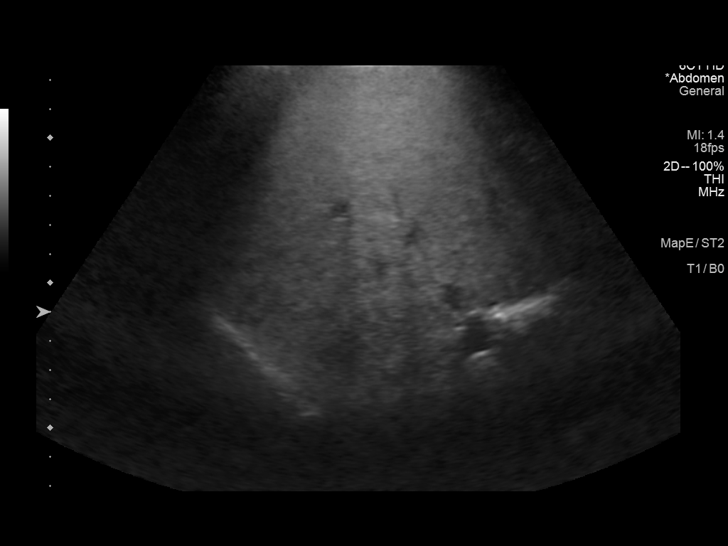
[im 41/41]
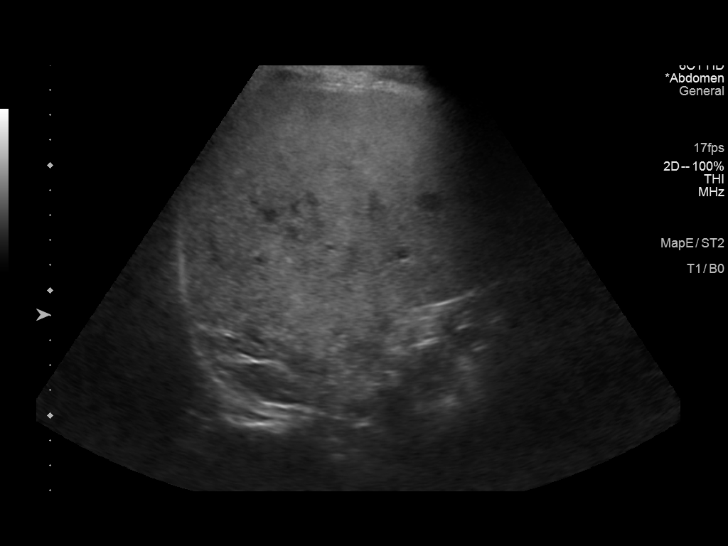

[14 of 25 positions shown; findings below may reference images not displayed]

FINDINGS: Gallbladder:

There is a 3 mm nonmobile echogenic focus in the gallbladder which
may represent an adherent sludge ball or a polyp. No shadowing
stone. No gallbladder wall thickening or pericholecystic fluid.
Negative sonographic Murphy's sign.

Common bile duct:

Diameter: 3 mm

Liver:

There is diffuse increased liver echogenicity most commonly seen in
the setting of fatty infiltration. Superimposed inflammation or
fibrosis is not excluded. Clinical correlation is recommended.
Portal vein is patent on color Doppler imaging with normal direction
of blood flow towards the liver.

Other: None.
IMPRESSION: 1. A probable 3 mm gallbladder polyp. No shadowing stone or
sonographic findings of acute cholecystitis.
2. Fatty liver.

## 2020-09-13 ENCOUNTER — Ambulatory Visit: Payer: BC Managed Care – PPO | Admitting: Nurse Practitioner

## 2020-09-20 ENCOUNTER — Other Ambulatory Visit: Payer: Self-pay | Admitting: *Deleted

## 2020-09-20 MED ORDER — HYDROCODONE-ACETAMINOPHEN 5-325 MG PO TABS: 1.0000 | ORAL_TABLET | Freq: Four times a day (QID) | ORAL | 0 refills | Status: DC | PRN

## 2020-09-20 NOTE — Telephone Encounter (Signed)
Received call from patient.   Requested refill on Hydrocodone/APAP.  Ok to refill??  Last office visit 08/26/2020.  Last refill 08/17/2020.

## 2020-10-11 ENCOUNTER — Other Ambulatory Visit: Payer: Self-pay | Admitting: Family Medicine

## 2020-10-19 ENCOUNTER — Other Ambulatory Visit: Payer: Self-pay | Admitting: *Deleted

## 2020-10-19 MED ORDER — HYDROCODONE-ACETAMINOPHEN 5-325 MG PO TABS: 1.0000 | ORAL_TABLET | Freq: Four times a day (QID) | ORAL | 0 refills | Status: DC | PRN

## 2020-10-19 NOTE — Telephone Encounter (Signed)
Received call from patient.   Requested refill on Hydrocodone/APAP.   Ok to refill??  Last office visit 08/26/2020.  Last refill 09/20/2020.

## 2020-10-26 ENCOUNTER — Ambulatory Visit: Payer: BC Managed Care – PPO | Admitting: Nurse Practitioner

## 2020-11-15 ENCOUNTER — Other Ambulatory Visit: Payer: Self-pay | Admitting: Family Medicine

## 2020-11-15 DIAGNOSIS — R1011 Right upper quadrant pain: Secondary | ICD-10-CM

## 2020-11-15 DIAGNOSIS — K219 Gastro-esophageal reflux disease without esophagitis: Secondary | ICD-10-CM

## 2020-11-18 ENCOUNTER — Other Ambulatory Visit: Payer: Self-pay | Admitting: *Deleted

## 2020-11-18 MED ORDER — HYDROCODONE-ACETAMINOPHEN 5-325 MG PO TABS: 1.0000 | ORAL_TABLET | Freq: Four times a day (QID) | ORAL | 0 refills | Status: DC | PRN

## 2020-11-18 NOTE — Telephone Encounter (Signed)
Call placed to patient and patient made aware. Reports that he would like to stay and will discuss pain medication with NP.   Requested to have refilled today. Please advise.

## 2020-11-18 NOTE — Telephone Encounter (Signed)
Received call from patient.   Requested refill on Hydrocodone/APAP.   Ok to refill?? Last office visit 08/26/2020. Last refill 10/19/2020.  Also inquired as to if he is appropriate to be transferred to NP.

## 2020-11-18 NOTE — Telephone Encounter (Signed)
Okay to transfer to Shanda Bumps but it is up to her if she will prescribe the pain medication or send to pain clinic

## 2020-12-08 ENCOUNTER — Ambulatory Visit (INDEPENDENT_AMBULATORY_CARE_PROVIDER_SITE_OTHER): Payer: BC Managed Care – PPO | Admitting: Nurse Practitioner

## 2020-12-08 ENCOUNTER — Other Ambulatory Visit: Payer: Self-pay

## 2020-12-08 ENCOUNTER — Encounter: Payer: Self-pay | Admitting: Nurse Practitioner

## 2020-12-08 ENCOUNTER — Encounter: Payer: Self-pay | Admitting: *Deleted

## 2020-12-08 VITALS — BP 113/70 | HR 79 | Temp 96.6°F | Ht 71.0 in | Wt 188.4 lb

## 2020-12-08 DIAGNOSIS — R1011 Right upper quadrant pain: Secondary | ICD-10-CM

## 2020-12-08 DIAGNOSIS — R112 Nausea with vomiting, unspecified: Secondary | ICD-10-CM

## 2020-12-08 DIAGNOSIS — K219 Gastro-esophageal reflux disease without esophagitis: Secondary | ICD-10-CM

## 2020-12-08 NOTE — Progress Notes (Signed)
Referring Provider: Salley Scarlet, MD Primary Care Physician:  Valentino Nose, NP Primary GI:  Dr. Marletta Lor  Chief Complaint  Patient presents with  . Abdominal Pain    HPI:   Tyler Craig is a 50 y.o. male who presents for follow-up on GERD and abdominal pain.  Patient last seen in our office 06/09/2020 for video visit.  At that time noted history of GERD with previous history of aspirin powder use, negative H. pylori testing.  History of gallbladder polyp and shadowing stone, fatty liver.  At his last visit GERD symptoms doing well but still with persistent right upper quadrant discomfort.  Manages GERD with PPI.  Pain about twice a month that typically last 6 hours as sharp pain and and sore for a few days, then resolves.  No identified triggers.  Tried dietary changes without improvement.  This is essentially lifelong.  Gallbladder remains in situ.  Uses Ensure twice daily and regular dinner which is helped lose weight.  Some nausea and occasional vomiting with his pain.  Recommended HIDA scan, call for worsening, continue current medicines, follow-up in 3 months.  It does not appear HIDA scan was completed.  It appears his 64-month follow-up visit was canceled and rescheduled for today.  Today states doing okay overall. GERD is doing well on his PPI. He is still having intermittent RUQ pain and associated N/V. He hasn't had HIDA scan done. Denies other abdominal pain, hematochezia, melena, fever, chills, unintentional weight loss. Denies URI or flu-like symptoms. Denies loss of sense of taste or smell. The patient has not received COVID-19 vaccination(s). They are not interested in vaccine scheduling information. Denies chest pain, dyspnea, dizziness, lightheadedness, syncope, near syncope. Denies any other upper or lower GI symptoms.  PPI is Prilosec 40 mg daily  Past Medical History:  Diagnosis Date  . Anxiety    takes Fluoxetine daily  . Hyperlipidemia    takes  Atorvastatin daily  . Muscle spasm    takes Flexeril as needed  . Numbness    left arm and left leg.Tingling as well  . PONV (postoperative nausea and vomiting)     Past Surgical History:  Procedure Laterality Date  . ANTERIOR CERVICAL DECOMP/DISCECTOMY FUSION N/A 02/04/2017   Procedure: C4-5, C5-6 Anterior Cervical Discectomy and Fusion, Allograft and Plate at X7-9 and Zero P at C5-6;  Surgeon: Eldred Manges, MD;  Location[facility] OR;  Service: Orthopedics;  Laterality: N/A;  . EYE SURGERY     left eye artificial lens placed  . HERNIA REPAIR    . SPINE SURGERY     neck surgery- has plate    Current Outpatient Medications  Medication Sig Dispense Refill  . atorvastatin (LIPITOR) 80 MG tablet Take 80mg  once a day 90 tablet 1  . calcium carbonate (TUMS - DOSED IN MG ELEMENTAL CALCIUM) 500 MG chewable tablet Chew 3 tablets by mouth 2 (two) times daily as needed for indigestion or heartburn.    . celecoxib (CELEBREX) 200 MG capsule Take 200mg  once a day for inflammation 30 capsule 3  . cyclobenzaprine (FLEXERIL) 10 MG tablet Take 1 tablet (10 mg total) by mouth 3 (three) times daily as needed for muscle spasms. 30 tablet 1  . FLUoxetine (PROZAC) 40 MG capsule TAKE 1 CAPSULE BY MOUTH EVERY DAY 90 capsule 0  . HYDROcodone-acetaminophen (NORCO) 5-325 MG tablet Take 1 tablet by mouth every 6 (six) hours as needed for moderate pain. Dx: G89.4, G54.2 (Patient taking differently: Take  1 tablet by mouth as needed for moderate pain. Dx: G89.4, G54.2) 60 tablet 0  . omeprazole (PRILOSEC) 40 MG capsule TAKE 1 CAPSULE(40 MG) BY MOUTH DAILY FOR ABDOMINAL PAIN 30 capsule 3   No current facility-administered medications for this visit.    Allergies as of 12/08/2020 - Review Complete 12/08/2020  Allergen Reaction Noted  . No known allergies  02/01/2017    Family History  Problem Relation Age of Onset  . Hypertension Mother   . Colon cancer Father 13  . Heart disease Maternal Uncle 48       massive  MI  . Heart disease Maternal Grandfather     Social History   Socioeconomic History  . Marital status: Married    Spouse name: Not on file  . Number of children: Not on file  . Years of education: Not on file  . Highest education level: Not on file  Occupational History  . Not on file  Tobacco Use  . Smoking status: Current Every Day Smoker    Packs/day: 1.00    Years: 44.00    Pack years: 44.00    Types: Cigarettes  . Smokeless tobacco: Never Used  Vaping Use  . Vaping Use: Never used  Substance and Sexual Activity  . Alcohol use: Not Currently    Comment: occasionally  . Drug use: Not Currently  . Sexual activity: Yes  Other Topics Concern  . Not on file  Social History Narrative  . Not on file   Social Determinants of Health   Financial Resource Strain: Not on file  Food Insecurity: Not on file  Transportation Needs: Not on file  Physical Activity: Not on file  Stress: Not on file  Social Connections: Not on file    Subjective: Review of Systems  Constitutional: Negative for chills, fever, malaise/fatigue and weight loss.  HENT: Negative for congestion and sore throat.   Respiratory: Negative for cough and shortness of breath.   Cardiovascular: Negative for chest pain and palpitations.  Gastrointestinal: Positive for abdominal pain, nausea and vomiting. Negative for blood in stool, diarrhea, heartburn and melena.  Musculoskeletal: Negative for joint pain and myalgias.  Skin: Negative for rash.  Neurological: Negative for dizziness and weakness.  Endo/Heme/Allergies: Does not bruise/bleed easily.  Psychiatric/Behavioral: Negative for depression. The patient is not nervous/anxious.   All other systems reviewed and are negative.    Objective: BP 113/70   Pulse 79   Temp (!) 96.6 F (35.9 C) (Temporal)   Ht 5\' 11"  (1.803 m)   Wt 188 lb 6.4 oz (85.5 kg)   BMI 26.28 kg/m  Physical Exam Vitals and nursing note reviewed.  Constitutional:      General:  He is not in acute distress.    Appearance: Normal appearance. He is normal weight. He is not ill-appearing, toxic-appearing or diaphoretic.  HENT:     Head: Normocephalic and atraumatic.     Nose: No congestion or rhinorrhea.  Eyes:     General: No scleral icterus. Cardiovascular:     Rate and Rhythm: Normal rate and regular rhythm.     Heart sounds: Normal heart sounds.  Pulmonary:     Effort: Pulmonary effort is normal.     Breath sounds: Normal breath sounds.  Abdominal:     General: Bowel sounds are normal. There is no distension.     Palpations: Abdomen is soft. There is no hepatomegaly, splenomegaly or mass.     Tenderness: There is no abdominal tenderness. There is  no guarding or rebound.     Hernia: No hernia is present.  Musculoskeletal:     Cervical back: Neck supple.  Skin:    General: Skin is warm and dry.     Coloration: Skin is not jaundiced.     Findings: No bruising or rash.  Neurological:     General: No focal deficit present.     Mental Status: He is alert and oriented to person, place, and time. Mental status is at baseline.  Psychiatric:        Mood and Affect: Mood normal.        Behavior: Behavior normal.        Thought Content: Thought content normal.      Assessment:  Very pleasant 50 year old male presents to follow-up on GERD and right upper quadrant abdominal pain.  Previously recommended a HIDA scan due to normal right upper quadrant ultrasound.  No red flag/warning signs or symptoms.  GERD: Currently his GERD symptoms are well managed, he is requiring significantly less times.  Typically when he requires Tums for breakthrough is because "I am stupidity" eating something that he knows will cause a problem.  Recommend he continue his omeprazole 40 mg daily as it seems to be effective for him.  Right upper quadrant abdominal pain: I am suspicious for biliary etiology given his constellation of symptoms.  Additionally, it helps if we have improved his  GERD and he is not having any other abdominal symptoms such as constipation, diarrhea, IBS, etc.  I have again recommended he complete a HIDA scan, he is not sure why it was not done but he will listen out for the phone call to get scheduled.  Depending on HIDA results, or possibly even if a normal HIDA, we may need to refer him to surgery to consider a cholecystectomy.  Further recommendations will follow.   Plan: 1. Reschedule previously ordered HIDA scan 2. Continue current medications 3. Return for follow-up in 3 months     Thank you for allowing [place] to participate in the care of Adela Glimpse  Wynne Dust, DNP, AGNP-C Adult & Gerontological Nurse Practitioner [facility]   12/08/2020 2:28 PM   Disclaimer: This note was dictated with voice recognition software. Similar sounding words can inadvertently be transcribed and may not be corrected upon review.

## 2020-12-08 NOTE — Patient Instructions (Signed)
Your health issues we discussed today were:   GERD (reflux/heartburn): 1. I am glad you are doing better! 2. Continue taking omeprazole (Prilosec) 40 mg once a day 3. You can continue to use Tums as needed for breakthrough 4. Try to avoid trigger foods that tend to cause a problem/flare of your symptoms 5. Call us for any worsening or severe symptoms  Right upper abdominal pain: 1. I will have the staff notify radiology to call you to reschedule your test of your gallbladder function (HIDA scan) 2. Further recommendations will follow the results of your scan 3. As we discussed, may eventually need to be referred to a surgeon to consider gallbladder removal 4. Call us for any worsening or severe symptoms  Overall I recommend:  1. Continue other current medications 2. Return for follow-up in 3 months 3. Call us for any questions or concerns   ---------------------------------------------------------------  I am glad you have gotten your COVID-19 vaccination!  Even though you are fully vaccinated you should continue to follow CDC and state/local guidelines.  ---------------------------------------------------------------   At [facility] we value your feedback. You may receive a survey about your visit today. Please share your experience as we strive to create trusting relationships with our patients to provide genuine, compassionate, quality care.  We appreciate your understanding and patience as we review any laboratory studies, imaging, and other diagnostic tests that are ordered as we care for you. Our office policy is 5 business days for review of these results, and any emergent or urgent results are addressed in a timely manner for your best interest. If you do not hear from our office in 1 week, please contact us.   We also encourage the use of MyChart, which contains your medical information for your review as well. If you are not enrolled in this feature, an  access code is on this after visit summary for your convenience. Thank you for allowing [place] to be involved in your care.  It was great to see you today!  I hope you have a great spring!!

## 2020-12-15 ENCOUNTER — Other Ambulatory Visit: Payer: Self-pay | Admitting: Family Medicine

## 2020-12-19 ENCOUNTER — Encounter (HOSPITAL_COMMUNITY): Payer: BC Managed Care – PPO

## 2020-12-20 ENCOUNTER — Telehealth: Payer: Self-pay | Admitting: Nurse Practitioner

## 2020-12-20 NOTE — Telephone Encounter (Signed)
Patient requesting call back from provider; has questions about medications he's taking. Patient also having issue with area on knee that won't heal. Please advise at [phone]

## 2020-12-22 NOTE — Telephone Encounter (Signed)
Patient left voicemail to follow up on request for call back regarding medication management.  Also needs refill of pain medication.   Please advise at [phone]

## 2020-12-22 NOTE — Telephone Encounter (Signed)
Call placed to patient. No answer. No VM.  

## 2020-12-23 NOTE — Telephone Encounter (Signed)
Call placed patient.   States that he would like to wean off of Prozac. Advised that he will need OV to discuss medication changes. Appointment scheduled.   Also reports that he has a knee injury that will not heal. Advised that provider will evaluate at appointment on Monday.   Requested refill on Hydrocodone/PAP. Advised to discuss medication with provider.

## 2020-12-23 NOTE — Telephone Encounter (Signed)
Call placed to patient. No answer. No VM.  

## 2020-12-26 ENCOUNTER — Other Ambulatory Visit: Payer: Self-pay

## 2020-12-26 ENCOUNTER — Encounter: Payer: Self-pay | Admitting: Nurse Practitioner

## 2020-12-26 ENCOUNTER — Ambulatory Visit (INDEPENDENT_AMBULATORY_CARE_PROVIDER_SITE_OTHER): Payer: BC Managed Care – PPO | Admitting: Nurse Practitioner

## 2020-12-26 VITALS — BP 174/100 | HR 88 | Temp 98.2°F | Ht 71.0 in | Wt 186.7 lb

## 2020-12-26 DIAGNOSIS — F339 Major depressive disorder, recurrent, unspecified: Secondary | ICD-10-CM

## 2020-12-26 DIAGNOSIS — F411 Generalized anxiety disorder: Secondary | ICD-10-CM | POA: Diagnosis not present

## 2020-12-26 DIAGNOSIS — R21 Rash and other nonspecific skin eruption: Secondary | ICD-10-CM

## 2020-12-26 DIAGNOSIS — M5030 Other cervical disc degeneration, unspecified cervical region: Secondary | ICD-10-CM

## 2020-12-26 DIAGNOSIS — M5136 Other intervertebral disc degeneration, lumbar region: Secondary | ICD-10-CM

## 2020-12-26 MED ORDER — TRIAMCINOLONE ACETONIDE 0.1 % EX OINT: 1.0000 "application " | TOPICAL_OINTMENT | Freq: Two times a day (BID) | CUTANEOUS | 0 refills | Status: DC

## 2020-12-26 MED ORDER — FLUOXETINE HCL 20 MG PO TABS: 20.0000 mg | ORAL_TABLET | Freq: Every day | ORAL | 3 refills | Status: DC

## 2020-12-26 MED ORDER — HYDROCODONE-ACETAMINOPHEN 5-325 MG PO TABS
1.0000 | ORAL_TABLET | Freq: Four times a day (QID) | ORAL | 0 refills | Status: DC | PRN
Start: 2020-12-26 — End: 2021-01-25

## 2020-12-26 MED ORDER — CELECOXIB 200 MG PO CAPS
ORAL_CAPSULE | ORAL | 1 refills | Status: DC
Start: 2020-12-26 — End: 2021-08-03

## 2020-12-26 NOTE — Progress Notes (Signed)
Subjective:    Patient ID: Tyler Craig, male    DOB: Feb 08, 1971, 50 y.o.   MRN: [id number]  HPI: Tyler Craig is a 50 y.o. male presenting for knee pain and to follow-up on his depression and anxiety.  Chief Complaint  Patient presents with  . Pain    Pain in the right knee, has been giving px for 1 yr. Began as callus, has gotten worse. Has lot of cracked skin. Using otc skin cream. Also wants to talk about coming off of the prozac. Declined depression and anxiety screening   CHRONIC PAIN  Patient reports several areas of pain including his neck, right shoulder, and lower back.  He has met with neurosurgeons in the past who have recommended surgery, however he does not desire further surgeries at this time.  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  He reports he takes 2 of his pain pills at nighttime to help him sleep and also help with the pain.  He has been without this medication for the past 7 days. Present dose:  20   Morphine equivalents Pain control status: uncontrolled Duration: chronic Location: multiple areas - neck, back, shoulder Quality: constant, aching Current Pain Level: moderate to severe Previous Pain Level: moderate to severe Breakthrough pain: yes Benefit from narcotic medications: yes What Activities task can be accomplished with current medication?: working, sleeping, normal daily activities Interested in weaning off narcotics:no   Stool softners/OTC fiber: yes  Previous pain specialty evaluation: no Non-narcotic analgesic meds: yes - Celebrex, Flexeril Narcotic contract: no  DEPRESSION Is requesting to come off of the Prozac.  He reports getting very agitated with the pain he is in.  He states that his sleep "is not good."  He does not feel like he has the ability to feel his emotions while on the medication and reports he wants to return to the man he was prior.  He reports that his wife is also having concerns of his mood while on  the medication.  Patient declines completing PHQ-9 and GAD-7 today.  He has been on Prozac 40 mg and thinks it has overall done okay.  He is wondering if there is something else that he can take to help him feel emotions. Mood status: uncontrolled Satisfied with current treatment?: no Symptom severity: moderate  Duration of current treatment : chronic Side effects: no Medication compliance: excellent compliance Psychotherapy/counseling: no Previous psychiatric medications: prozac, bupropion,  Lorazepam,  Depressed mood: yes Anxious mood: yes Anhedonia: yes Significant weight loss or gain: no Insomnia: yes Fatigue: yes Feelings of worthlessness or guilt: yes Impaired concentration/indecisiveness: no Suicidal ideations: no, states "I could never do that to my family" Hopelessness: yes Crying spells: yes  SKIN LESION Duration: 1 year Location: right knee History of trauma in area: no Pain: no Quality: itching Severity: severe Itching: yes Redness: yes Swelling: no Oozing: no Pus: no Fevers: no Nausea/vomiting: no Status: worse Treatments attempted: OTC cream  Tetanus: no  ELEVATED BLOOD PRESSURE Duration: today BP at home: normally 125-130/65-80 when not in pain Medical treatment: none Associated signs/symptoms: no vision, changes, headache, dizziness, chest pain, or shortness of breath  Allergies  Allergen Reactions  . No Known Allergies     Outpatient Encounter Medications as of 12/26/2020  Medication Sig  . atorvastatin (LIPITOR) 80 MG tablet Take 63m once a day  . calcium carbonate (TUMS - DOSED IN MG ELEMENTAL CALCIUM) 500 MG chewable tablet  Chew 3 tablets by mouth 2 (two) times daily as needed for indigestion or heartburn.  . cyclobenzaprine (FLEXERIL) 10 MG tablet Take 1 tablet (10 mg total) by mouth 3 (three) times daily as needed for muscle spasms.  Marland Kitchen FLUoxetine (PROZAC) 20 MG tablet Take 1 tablet (20 mg total) by mouth daily.  Marland Kitchen omeprazole (PRILOSEC) 40  MG capsule TAKE 1 CAPSULE(40 MG) BY MOUTH DAILY FOR ABDOMINAL PAIN  . triamcinolone ointment (KENALOG) 0.1 % Apply 1 application topically 2 (two) times daily. Apply sparingly to affected areas.  Do not use for more than 2 weeks consistently, notify prescriber if this is not working after about 1 week.  . [DISCONTINUED] celecoxib (CELEBREX) 200 MG capsule Take 254m once a day for inflammation  . [DISCONTINUED] FLUoxetine (PROZAC) 40 MG capsule TAKE 1 CAPSULE BY MOUTH EVERY DAY  . [DISCONTINUED] HYDROcodone-acetaminophen (NORCO) 5-325 MG tablet Take 1 tablet by mouth every 6 (six) hours as needed for moderate pain. Dx: G89.4, G54.2 (Patient taking differently: Take 1 tablet by mouth as needed for moderate pain. Dx: G89.4, G54.2)  . celecoxib (CELEBREX) 200 MG capsule Take 2082monce a day for inflammation  . HYDROcodone-acetaminophen (NORCO) 5-325 MG tablet Take 1 tablet by mouth every 6 (six) hours as needed for moderate pain. Dx: G89.4, G54.2   No facility-administered encounter medications on file as of 12/26/2020.    Patient Active Problem List   Diagnosis Date Noted  . Rash 12/27/2020  . Nausea with vomiting 06/09/2020  . GERD (gastroesophageal reflux disease) 09/30/2019  . Abdominal pain 09/30/2019  . 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 S/P cervical spinal fusion 04/04/2017  . Cervical spinal stenosis 02/04/2017  . DDD (degenerative disc disease), cervical 12/12/2016  . DDD (degenerative disc disease), lumbar 12/12/2016  . 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 Benign paroxysmal positional vertigo 08/03/2016  . Hyperlipidemia 11/16/2014  . Insomnia 07/05/2014  . Elbow pain 02/09/2014  . MDD (major depressive disorder) 12/29/2013  . GAD (generalized anxiety disorder) 12/29/2013  . Smoker 12/29/2013    Past Medical History:  Diagnosis Date  . Anxiety    takes Fluoxetine daily  . Hyperlipidemia    takes Atorvastatin daily  . Muscle spasm    takes Flexeril as  needed  . Numbness    left arm and left leg.Tingling as well  . PONV (postoperative nausea and vomiting)     Relevant past medical, surgical, family and social history reviewed and updated as indicated. Interim medical history since our last visit reviewed.  Review of Systems Per HPI unless specifically indicated above     Objective:    BP (!) 174/100   Pulse 88   Temp 98.2 F (36.8 C)   Ht _0  (1.803 m)   Wt 186 lb 11.2 oz (84.7 kg)   SpO2 97%   BMI 26.04 kg/m   Wt Readings from Last 3 Encounters:  12/26/20 186 lb 11.2 oz (84.7 kg)  12/08/20 188 lb 6.4 oz (85.5 kg)  08/26/20 184 lb (83.5 kg)    Physical Exam Vitals and nursing note reviewed.  Constitutional:      General: He is not in acute distress.    Appearance: Normal appearance. He is not toxic-appearing.  HENT:     Head: Normocephalic and atraumatic.  Eyes:     General: No scleral icterus.    Extraocular Movements: Extraocular movements intact.  Neck:     Vascular: No carotid bruit.  Cardiovascular:     Rate and  Rhythm: Normal rate and regular rhythm.     Heart sounds: Normal heart sounds. No murmur heard.   Pulmonary:     Effort: Pulmonary effort is normal. No respiratory distress.     Breath sounds: Normal breath sounds. No wheezing, rhonchi or rales.  Abdominal:     General: Abdomen is flat. Bowel sounds are normal.     Palpations: Abdomen is soft.  Musculoskeletal:     Cervical back: Neck supple.     Right lower leg: No edema.     Left lower leg: No edema.       Legs:     Comments: Patient favoring right shoulder during exam  Lymphadenopathy:     Cervical: No cervical adenopathy.  Skin:    General: Skin is warm and dry.     Capillary Refill: Capillary refill takes less than 2 seconds.     Coloration: Skin is not jaundiced or pale.     Findings: No erythema.  Neurological:     Mental Status: He is alert and oriented to person, place, and time.  Psychiatric:        Mood and Affect: Mood  normal.        Behavior: Behavior normal.        Thought Content: Thought content normal.        Judgment: Judgment normal.       Assessment & Plan:   Problem List Items Addressed This Visit      Musculoskeletal and Integument   Rash    Acute.  Question eczema versus other contact dermatitis that is exacerbated.  Will start triamcinolone 0.1% ointment.  Not to use for more than 2 weeks consecutively to prevent scarring.  If symptoms persist despite daily use, recommended follow-up with [place] to increase his steroid cream.      Relevant Medications   triamcinolone ointment (KENALOG) 0.1 %   DDD (degenerative disc disease), lumbar    Chronic.  Patient has been taking 2 Norco 5-325 at bedtime to help with pain and to allow him to be able to fall asleep.  He is also taking Celebrex 200 mg daily.  We discussed at length that I am not comfortable prescribing narcotics for daily use and discussed pain clinic.  He is agreeable to go and for a pain management specialist to prescribe and manage his pain medication.  I am hopeful that they may be able to offer the patient therapies that would help improve his pain and also his mood significantly.  I reviewed his PDMP and refilled his medication for now until he gets in with a pain management specialist.  Will place referral to pain management.      Relevant Medications   HYDROcodone-acetaminophen (NORCO) 5-325 MG tablet   celecoxib (CELEBREX) 200 MG capsule   Other Relevant Orders   Ambulatory referral to Pain Clinic   DDD (degenerative disc disease), cervical    Chronic.  Patient has been taking 2 Norco 5-325 at bedtime to help with pain and to allow him to be able to fall asleep.  He is also taking Celebrex 200 mg daily.  We discussed at length that I am not comfortable prescribing narcotics for daily use and discussed pain clinic.  He is agreeable to go and for a pain management specialist to prescribe and manage his pain medication.  I am hopeful  that they may be able to offer the patient therapies that would help improve his pain and also his mood significantly.  I  reviewed his PDMP and refilled his medication for now until he gets in with a pain management specialist.  Will place referral to pain management.      Relevant Medications   HYDROcodone-acetaminophen (NORCO) 5-325 MG tablet   celecoxib (CELEBREX) 200 MG capsule   Other Relevant Orders   Ambulatory referral to Pain Clinic     Other   MDD (major depressive disorder) - Primary    Chronic.  Declined completing PHQ-9 and GAD-7 today.  It sounds like depression is uncontrolled at this time and we discussed at length that I do not recommend coming off of the Prozac right now.  For now, we plan to decrease Prozac to 20 mg and see how this does.  If this is not helping with his mood, we can consider switching to another SSRI or SNRI.  Follow up in 1 month.      Relevant Medications   FLUoxetine (PROZAC) 20 MG tablet   GAD (generalized anxiety disorder)    Chronic.  Declined completing PHQ-9 and GAD-7 today.  It sounds like depression is uncontrolled at this time and we discussed at length that I do not recommend coming off of the Prozac right now.  For now, we plan to decrease Prozac to 20 mg and see how this does.  If this is not helping with his mood, we can consider switching to another SSRI or SNRI.  Follow up in 1 month.      Relevant Medications   FLUoxetine (PROZAC) 20 MG tablet       Follow up plan: Return in about 4 weeks (around 01/23/2021) for mood f/u.

## 2020-12-27 ENCOUNTER — Encounter: Payer: Self-pay | Admitting: Internal Medicine

## 2020-12-27 DIAGNOSIS — R21 Rash and other nonspecific skin eruption: Secondary | ICD-10-CM | POA: Insufficient documentation

## 2020-12-27 NOTE — Assessment & Plan Note (Signed)
Chronic.  Patient has been taking 2 Norco 5-325 at bedtime to help with pain and to allow him to be able to fall asleep.  He is also taking Celebrex 200 mg daily.  We discussed at length that I am not comfortable prescribing narcotics for daily use and discussed pain clinic.  He is agreeable to go and for a pain management specialist to prescribe and manage his pain medication.  I am hopeful that they may be able to offer the patient therapies that would help improve his pain and also his mood significantly.  I reviewed his PDMP and refilled his medication for now until he gets in with a pain management specialist.  Will place referral to pain management.

## 2020-12-27 NOTE — Assessment & Plan Note (Signed)
Chronic.  Patient has been taking 2 Norco 5-325 at bedtime to help with pain and to allow him to be able to fall asleep.  He is also taking Celebrex 200 mg daily.  We discussed at length that I am not comfortable prescribing narcotics for daily use and discussed pain clinic.  He is agreeable to go and for a pain management specialist to prescribe and manage his pain medication.  I am hopeful that they may be able to offer the patient therapies that would help improve his pain and also his mood significantly.  I reviewed his PDMP and refilled his medication for now until he gets in with a pain management specialist.  Will place referral to pain management. 

## 2020-12-27 NOTE — Assessment & Plan Note (Addendum)
Chronic.  Declined completing PHQ-9 and GAD-7 today.  It sounds like depression is uncontrolled at this time and we discussed at length that I do not recommend coming off of the Prozac right now.  For now, we plan to decrease Prozac to 20 mg and see how this does.  If this is not helping with his mood, we can consider switching to another SSRI or SNRI.  Follow up in 1 month. 

## 2020-12-27 NOTE — Assessment & Plan Note (Addendum)
Chronic.  Declined completing PHQ-9 and GAD-7 today.  It sounds like depression is uncontrolled at this time and we discussed at length that I do not recommend coming off of the Prozac right now.  For now, we plan to decrease Prozac to 20 mg and see how this does.  If this is not helping with his mood, we can consider switching to another SSRI or SNRI.  Follow up in 1 month.

## 2020-12-27 NOTE — Assessment & Plan Note (Signed)
Acute.  Question eczema versus other contact dermatitis that is exacerbated.  Will start triamcinolone 0.1% ointment.  Not to use for more than 2 weeks consecutively to prevent scarring.  If symptoms persist despite daily use, recommended follow-up with [place] to increase his steroid cream.

## 2021-01-09 ENCOUNTER — Other Ambulatory Visit: Payer: Self-pay | Admitting: Family Medicine

## 2021-01-09 DIAGNOSIS — F411 Generalized anxiety disorder: Secondary | ICD-10-CM

## 2021-01-09 DIAGNOSIS — F339 Major depressive disorder, recurrent, unspecified: Secondary | ICD-10-CM

## 2021-01-10 MED ORDER — FLUOXETINE HCL 20 MG PO TABS: 20.0000 mg | ORAL_TABLET | Freq: Every day | ORAL | 3 refills | Status: DC

## 2021-01-25 ENCOUNTER — Encounter: Payer: Self-pay | Admitting: Nurse Practitioner

## 2021-01-25 ENCOUNTER — Ambulatory Visit: Payer: BC Managed Care – PPO | Admitting: Nurse Practitioner

## 2021-01-25 ENCOUNTER — Other Ambulatory Visit: Payer: Self-pay

## 2021-01-25 VITALS — BP 120/78 | HR 84 | Temp 98.2°F | Ht 71.0 in | Wt 188.6 lb

## 2021-01-25 DIAGNOSIS — F339 Major depressive disorder, recurrent, unspecified: Secondary | ICD-10-CM

## 2021-01-25 DIAGNOSIS — F411 Generalized anxiety disorder: Secondary | ICD-10-CM | POA: Diagnosis not present

## 2021-01-25 DIAGNOSIS — R7303 Prediabetes: Secondary | ICD-10-CM | POA: Diagnosis not present

## 2021-01-25 DIAGNOSIS — E782 Mixed hyperlipidemia: Secondary | ICD-10-CM | POA: Diagnosis not present

## 2021-01-25 DIAGNOSIS — M5030 Other cervical disc degeneration, unspecified cervical region: Secondary | ICD-10-CM

## 2021-01-25 DIAGNOSIS — M5136 Other intervertebral disc degeneration, lumbar region: Secondary | ICD-10-CM

## 2021-01-25 MED ORDER — HYDROCODONE-ACETAMINOPHEN 5-325 MG PO TABS: 1.0000 | ORAL_TABLET | Freq: Four times a day (QID) | ORAL | 0 refills | Status: DC | PRN

## 2021-01-25 MED ORDER — DULOXETINE HCL 20 MG PO CPEP: 20.0000 mg | ORAL_CAPSULE | Freq: Every day | ORAL | 0 refills | Status: DC

## 2021-01-25 NOTE — Progress Notes (Signed)
Subjective:    Patient ID: Tyler Craig, male    DOB: 1971-01-15, 50 y.o.   MRN: [id number]  HPI: Tyler Craig is a 50 y.o. male presenting for follow up.  Chief Complaint  Patient presents with  . Depression    Can't tell much of a difference on the medication   DEPRESSION Does not tell much of a difference decreasing on the fluoxetine.  Thinks most of aggravation comes from being in pain.  Thinks pain puts him in bad mood.  Reports his wife feels he is withdrawn.  Mood status: stable Satisfied with current treatment?: no Symptom severity: moderate  Duration of current treatment : chronic Side effects: no Medication compliance: excellent compliance Psychotherapy/counseling: no Depressed mood: yes Anxious mood: yes Anhedonia: yes Significant weight loss or gain: no Insomnia: yes Fatigue: yes Feelings of worthlessness or guilt: yes Impaired concentration/indecisiveness: yes Suicidal ideations: no Hopelessness: yes Crying spells: no Depression screen [facility] 2/9 01/25/2021 08/26/2020 04/20/2020 07/13/2019 04/21/2018  Decreased Interest 3 3 2 2 3   Down, Depressed, Hopeless 3 3 2 2 3   PHQ - 2 Score 6 6 4 4 6   Altered sleeping 3 2 2 2 3   Tired, decreased energy 3 2 2 2 3   Change in appetite 3 1 2 2 3   Feeling bad or failure about yourself  2 0 1 2 2   Trouble concentrating 3 2 2  0 3  Moving slowly or fidgety/restless 0 0 1 0 1  Suicidal thoughts 0 0 0 0 0  PHQ-9 Score 20 13 14 12 21   Difficult doing work/chores Very difficult - Very difficult Somewhat difficult Extremely dIfficult  Some recent data might be hidden    GAD 7 : Generalized Anxiety Score 01/25/2021  Nervous, Anxious, on Edge 3  Control/stop worrying 3  Worry too much - different things 2  Trouble relaxing 3  Restless 3  Easily annoyed or irritable 3  Afraid - awful might happen 0  Total GAD 7 Score 17  Anxiety Difficulty Very difficult    CHRONIC PAIN  Present dose:  10     Morphine equivalents  daily Pain control status: stable Duration: chronic Location: Neck, right shoulder, low back Quality: Constant, aching Current Pain Level: Moderate to severe Previous Pain Level: Moderate to severe Breakthrough pain: yes Benefit from narcotic medications: yes What Activities task can be accomplished with current medication?:  Holding down a job as a , sleeping, normally daily activities Interested in weaning off narcotics:no   Stool softners/OTC fiber: yes  Previous pain specialty evaluation: no Non-narcotic analgesic meds: Yes, Celebrex, Flexeril Narcotic contract: no  PRE-DIABETES Blood work in December 2021 showed prediabetes.  Patient is not currently checking sugar really try to watch his sugar intake.  He drinks at least 2 [facility] per day. Hypoglycemic episodes:no Polydipsia/polyuria: no Visual disturbance: no Chest pain: no Paresthesias: no Diabetic Education: Completed  HYPERLIPIDEMIA LDL cholesterol in December 2021 was greater than 120.  Patient was increased to atorvastatin 80 mg daily at this time.  He reports no issues with this medication. Hyperlipidemia status: excellent compliance Satisfied with current treatment?  yes Side effects:  no Medication compliance: excellent compliance The 10-year ASCVD risk score DC Jr., et al., 2013) is: 7.3%   Values used to calculate the score:     Age: 68 years     Sex: Male     Is Non-Hispanic African American: No     Diabetic: No  Tobacco smoker: Yes     Systolic Blood Pressure: 120 mmHg     Is BP treated: No     HDL Cholesterol: 35 mg/dL     Total Cholesterol: 168 mg/dL Chest pain:  no  Allergies  Allergen Reactions  . No Known Allergies     Outpatient Encounter Medications as of 01/25/2021  Medication Sig  . atorvastatin (LIPITOR) 80 MG tablet Take 80mg  once a day  . calcium carbonate (TUMS - DOSED IN MG ELEMENTAL CALCIUM) 500 MG chewable tablet Chew 3 tablets by mouth 2 (two) times daily as  needed for indigestion or heartburn.  . celecoxib (CELEBREX) 200 MG capsule Take 200mg  once a day for inflammation  . cyclobenzaprine (FLEXERIL) 10 MG tablet Take 1 tablet (10 mg total) by mouth 3 (three) times daily as needed for muscle spasms.  . DULoxetine (CYMBALTA) 20 MG capsule Take 1 capsule (20 mg total) by mouth daily.  Marland Kitchen. omeprazole (PRILOSEC) 40 MG capsule TAKE 1 CAPSULE(40 MG) BY MOUTH DAILY FOR ABDOMINAL PAIN  . triamcinolone ointment (KENALOG) 0.1 % Apply 1 application topically 2 (two) times daily. Apply sparingly to affected areas.  Do not use for more than 2 weeks consistently, notify prescriber if this is not working after about 1 week.  . [DISCONTINUED] FLUoxetine (PROZAC) 20 MG tablet Take 1 tablet (20 mg total) by mouth daily.  . [DISCONTINUED] HYDROcodone-acetaminophen (NORCO) 5-325 MG tablet Take 1 tablet by mouth every 6 (six) hours as needed for moderate pain. Dx: G89.4, G54.2  . HYDROcodone-acetaminophen (NORCO) 5-325 MG tablet Take 1 tablet by mouth every 6 (six) hours as needed for moderate pain or severe pain. Dx: G89.4, G54.2   No facility-administered encounter medications on file as of 01/25/2021.    Patient Active Problem List   Diagnosis Date Noted  . Prediabetes 01/26/2021  . Rash 12/27/2020  . Nausea with vomiting 06/09/2020  . GERD (gastroesophageal reflux disease) 09/30/2019  . Abdominal pain 09/30/2019  . 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 S/P cervical spinal fusion 04/04/2017  . Cervical spinal stenosis 02/04/2017  . DDD (degenerative disc disease), cervical 12/12/2016  . DDD (degenerative disc disease), lumbar 12/12/2016  . 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 Benign paroxysmal positional vertigo 08/03/2016  . Hyperlipidemia 11/16/2014  . Insomnia 07/05/2014  . Elbow pain 02/09/2014  . MDD (major depressive disorder) 12/29/2013  . GAD (generalized anxiety disorder) 12/29/2013  . Smoker 12/29/2013    Past Medical History:   Diagnosis Date  . Anxiety    takes Fluoxetine daily  . Hyperlipidemia    takes Atorvastatin daily  . Muscle spasm    takes Flexeril as needed  . Numbness    left arm and left leg.Tingling as well  . PONV (postoperative nausea and vomiting)     Relevant past medical, surgical, family and social history reviewed and updated as indicated. Interim medical history since our last visit reviewed.  Review of Systems Per HPI unless specifically indicated above     Objective:    BP 120/78   Pulse 84   Temp 98.2 F (36.8 C)   Ht 5\' 11"  (1.803 m)   Wt 188 lb 9.6 oz (85.5 kg)   SpO2 97%   BMI 26.30 kg/m   Wt Readings from Last 3 Encounters:  01/25/21 188 lb 9.6 oz (85.5 kg)  12/26/20 186 lb 11.2 oz (84.7 kg)  12/08/20 188 lb 6.4 oz (85.5 kg)    Physical Exam     Assessment &  Plan:   Problem List Items Addressed This Visit      Musculoskeletal and Integument   DDD (degenerative disc disease), lumbar    Chronic.  Patient maintains on 2 Norco 5-325 at bedtime type of pain and allow him to be able to lay down to fall asleep.  Continues on Celebrex 200 mg daily as well.  Referral to pain clinic is pending-appointment is next month.  PDMP reviewed and appropriate-refill given today.  I am hoping that switching from Prozac to duloxetine will offer some pain relief.  Patient is aware that I will not manage narcotic medication long-term.  Follow-up in 1 month.      Relevant Medications   HYDROcodone-acetaminophen (NORCO) 5-325 MG tablet   DDD (degenerative disc disease), cervical    Chronic.  Patient maintains on 2 Norco 5-325 at bedtime type of pain and allow him to be able to lay down to fall asleep.  Continues on Celebrex 200 mg daily as well.  Referral to pain clinic is pending-appointment is next month.  PDMP reviewed and appropriate-refill given today.  I am hoping that switching from Prozac to duloxetine will offer some pain relief.  Patient is aware that I will not manage  narcotic medication long-term.  Follow-up in 1 month.      Relevant Medications   HYDROcodone-acetaminophen (NORCO) 5-325 MG tablet     Other   Prediabetes    Last A1c in December 2021 was 5.8%.  We will recheck today.  Patient is not having any symptoms, however is also not watching his diet.  Encouraged cutting back on [facility].  Follow-up in 6 months.      Relevant Orders   Hemoglobin A1c (Completed)   BASIC METABOLIC PANEL WITH GFR (Completed)   MDD (major depressive disorder) - Primary    Chronic.  PHQ-9 and GAD-7 both elevated today.  No SI/HI.  Given Prozac is still not offering much for the patient, will switch to duloxetine in hopes that this will help mood and also give moderate pain relief.  Discussed keeping Prozac at current dosing while starting duloxetine for 7 days, then stop Prozac and continue duloxetine.  Follow-up in 1 month, will likely need to increase duloxetine dose to achieve maximum effectiveness.      Relevant Medications   DULoxetine (CYMBALTA) 20 MG capsule   Other Relevant Orders   BASIC METABOLIC PANEL WITH GFR (Completed)   Hyperlipidemia    Chronic.  Recently increase atorvastatin to 80 mg daily.  We will recheck lipid levels today, although patient is not fasting.  If lipids remain elevated, consider repeating in 3 months while patient is fasting.  Follow-up pending lab work.      Relevant Orders   Lipid Panel (Completed)   GAD (generalized anxiety disorder)    Chronic.  PHQ-9 and GAD-7 both elevated today.  No SI/HI.  Given Prozac is still not offering much for the patient, will switch to duloxetine in hopes that this will help mood and also give moderate pain relief.  Discussed keeping Prozac at current dosing while starting duloxetine for 7 days, then stop Prozac and continue duloxetine.  Follow-up in 1 month, will likely need to increase duloxetine dose to achieve maximum effectiveness.      Relevant Medications   DULoxetine (CYMBALTA) 20 MG  capsule   Other Relevant Orders   BASIC METABOLIC PANEL WITH GFR (Completed)       Follow up plan: Return in about 4 weeks (around 02/22/2021) for Mood,  pain follow-up.

## 2021-01-26 DIAGNOSIS — R7303 Prediabetes: Secondary | ICD-10-CM | POA: Insufficient documentation

## 2021-01-26 LAB — BASIC METABOLIC PANEL WITH GFR
BUN: 14 mg/dL (ref 7–25)
CO2: 26 mmol/L (ref 20–32)
Calcium: 9.5 mg/dL (ref 8.6–10.3)
Chloride: 106 mmol/L (ref 98–110)
Creat: 0.91 mg/dL (ref 0.60–1.35)
GFR, Est African American: 114 mL/min/{1.73_m2} (ref >=60)
GFR, Est Non African American: 99 mL/min/{1.73_m2} (ref >=60)
Glucose, Bld: 79 mg/dL (ref 65–99)
Potassium: 4.6 mmol/L (ref 3.5–5.3)
Sodium: 141 mmol/L (ref 135–146)

## 2021-01-26 LAB — LIPID PANEL
Cholesterol: 168 mg/dL (ref <200)
HDL: 35 mg/dL — ABNORMAL LOW (ref >=40)
LDL Cholesterol (Calc): 100 mg/dL (calc) — ABNORMAL HIGH
Non-HDL Cholesterol (Calc): 133 mg/dL (calc) — ABNORMAL HIGH (ref <130)
Total CHOL/HDL Ratio: 4.8 (calc) (ref <5.0)
Triglycerides: 214 mg/dL — ABNORMAL HIGH (ref <150)

## 2021-01-26 LAB — HEMOGLOBIN A1C
Hgb A1c MFr Bld: 5.7 % of total Hgb — ABNORMAL HIGH (ref <5.7)
Mean Plasma Glucose: 117 mg/dL
eAG (mmol/L): 6.5 mmol/L

## 2021-01-26 NOTE — Assessment & Plan Note (Signed)
Chronic.  Recently increase atorvastatin to 80 mg daily.  We will recheck lipid levels today, although patient is not fasting.  If lipids remain elevated, consider repeating in 3 months while patient is fasting.  Follow-up pending lab work.

## 2021-01-26 NOTE — Assessment & Plan Note (Signed)
Chronic.  PHQ-9 and GAD-7 both elevated today.  No SI/HI.  Given Prozac is still not offering much for the patient, will switch to duloxetine in hopes that this will help mood and also give moderate pain relief.  Discussed keeping Prozac at current dosing while starting duloxetine for 7 days, then stop Prozac and continue duloxetine.  Follow-up in 1 month, will likely need to increase duloxetine dose to achieve maximum effectiveness. 

## 2021-01-26 NOTE — Assessment & Plan Note (Addendum)
Chronic.  PHQ-9 and GAD-7 both elevated today.  No SI/HI.  Given Prozac is still not offering much for the patient, will switch to duloxetine in hopes that this will help mood and also give moderate pain relief.  Discussed keeping Prozac at current dosing while starting duloxetine for 7 days, then stop Prozac and continue duloxetine.  Follow-up in 1 month, will likely need to increase duloxetine dose to achieve maximum effectiveness.

## 2021-01-26 NOTE — Assessment & Plan Note (Signed)
Chronic.  Patient maintains on 2 Norco 5-325 at bedtime type of pain and allow him to be able to lay down to fall asleep.  Continues on Celebrex 200 mg daily as well.  Referral to pain clinic is pending-appointment is next month.  PDMP reviewed and appropriate-refill given today.  I am hoping that switching from Prozac to duloxetine will offer some pain relief.  Patient is aware that I will not manage narcotic medication long-term.  Follow-up in 1 month.

## 2021-01-26 NOTE — Assessment & Plan Note (Signed)
Last A1c in December 2021 was 5.8%.  We will recheck today.  Patient is not having any symptoms, however is also not watching his diet.  Encouraged cutting back on [facility].  Follow-up in 6 months.

## 2021-01-26 NOTE — Assessment & Plan Note (Signed)
Chronic.  Patient maintains on 2 Norco 5-325 at bedtime type of pain and allow him to be able to lay down to fall asleep.  Continues on Celebrex 200 mg daily as well.  Referral to pain clinic is pending-appointment is next month.  PDMP reviewed and appropriate-refill given today.  I am hoping that switching from Prozac to duloxetine will offer some pain relief.  Patient is aware that I will not manage narcotic medication long-term.  Follow-up in 1 month. 

## 2021-02-23 ENCOUNTER — Other Ambulatory Visit: Payer: Self-pay

## 2021-02-23 DIAGNOSIS — M5136 Other intervertebral disc degeneration, lumbar region: Secondary | ICD-10-CM

## 2021-02-23 DIAGNOSIS — M5030 Other cervical disc degeneration, unspecified cervical region: Secondary | ICD-10-CM

## 2021-03-01 ENCOUNTER — Other Ambulatory Visit: Payer: Self-pay

## 2021-03-01 ENCOUNTER — Ambulatory Visit: Payer: BC Managed Care – PPO | Admitting: Nurse Practitioner

## 2021-03-01 ENCOUNTER — Encounter: Payer: Self-pay | Admitting: Nurse Practitioner

## 2021-03-01 DIAGNOSIS — M5136 Other intervertebral disc degeneration, lumbar region: Secondary | ICD-10-CM

## 2021-03-01 DIAGNOSIS — M5030 Other cervical disc degeneration, unspecified cervical region: Secondary | ICD-10-CM

## 2021-03-01 DIAGNOSIS — F411 Generalized anxiety disorder: Secondary | ICD-10-CM

## 2021-03-01 DIAGNOSIS — F339 Major depressive disorder, recurrent, unspecified: Secondary | ICD-10-CM

## 2021-03-01 MED ORDER — DULOXETINE HCL 40 MG PO CPEP: 40.0000 mg | ORAL_CAPSULE | Freq: Every day | ORAL | 1 refills | Status: DC

## 2021-03-01 MED ORDER — HYDROCODONE-ACETAMINOPHEN 5-325 MG PO TABS: 1.0000 | ORAL_TABLET | Freq: Four times a day (QID) | ORAL | 0 refills | Status: DC | PRN

## 2021-03-01 MED ORDER — DULOXETINE HCL 20 MG PO CPEP: 20.0000 mg | ORAL_CAPSULE | Freq: Every day | ORAL | 1 refills | Status: DC

## 2021-03-01 NOTE — Assessment & Plan Note (Signed)
Chronic.  PHQ-9 and GAD-7 remain elevated today, though slightly improved despite patient running out of medication over the weekend.  Will increase duloxetine to 40 mg daily and follow up in 1 month.

## 2021-03-01 NOTE — Assessment & Plan Note (Signed)
Chronic.  PHQ-9 and GAD-7 remain elevated today, though slightly improved despite patient running out of medication over the weekend.  Will increase duloxetine to 40 mg daily and follow up in 1 month.   

## 2021-03-01 NOTE — Assessment & Plan Note (Signed)
Chronic.  Continues on 2 Norco 5-325 at night time to help with pain and sleep.  Appointment to establish with pain management is coming up within the next month.  PDMP reviewed and appropriate - refill given today.  Follow up in 1 month. 

## 2021-03-01 NOTE — Progress Notes (Signed)
Subjective:    Patient ID: Tyler Craig, male    DOB: 02/20/1971, 50 y.o.   MRN: [id number]  HPI: Tyler Craig is a 50 y.o. male presenting for follow up.  Chief Complaint  Patient presents with   Anxiety    Follow up on cymbalta, asking for increase. RF on pain med   ANXIETY/STRESS Patient reports he has been out of duloxetine since Friday.  He reports he did not notice much of a difference being on that medication.  Is requesting medication be increased today. Duration: Chronic Anxious mood: yes  Excessive worrying: yes Irritability: yes  Sweating: no Nausea: no Palpitations:no Hyperventilation: no Panic attacks: no Agoraphobia: no  Obscessions/compulsions: no Depressed mood: yes Depression screen [facility] 2/9 03/01/2021 01/25/2021 08/26/2020 04/20/2020 07/13/2019  Decreased Interest 3 3 3 2 2   Down, Depressed, Hopeless 3 3 3 2 2   PHQ - 2 Score 6 6 6 4 4   Altered sleeping 3 3 2 2 2   Tired, decreased energy 3 3 2 2 2   Change in appetite 2 3 1 2 2   Feeling bad or failure about yourself  1 2 0 1 2  Trouble concentrating 3 3 2 2  0  Moving slowly or fidgety/restless 1 0 0 1 0  Suicidal thoughts 0 0 0 0 0  PHQ-9 Score 19 20 13 14 12   Difficult doing work/chores Somewhat difficult Very difficult - Very difficult Somewhat difficult  Some recent data might be hidden    GAD 7 : Generalized Anxiety Score 03/01/2021 01/25/2021  Nervous, Anxious, on Edge 3 3  Control/stop worrying 2 3  Worry too much - different things 2 2  Trouble relaxing 3 3  Restless 1 3  Easily annoyed or irritable 3 3  Afraid - awful might happen 0 0  Total GAD 7 Score 14 17  Anxiety Difficulty Somewhat difficult Very difficult    Anhedonia: yes Weight changes: no Insomnia: yes hard to fall asleep  Hypersomnia: no Fatigue/loss of energy: yes Feelings of worthlessness: yes Feelings of guilt: yes Impaired concentration/indecisiveness: yes Suicidal ideations: no  Crying spells: no Recent  Stressors/Life Changes: no   Relationship problems: no   Family stress: no     Financial stress: yes    Job stress: yes    Recent death/loss: no  Patient is also requesting refill of his pain medication-has an appointment with pain management later this month.  Reports he is also run out of this a few days ago.   Allergies  Allergen Reactions   No Known Allergies     Outpatient Encounter Medications as of 03/01/2021  Medication Sig   atorvastatin (LIPITOR) 80 MG tablet Take 80mg  once a day   calcium carbonate (TUMS - DOSED IN MG ELEMENTAL CALCIUM) 500 MG chewable tablet Chew 3 tablets by mouth 2 (two) times daily as needed for indigestion or heartburn.   celecoxib (CELEBREX) 200 MG capsule Take 200mg  once a day for inflammation   cyclobenzaprine (FLEXERIL) 10 MG tablet Take 1 tablet (10 mg total) by mouth 3 (three) times daily as needed for muscle spasms.   omeprazole (PRILOSEC) 40 MG capsule TAKE 1 CAPSULE(40 MG) BY MOUTH DAILY FOR ABDOMINAL PAIN   triamcinolone ointment (KENALOG) 0.1 % Apply 1 application topically 2 (two) times daily. Apply sparingly to affected areas.  Do not use for more than 2 weeks consistently, notify prescriber if this is not working after about 1 week.   [DISCONTINUED] DULoxetine (CYMBALTA) 20 MG capsule Take  1 capsule (20 mg total) by mouth daily.   [DISCONTINUED] HYDROcodone-acetaminophen (NORCO) 5-325 MG tablet Take 1 tablet by mouth every 6 (six) hours as needed for moderate pain or severe pain. Dx: G89.4, G54.2   DULoxetine 40 MG CPEP Take 40 mg by mouth daily.   HYDROcodone-acetaminophen (NORCO) 5-325 MG tablet Take 1 tablet by mouth every 6 (six) hours as needed for moderate pain or severe pain. Dx: G89.4, G54.2   [DISCONTINUED] DULoxetine (CYMBALTA) 20 MG capsule Take 1 capsule (20 mg total) by mouth daily.   No facility-administered encounter medications on file as of 03/01/2021.    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/2016   Benign paroxysmal positional vertigo 08/03/2016   Hyperlipidemia 11/16/2014   Insomnia 07/05/2014   Elbow pain 02/09/2014   MDD (major depressive disorder) 12/29/2013   GAD (generalized anxiety disorder) 12/29/2013   Smoker 12/29/2013    Past Medical History:  Diagnosis Date   Anxiety    takes Fluoxetine daily   Hyperlipidemia    takes Atorvastatin daily   Muscle spasm    takes Flexeril as needed   Numbness    left arm and left leg.Tingling as well   PONV (postoperative nausea and vomiting)     Relevant past medical, surgical, family and social history reviewed and updated as indicated. Interim medical history since our last visit reviewed.  Review of Systems Per HPI unless specifically indicated above     Objective:    BP 122/84   Pulse 70   Temp 98.5 F (36.9 C)   Ht 5\' 11"  (1.803 m)   Wt 190 lb (86.2 kg)   SpO2 100%   BMI 26.50 kg/m   Wt Readings from Last 3 Encounters:  03/01/21 190 lb (86.2 kg)  01/25/21 188 lb 9.6 oz (85.5 kg)  12/26/20 186 lb 11.2 oz (84.7 kg)    Physical Exam Vitals and nursing note reviewed.  Constitutional:      General: He is not in acute distress.    Appearance: Normal appearance. He is not toxic-appearing.  Musculoskeletal:        General: Normal range of motion.     Right lower leg: No edema.     Left lower leg: No edema.  Skin:    General: Skin is warm and dry.     Coloration: Skin is not jaundiced or pale.     Findings: No erythema.  Neurological:     Mental Status: He is alert and oriented to person,  place, and time.     Motor: No weakness.     Gait: Gait normal.  Psychiatric:        Mood and Affect: Mood normal.        Behavior: Behavior normal.        Thought Content: Thought content normal.        Judgment: Judgment normal.      Assessment & Plan:   Problem List Items Addressed This Visit       Musculoskeletal and Integument   DDD (degenerative disc disease), lumbar    Chronic.  Continues on 2 Norco 5-325 at night time to help with pain and sleep.  Appointment to establish with pain management is coming up within the next month.  PDMP reviewed and appropriate - refill given today.  Follow up in 1 month.       Relevant Medications   HYDROcodone-acetaminophen (NORCO) 5-325 MG tablet   DDD (degenerative disc disease), cervical    Chronic.  Continues on 2 Norco 5-325 at night time to help with pain and sleep.  Appointment to establish with pain management is coming up within the next month.  PDMP reviewed and appropriate - refill given today.  Follow up in 1 month.       Relevant Medications   HYDROcodone-acetaminophen (NORCO) 5-325 MG tablet     Other   MDD (major depressive disorder)    Chronic.  PHQ-9 and GAD-7 remain elevated today, though slightly improved despite patient running out of medication over the weekend.  Will increase duloxetine to 40 mg daily and follow up in 1 month.         Relevant Medications   DULoxetine 40 MG CPEP   GAD (generalized anxiety disorder)    Chronic.  PHQ-9 and GAD-7 remain elevated today, though slightly improved despite patient running out of medication over the weekend.  Will increase duloxetine to 40 mg daily and follow up in 1 month.         Relevant Medications   DULoxetine 40 MG CPEP     Follow up plan: Return in about 4 weeks (around 03/29/2021) for mood f/u.

## 2021-03-01 NOTE — Assessment & Plan Note (Signed)
Chronic.  Continues on 2 Norco 5-325 at night time to help with pain and sleep.  Appointment to establish with pain management is coming up within the next month.  PDMP reviewed and appropriate - refill given today.  Follow up in 1 month.

## 2021-03-07 ENCOUNTER — Ambulatory Visit: Payer: BC Managed Care – PPO | Admitting: Nurse Practitioner

## 2021-03-08 ENCOUNTER — Ambulatory Visit: Payer: BC Managed Care – PPO | Admitting: Gastroenterology

## 2021-03-08 ENCOUNTER — Encounter: Payer: Self-pay | Admitting: Internal Medicine

## 2021-03-15 ENCOUNTER — Encounter: Payer: Self-pay | Admitting: Pain Medicine

## 2021-03-15 ENCOUNTER — Ambulatory Visit: Payer: BC Managed Care – PPO | Attending: Pain Medicine | Admitting: Pain Medicine

## 2021-03-15 ENCOUNTER — Other Ambulatory Visit: Payer: Self-pay

## 2021-03-15 VITALS — BP 174/90 | HR 102 | Temp 97.0°F | Resp 16 | Ht 70.0 in | Wt 190.0 lb

## 2021-03-15 DIAGNOSIS — G894 Chronic pain syndrome: Secondary | ICD-10-CM | POA: Diagnosis not present

## 2021-03-15 DIAGNOSIS — Z9889 Other specified postprocedural states: Secondary | ICD-10-CM | POA: Diagnosis not present

## 2021-03-15 DIAGNOSIS — M47814 Spondylosis without myelopathy or radiculopathy, thoracic region: Secondary | ICD-10-CM | POA: Insufficient documentation

## 2021-03-15 DIAGNOSIS — M545 Low back pain, unspecified: Secondary | ICD-10-CM | POA: Diagnosis not present

## 2021-03-15 DIAGNOSIS — M899 Disorder of bone, unspecified: Secondary | ICD-10-CM | POA: Insufficient documentation

## 2021-03-15 DIAGNOSIS — Z79899 Other long term (current) drug therapy: Secondary | ICD-10-CM | POA: Insufficient documentation

## 2021-03-15 DIAGNOSIS — M25511 Pain in right shoulder: Secondary | ICD-10-CM

## 2021-03-15 DIAGNOSIS — M79601 Pain in right arm: Secondary | ICD-10-CM | POA: Diagnosis not present

## 2021-03-15 DIAGNOSIS — R202 Paresthesia of skin: Secondary | ICD-10-CM | POA: Insufficient documentation

## 2021-03-15 DIAGNOSIS — M5416 Radiculopathy, lumbar region: Secondary | ICD-10-CM | POA: Diagnosis present

## 2021-03-15 DIAGNOSIS — Z789 Other specified health status: Secondary | ICD-10-CM | POA: Diagnosis not present

## 2021-03-15 DIAGNOSIS — M79605 Pain in left leg: Secondary | ICD-10-CM | POA: Diagnosis not present

## 2021-03-15 DIAGNOSIS — M5136 Other intervertebral disc degeneration, lumbar region: Secondary | ICD-10-CM | POA: Insufficient documentation

## 2021-03-15 DIAGNOSIS — G8928 Other chronic postprocedural pain: Secondary | ICD-10-CM | POA: Diagnosis not present

## 2021-03-15 DIAGNOSIS — M79602 Pain in left arm: Secondary | ICD-10-CM | POA: Diagnosis not present

## 2021-03-15 DIAGNOSIS — M542 Cervicalgia: Secondary | ICD-10-CM | POA: Diagnosis not present

## 2021-03-15 DIAGNOSIS — G8929 Other chronic pain: Secondary | ICD-10-CM | POA: Insufficient documentation

## 2021-03-15 DIAGNOSIS — M47817 Spondylosis without myelopathy or radiculopathy, lumbosacral region: Secondary | ICD-10-CM | POA: Insufficient documentation

## 2021-03-15 DIAGNOSIS — M5412 Radiculopathy, cervical region: Secondary | ICD-10-CM | POA: Insufficient documentation

## 2021-03-15 DIAGNOSIS — R200 Anesthesia of skin: Secondary | ICD-10-CM | POA: Diagnosis not present

## 2021-03-15 DIAGNOSIS — M4804 Spinal stenosis, thoracic region: Secondary | ICD-10-CM | POA: Insufficient documentation

## 2021-03-15 DIAGNOSIS — M4807 Spinal stenosis, lumbosacral region: Secondary | ICD-10-CM | POA: Insufficient documentation

## 2021-03-15 DIAGNOSIS — R937 Abnormal findings on diagnostic imaging of other parts of musculoskeletal system: Secondary | ICD-10-CM | POA: Insufficient documentation

## 2021-03-15 NOTE — Progress Notes (Signed)
Safety precautions to be maintained throughout the outpatient stay will include: orient to surroundings, keep bed in low position, maintain call bell within reach at all times, provide assistance with transfer out of bed and ambulation.  

## 2021-03-15 NOTE — Patient Instructions (Signed)
____________________________________________________________________________________________  General Risks and Possible Complications  Patient Responsibilities: It is important that you read this as it is part of your informed consent. It is our duty to inform you of the risks and possible complications associated with treatments offered to you. It is your responsibility as a patient to read this and to ask questions about anything that is not clear or that you believe was not covered in this document.  Patient's Rights: You have the right to refuse treatment. You also have the right to change your mind, even after initially having agreed to have the treatment done. However, under this last option, if you wait until the last second to change your mind, you may be charged for the materials used up to that point.  Introduction: Medicine is not an exact science. Everything in Medicine, including the lack of treatment(s), carries the potential for danger, harm, or loss (which is by definition: Risk). In Medicine, a complication is a secondary problem, condition, or disease that can aggravate an already existing one. All treatments carry the risk of possible complications. The fact that a side effects or complications occurs, does not imply that the treatment was conducted incorrectly. It must be clearly understood that these can happen even when everything is done following the highest safety standards.  No treatment: You can choose not to proceed with the proposed treatment alternative. The "PRO(s)" would include: avoiding the risk of complications associated with the therapy. The "CON(s)" would include: not getting any of the treatment benefits. These benefits fall under one of three categories: diagnostic; therapeutic; and/or palliative. Diagnostic benefits include: getting information which can ultimately lead to improvement of the disease or symptom(s). Therapeutic benefits are those associated with  the successful treatment of the disease. Finally, palliative benefits are those related to the decrease of the primary symptoms, without necessarily curing the condition (example: decreasing the pain from a flare-up of a chronic condition, such as incurable terminal cancer).  General Risks and Complications: These are associated to most interventional treatments. They can occur alone, or in combination. They fall under one of the following six (6) categories: no benefit or worsening of symptoms; bleeding; infection; nerve damage; allergic reactions; and/or death. No benefits or worsening of symptoms: In Medicine there are no guarantees, only probabilities. No healthcare provider can ever guarantee that a medical treatment will work, they can only state the probability that it may. Furthermore, there is always the possibility that the condition may worsen, either directly, or indirectly, as a consequence of the treatment. Bleeding: This is more common if the patient is taking a blood thinner, either prescription or over the counter (example: Goody Powders, Fish oil, Aspirin, Garlic, etc.), or if suffering a condition associated with impaired coagulation (example: Hemophilia, cirrhosis of the liver, low platelet counts, etc.). However, even if you do not have one on these, it can still happen. If you have any of these conditions, or take one of these drugs, make sure to notify your treating physician. Infection: This is more common in patients with a compromised immune system, either due to disease (example: diabetes, cancer, human immunodeficiency virus [HIV], etc.), or due to medications or treatments (example: therapies used to treat cancer and rheumatological diseases). However, even if you do not have one on these, it can still happen. If you have any of these conditions, or take one of these drugs, make sure to notify your treating physician. Nerve Damage: This is more common when the treatment is an    invasive one, but it can also happen with the use of medications, such as those used in the treatment of cancer. The damage can occur to small secondary nerves, or to large primary ones, such as those in the spinal cord and brain. This damage may be temporary or permanent and it may lead to impairments that can range from temporary numbness to permanent paralysis and/or brain death. Allergic Reactions: Any time a substance or material comes in contact with our body, there is the possibility of an allergic reaction. These can range from a mild skin rash (contact dermatitis) to a severe systemic reaction (anaphylactic reaction), which can result in death. Death: In general, any medical intervention can result in death, most of the time due to an unforeseen complication. ____________________________________________________________________________________________    

## 2021-03-15 NOTE — Progress Notes (Signed)
Patient: Tyler Craig  Service Category: E/M  Provider: Gaspar Cola, MD  DOB: [birth year]  DOS: 03/15/2021  Referring Provider: Eulogio Bear, NP  MRN: [id number]  Setting: Ambulatory outpatient  PCP: Eulogio Bear, NP  Type: New Patient  Specialty: Interventional Pain Management    Location: Office  Delivery: Face-to-face     Primary Reason(s) for Visit: Encounter for initial evaluation of one or more chronic problems (new to examiner) potentially causing chronic pain, and posing a threat to normal musculoskeletal function. (Level of risk: High) CC: Neck Pain, Back Pain (low), and Shoulder Pain (right)  HPI  Tyler Craig is a 50 y.o. year old, male patient, who comes for the first time to our practice referred by Eulogio Bear, NP for our initial evaluation of his chronic pain. He has MDD (major depressive disorder); GAD (generalized anxiety disorder); Smoker; Elbow pain; Insomnia; Hyperlipidemia; Benign paroxysmal positional vertigo; DDD (degenerative disc disease), cervical; DDD (degenerative disc disease), lumbar; Cervical nerve root impingement; Cervical spinal stenosis; History of cervical spinal surgery (C4-6 ACDF); Lumbar nerve root impingement (S1) (Right); GERD (gastroesophageal reflux disease); Abdominal pain; Nausea with vomiting; Rash; Prediabetes; Chronic pain syndrome; Pharmacologic therapy; Disorder of skeletal system; Problems influencing health status; Abnormal MRI, lumbar spine (11/28/2016); Thoracic facet arthropathy; Lumbosacral facet arthropathy; Thoracic foraminal stenosis; Lumbosacral foraminal stenosis; Lumbosacral lateral recess stenosis; Abnormal MRI, cervical spine (11/28/2016); Cervicalgia; Chronic neck pain with history of cervical spinal surgery (1ry area of Pain); Chronic shoulder pain (Right); Chronic low back pain (Bilateral) w/o sciatica; Chronic upper extremity pain (Bilateral); Chronic lower extremity pain (Left); Radicular pain of shoulder  (Right); Chronic cervical radiculopathy (C6/C7) (Right); Chronic lumbar radiculitis (L5) (Left); Numbness and tingling of upper extremity (Left); and Numbness and tingling of lower extremity (Left) on their problem list. Today he comes in for evaluation of his Neck Pain, Back Pain (low), and Shoulder Pain (right)  Pain Assessment: Location:   Neck Radiating: left arm is tingly and numb, left leg is tingly and numb Onset: More than a month ago Duration: Chronic pain Quality: Tingling, Numbness, Sharp, Burning Severity: 4 /10 (subjective, self-reported pain score)  Effect on ADL: limits activities Timing: Constant Modifying factors: denies BP: (!) 174/90  HR: (!) 102  Onset and Duration: Gradual and Date of injury: 2006 Cause of pain: Unknown Severity: Getting worse, NAS-11 at its worse: 10/10, and NAS-11 now: 4/10 Timing: Not influenced by the time of the day, During activity or exercise, After activity or exercise, and After a period of immobility Aggravating Factors: Bending, Eating, Intercourse (sex), Kneeling, Lifiting, Motion, Prolonged sitting, Squatting, Stooping , Twisting, Walking, and Working Alleviating Factors: Medications and Standing Associated Problems: Depression, Fatigue, Inability to concentrate, Inability to control bladder (urine), Nausea, Numbness, Personality changes, Sadness, Spasms, Sweating, Tingling, Vomiting , Weakness, Pain that wakes patient up, and Pain that does not allow patient to sleep Quality of Pain: Aching, Annoying, Burning, Constant, Cramping, Deep, Distressing, Exhausting, and Nagging Previous Examinations or Tests: MRI scan and X-rays Previous Treatments: Narcotic medications, Steroid treatments by mouth, and Trigger point injections  According to the patient the primary area of pain is that of the neck (posterior aspect) (Bilateral) (R>L).  The patient has a history positive for 2 prior anterior cervical discectomy and fusion surgeries with the first  1 being around 2013 and the second 1 around 2018.  According to the patient the 2013 surgery was done due to severe left arm pain, numbness, and weakness.  The surgery seems to  have helped that but unfortunately it did eventually come back and on 2018 he had to undergo a second surgery.  The second surgery was due to the recurrence of the upper extremity pain and numbness on the left side but this time he was accompanied by sharp pain in the neck which went away for a while after that surgery.  The patient denies any physical therapy, recent x-rays, or having had any nerve blocks in the cervical region.  He does have a cervical MRI that dates back to 11/28/2016.  He refers that this was done prior to his second surgery.  The patient's second area pain is that of the lower back (Bilateral) (R>L).  He denies any surgeries in the lumbar region.  The patient also denies any nerve blocks, physical therapy, or recent x-rays.  The patient also has a lumbar spine MRI that dates back to 11/28/2016.  He refers that over time the pain in his lower back has worsened, especially says that 2018 MRI.  The patient's third area pain is that of the shoulder on the right side.  He denies any shoulder surgeries, recent x-rays, or physical therapy.  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  He refers that at the time he was told that if that injection did not help, they may have to do an MRI and perhaps surgery.  He states that the injection did not help but follow-up was lost secondary to the pandemia.  The patient's fourth affected area is that of the upper extremities (Bilateral) (L>R).  Indicates of the left upper extremity he complains of constant numbness with intermittent electrical-like sensations affecting his left thumb, index finger, and middle finger and what appears to be a C6 and C7 dermatomal distributions.  In the case of  the right upper extremity the pain is limited only to the shoulder, specifically on the back of the shoulder.  He denies any pain numbness or weakness below the shoulder.  The fifth area affected is that of the left lower extremity where he intermittently will experience numbness and tingling down to his toes.  He indicates that this numbness goes over the top of the foot and what appears to be an L5 dermatomal distribution.  The patient has sixth area pain is that of the right hip, which he believes that may be secondary to overcompensation to the problems that he is experiencing on the opposite side.  This pain seems to be intermittent and goes through the lateral anterior aspect of the leg down to the knee.  Pharmacology: The patient indicates having tried "everything".  However, when I specifically asked him about gabapentin (Neurontin) and pregabalin (Lyrica) he indicated that he had never heard of those medications.  He states currently taking BC powders, an average of 4/day.  In addition, he refers taking hydrocodone/APAP 5/325 2 tablets in a.m. and 2 tablets in p.m. (8:30 PM).  The prescription is written for 1 tablet every 6 hours, but he takes it, 2 tablets at a time, twice a day.  He also states that he will occasionally run out of medicine early, indicating that he is taking it perhaps more than prescribed.  In addition to this, he told the admitting nurse that he was told by a physician to do anything necessary to control his pain and therefore he also admits to smoking marijuana close to bedtime to assist him with the pain.  The patient states that the reason why he is here is because when he is primary care physician left the practice, he was assigned to a nurse practitioner who does not feel comfortable treating pain or prescribing controlled substances.  He refers that he had been going to her primarily to get off of the Prozac but instead he was changed to Cymbalta and told that this  medication with help him with the pain.  Today during the medication Reconciliation it would appear that he is no longer taking that Cymbalta.  In the past he was also prescribed some Flexeril, which apparently he is currently not taking as well.  Today I took the time to provide the patient with information regarding my pain practice. The patient was informed that my practice is divided into two sections: an interventional pain management section, as well as a completely separate and distinct medication management section. I explained that I have procedure days for my interventional therapies, and evaluation days for follow-ups and medication management. Because of the amount of documentation required during both, they are kept separated. This means that there is the possibility that he may be scheduled for a procedure on one day, and medication management the next. I have also informed him that because of staffing and facility limitations, I no longer take patients for medication management only. To illustrate the reasons for this, I gave the patient the example of surgeons, and how inappropriate it would be to refer a patient to his/her care, just to write for the post-surgical antibiotics on a surgery done by a different surgeon.   Because interventional pain management is my board-certified specialty, the patient was informed that joining my practice means that they are open to any and all interventional therapies. I made it clear that this does not mean that they will be forced to have any procedures done. What this means is that I believe interventional therapies to be essential part of the diagnosis and proper management of chronic pain conditions. Therefore, patients not interested in these interventional alternatives will be better served under the care of a different practitioner.  The patient was also made aware of my Comprehensive Pain Management Safety Guidelines where by joining my practice,  they limit all of their nerve blocks and joint injections to those done by our practice, for as long as we are retained to manage their care.   Historic Controlled Substance Pharmacotherapy Review  PMP and historical list of controlled substances: Hydrocodone/APAP 5/325, 1 tab p.o. every 6 hours (03/01/2021) Current opioid analgesics: Hydrocodone/APAP 5/325, 1 tab p.o. every 6 hours (20 mg/day of hydrocodone) (20 MME) MME/day: 20 mg/day  Historical Monitoring: The patient  reports current drug use. Drug: Marijuana. List of all UDS Test(s): No results found for: MDMA, COCAINSCRNUR, [place], [place], CANNABQUANT, THCU, [place] List of other Serum/Urine Drug Screening Test(s):  No results found for: AMPHSCRSER, BARBSCRSER, BENZOSCRSER, COCAINSCRSER, COCAINSCRNUR, PCPSCRSER, PCPQUANT, THCSCRSER, THCU, CANNABQUANT, OPIATESCRSER, OXYSCRSER, PROPOXSCRSER, ETH Historical Background Evaluation: [place] PMP: PDMP reviewed during this encounter. Online review of the past 19-monthperiod conducted.             PMP NARX Score Report:  Narcotic: 331 Sedative: 150 Stimulant: 000 [place] Department of public safety, offender search: (Editor, commissioningInformation) Non-contributory Risk Assessment Profile: Aberrant behavior: None observed or detected today Risk factors for fatal opioid overdose: None identified today PMP NARX Overdose Risk Score: 280 Fatal overdose hazard ratio (HR): Calculation deferred Non-fatal overdose hazard ratio (HR): Calculation deferred Risk of  opioid abuse or dependence: 0.7-3.0% with doses ? 36 MME/day and 6.1-26% with doses ? 120 MME/day. Substance use disorder (SUD) risk level: See below Personal History of Substance Abuse (SUD-Substance use disorder):  Alcohol: Negative  Illegal Drugs: Negative  Rx Drugs: Negative  ORT Risk Level calculation: Low Risk  Opioid Risk Tool - 03/15/21 1248       Family History of Substance Abuse   Alcohol Negative    Illegal Drugs Negative    Rx Drugs  Negative      Personal History of Substance Abuse   Alcohol Negative    Illegal Drugs Negative    Rx Drugs Negative      Age   Age between 26-45 years  No      History of Preadolescent Sexual Abuse   History of Preadolescent Sexual Abuse Negative or Male      Psychological Disease   Psychological Disease Negative    Depression Positive      Total Score   Opioid Risk Tool Scoring 1    Opioid Risk Interpretation Low Risk            ORT Scoring interpretation table:  Score <3 = Low Risk for SUD  Score between 4-7 = Moderate Risk for SUD  Score >8 = High Risk for Opioid Abuse   PHQ-2 Depression Scale:  Total score: 0  PHQ-2 Scoring interpretation table: (Score and probability of major depressive disorder)  Score 0 = No depression  Score 1 = 15.4% Probability  Score 2 = 21.1% Probability  Score 3 = 38.4% Probability  Score 4 = 45.5% Probability  Score 5 = 56.4% Probability  Score 6 = 78.6% Probability   PHQ-9 Depression Scale:  Total score: 0  PHQ-9 Scoring interpretation table:  Score 0-4 = No depression  Score 5-9 = Mild depression  Score 10-14 = Moderate depression  Score 15-19 = Moderately severe depression  Score 20-27 = Severe depression (2.4 times higher risk of SUD and 2.89 times higher risk of overuse)   Pharmacologic Plan: As per protocol, I have not taken over any controlled substance management, pending the results of ordered tests and/or consults.            Initial impression: Pending review of available data and ordered tests.  Meds   Current Outpatient Medications:    atorvastatin (LIPITOR) 80 MG tablet, Take 51m once a day, Disp: 90 tablet, Rfl: 1   calcium carbonate (TUMS - DOSED IN MG ELEMENTAL CALCIUM) 500 MG chewable tablet, Chew 3 tablets by mouth 2 (two) times daily as needed for indigestion or heartburn., Disp: , Rfl:    celecoxib (CELEBREX) 200 MG capsule, Take 2024monce a day for inflammation, Disp: 90 capsule, Rfl: 1   DULoxetine 40  MG CPEP, Take 40 mg by mouth daily., Disp: 90 capsule, Rfl: 1   HYDROcodone-acetaminophen (NORCO) 5-325 MG tablet, Take 1 tablet by mouth every 6 (six) hours as needed for moderate pain or severe pain. Dx: G89.4, G54.2, Disp: 60 tablet, Rfl: 0   omeprazole (PRILOSEC) 40 MG capsule, TAKE 1 CAPSULE(40 MG) BY MOUTH DAILY FOR ABDOMINAL PAIN, Disp: 30 capsule, Rfl: 3   triamcinolone ointment (KENALOG) 0.1 %, Apply 1 application topically 2 (two) times daily. Apply sparingly to affected areas.  Do not use for more than 2 weeks consistently, notify prescriber if this is not working after about 1 week., Disp: 30 g, Rfl: 0  Imaging Review  Cervical Imaging: Cervical MR wo contrast: Results for  orders placed during the hospital encounter of 11/28/16 MR Cervical Spine Wo Contrast  Narrative CLINICAL DATA:  Initial evaluation for increased pain and paresthesia of left hand. History degenerative disc disease. History of prior cervical spinal fusion.  EXAM: MRI CERVICAL SPINE WITHOUT CONTRAST  TECHNIQUE: Multiplanar, multisequence MR imaging of the cervical spine was performed. No intravenous contrast was administered.  COMPARISON:  Prior MRI from 10/29/2011.  FINDINGS: Alignment: Vertebral bodies normally aligned with preservation of the normal cervical lordosis. No listhesis.  Vertebrae: Patient is status post ACDF at C6-7. Signal intensity within the vertebral body bone marrow within normal limits. No evidence for acute or chronic fracture. No focal osseous lesions. No abnormal marrow edema.  Cord: Signal intensity within the cervical spinal cord is normal.  Posterior Fossa, vertebral arteries, paraspinal tissues: Visualized brain and posterior fossa within normal limits. Probable minimal small vessel type changes present within the pons. Craniocervical junction normal. Paraspinous and prevertebral soft tissues within normal limits. Normal intravascular flow voids present within  the vertebral arteries bilaterally.  Disc levels:  C2-C3: Right foraminal disc osteophyte complex with resultant mild right foraminal stenosis, similar to previous. Minimal posterior disc bulge without significant canal stenosis. Left neural foramina widely patent.  C3-C4: Broad posterior disc osteophyte complex indents and partially faces the ventral thecal sac and results in mild canal stenosis. This is stable from previous. No significant foraminal encroachment.  C4-C5: Diffuse degenerative disc bulge with bilateral uncovertebral spurring, greater on the left. Broad right eccentric disc osteophyte complex encroaches upon the right neural foramen, resulting in moderate right foraminal stenosis, similar to previous. This potentially affects the right C5 nerve root. Flattening of the right ventral thecal sac and right aspect of the cervical spinal cord without cord signal changes, similar to previous. Mild canal stenosis. Mild left foraminal narrowing, also similar.  C5-C6: Diffuse degenerative disc bulge with bilateral uncovertebral spurring. Protruding disc flattens and indents the ventral thecal sac with mild cord flattening, potentially affecting either of the C6 nerve roots (series 6, image 20). No associated cord signal changes. Resultant mild-to-moderate canal stenosis. This is slightly progressed from previous. Superimposed uncovertebral hypertrophy with resultant mild bilateral foraminal stenosis, greater on the left.  C6-C7:  Prior ACDF.  No residual stenosis.  C7-T1: Shallow posterior disc bulge minimally flattens the ventral thecal sac. Bilateral facet hypertrophy. No stenosis.  Mild disc bulging noted at T2-3 without significant stenosis. Remainder the visualized upper thoracic spine unremarkable.  IMPRESSION: 1. Interval ACDF at C6-7 without residual stenosis. 2. Broad posterior disc bulge at C5-6 with resultant mild to moderate canal stenosis, slightly  progressed from previous. This could potentially effect either of the C6 nerve roots. 3. Broad right eccentric disc osteophyte complex at C4-5 with resultant mild canal and moderate right foraminal stenosis, potentially affecting the right C5 nerve root. This is similar to previous. 4. Mildly progressed degenerative disc bulge at C7-T1 without stenosis.   Electronically Signed By: Jeannine Boga M.D. On: 11/28/2016 17:33  Cervical DG 2-3 views: Results for orders placed during the hospital encounter of 02/04/17 DG Cervical Spine 2-3 Views  Narrative CLINICAL DATA:  C4-5 and C5-6 ACDF  EXAM: CERVICAL SPINE - 2-3 VIEW; DG C-ARM 61-120 MIN  COMPARISON:  Preoperative MRI 11/28/2016  FINDINGS: Interval C4-5 and C5-6 anterior cervical discectomy. Hardware is in expected position. No malalignment.  IMPRESSION: Fluoroscopy for C4-5 and C5-6 ACDF.  No acute finding.   Electronically Signed By: Monte Fantasia M.D. On: 02/04/2017 15:16  Lumbosacral Imaging:  Lumbar MR wo contrast: Results for orders placed during the hospital encounter of 11/28/16 MR Lumbar Spine Wo Contrast  Narrative CLINICAL DATA:  Initial evaluation for lower back pain radiating down left leg for 1 month.  EXAM: MRI LUMBAR SPINE WITHOUT CONTRAST  TECHNIQUE: Multiplanar, multisequence MR imaging of the lumbar spine was performed. No intravenous contrast was administered.  COMPARISON:  None available.  FINDINGS: Segmentation: Normal segmentation. Lowest well-formed disc is labeled the L5-S1 level.  Alignment: Vertebral bodies normally aligned with preservation of the normal lumbar lordosis. No listhesis.  Vertebrae: Vertebral body heights are well maintained. No evidence for acute or chronic fracture. Signal intensity within the vertebral body bone marrow is normal. Minimal reactive endplate changes noted at the superior endplate of O27. No focal osseous lesions.  Conus medullaris:  Extends to the L2 level and appears normal.  Paraspinal and other soft tissues: Paraspinous soft tissues within normal limits. Visualized visceral structures unremarkable.  Disc levels:  T11-12: Diffuse degenerative disc bulge with disc desiccation. Facet degeneration. No significant canal narrowing. Mild bilateral foraminal stenosis.  T12-L1:  Unremarkable.  L1-2:  Unremarkable.  L2-3:  Unremarkable.  L3-4: Diffuse circumferential disc bulge with disc desiccation and mild intervertebral disc space narrowing. Minimal reactive endplate changes. No significant canal stenosis. Mild bilateral foraminal narrowing.  L4-5: Minimal annular disc bulge with disc desiccation. No focal disc protrusion. No significant canal or lateral recess stenosis. Mild bilateral foraminal narrowing.  L5-S1: Diffuse disc bulge with disc desiccation. Superimposed right subarticular disc protrusion with slight caudad angulation. Protruding disc encroaches upon the right lateral recess, abutting the transiting right S1 nerve root (series 6, image 33). Right S1 nerve root slightly displaced posteriorly. Minimal facet degeneration. Mild bilateral foraminal stenosis.  IMPRESSION: 1. Right subarticular disc protrusion at L5-S1, contacting and slightly displacing the transiting S1 nerve root in the right lateral recess. 2. Mild degenerative spondylolysis at L3-4 and L4-5 with resultant mild bilateral foraminal stenosis. 3. Mild degenerative disc bulge at T11-12 with resultant mild bilateral foraminal narrowing. 4. Mild bilateral facet arthrosis at L5-S1.   Electronically Signed By: Jeannine Boga M.D. On: 11/28/2016 17:40  Complexity Note: Imaging results reviewed. Results shared with Mr. Borsuk, using Layman's terms.                        ROS  Cardiovascular: No reported cardiovascular signs or symptoms such as High blood pressure, coronary artery disease, abnormal heart rate or rhythm,  heart attack, blood thinner therapy or heart weakness and/or failure Pulmonary or Respiratory: No reported pulmonary signs or symptoms such as wheezing and difficulty taking a deep full breath (Asthma), difficulty blowing air out (Emphysema), coughing up mucus (Bronchitis), persistent dry cough, or temporary stoppage of breathing during sleep Neurological: No reported neurological signs or symptoms such as seizures, abnormal skin sensations, urinary and/or fecal incontinence, being born with an abnormal open spine and/or a tethered spinal cord Psychological-Psychiatric: Depressed Gastrointestinal: Vomiting blood (Ulcers) Genitourinary: No reported renal or genitourinary signs or symptoms such as difficulty voiding or producing urine, peeing blood, non-functioning kidney, kidney stones, difficulty emptying the bladder, difficulty controlling the flow of urine, or chronic kidney disease Hematological: No reported hematological signs or symptoms such as prolonged bleeding, low or poor functioning platelets, bruising or bleeding easily, hereditary bleeding problems, low energy levels due to low hemoglobin or being anemic Endocrine: No reported endocrine signs or symptoms such as high or low blood sugar, rapid heart rate due to high thyroid  levels, obesity or weight gain due to slow thyroid or thyroid disease Rheumatologic: No reported rheumatological signs and symptoms such as fatigue, joint pain, tenderness, swelling, redness, heat, stiffness, decreased range of motion, with or without associated rash Musculoskeletal: Negative for myasthenia gravis, muscular dystrophy, multiple sclerosis or malignant hyperthermia Work History: Working full time  Allergies  Mr. Howat is allergic to no known allergies.  Laboratory Chemistry Profile   Renal Lab Results  Component Value Date   BUN 14 01/25/2021   CREATININE 0.91 59/93/5701   BCR NOT APPLICABLE 77/93/9030   GFRAA 114 01/25/2021   GFRNONAA 99  01/25/2021   PROTEINUR NEGATIVE 01/30/2017     Electrolytes Lab Results  Component Value Date   NA 141 01/25/2021   K 4.6 01/25/2021   CL 106 01/25/2021   CALCIUM 9.5 01/25/2021     Hepatic Lab Results  Component Value Date   AST 18 08/26/2020   ALT 24 08/26/2020   ALBUMIN 4.2 01/30/2017   ALKPHOS 55 01/30/2017   LIPASE 49 08/03/2019     ID Lab Results  Component Value Date   HIV NON-REACTIVE 04/20/2020   STAPHAUREUS NEGATIVE 01/30/2017   MRSAPCR NEGATIVE 01/30/2017     Bone Lab Results  Component Value Date   VD25OH 28 (L) 06/02/2015   TESTOSTERONE 312 06/02/2015     Endocrine Lab Results  Component Value Date   GLUCOSE 79 01/25/2021   GLUCOSEU NEGATIVE 01/30/2017   HGBA1C 5.7 (H) 01/25/2021   TSH 0.409 10/27/2014   TESTOSTERONE 312 06/02/2015     Neuropathy Lab Results  Component Value Date   VITAMINB12 842 06/02/2015   HGBA1C 5.7 (H) 01/25/2021   HIV NON-REACTIVE 04/20/2020     CNS No results found for: COLORCSF, APPEARCSF, RBCCOUNTCSF, WBCCSF, POLYSCSF, LYMPHSCSF, EOSCSF, PROTEINCSF, GLUCCSF, JCVIRUS, CSFOLI, IGGCSF, LABACHR, ACETBL, LABACHR, ACETBL   Inflammation (CRP: Acute  ESR: Chronic) No results found for: CRP, ESRSEDRATE, LATICACIDVEN   Rheumatology No results found for: RF, ANA, LABURIC, URICUR, LYMEIGGIGMAB, LYMEABIGMQN, HLAB27   Coagulation Lab Results  Component Value Date   PLT 328 08/26/2020     Cardiovascular Lab Results  Component Value Date   HGB 14.6 08/26/2020   HCT 42.9 08/26/2020     Screening Lab Results  Component Value Date   STAPHAUREUS NEGATIVE 01/30/2017   MRSAPCR NEGATIVE 01/30/2017   HIV NON-REACTIVE 04/20/2020     Cancer No results found for: CEA, CA125, LABCA2   Allergens No results found for: ALMOND, APPLE, ASPARAGUS, AVOCADO, BANANA, BARLEY, BASIL, BAYLEAF, GREENBEAN, LIMABEAN, WHITEBEAN, BEEFIGE, REDBEET, BLUEBERRY, BROCCOLI, CABBAGE, MELON, CARROT, CASEIN, CASHEWNUT, CAULIFLOWER, CELERY      Note: Lab results reviewed.  PFSH  Drug: Mr. Bergland  reports current drug use. Drug: Marijuana. Alcohol:  reports previous alcohol use. Tobacco:  reports that he has been smoking cigarettes. He has a 44.00 pack-year smoking history. He has never used smokeless tobacco. Medical:  has a past medical history of Anxiety, Hyperlipidemia, Muscle spasm, Numbness, and PONV (postoperative nausea and vomiting). Family: family history includes Colon cancer (age of onset: 49) in his father; Heart disease in his maternal grandfather; Heart disease (age of onset: 96) in his maternal uncle; Hypertension in his mother.  Past Surgical History:  Procedure Laterality Date   ANTERIOR CERVICAL DECOMP/DISCECTOMY FUSION N/A 02/04/2017   Procedure: C4-5, C5-6 Anterior Cervical Discectomy and Fusion, Allograft and Plate at S9-2 and Zero P at C5-6;  Surgeon: Marybelle Killings, MD;  Location: [facility];  Service: Orthopedics;  Laterality: N/A;  EYE SURGERY     left eye artificial lens placed   HERNIA REPAIR     SPINE SURGERY     neck surgery- has plate   Active Ambulatory Problems    Diagnosis Date Noted   MDD (major depressive disorder) 12/29/2013   GAD (generalized anxiety disorder) 12/29/2013   Smoker 12/29/2013   Elbow pain 02/09/2014   Insomnia 07/05/2014   Hyperlipidemia 11/16/2014   Benign paroxysmal positional vertigo 08/03/2016   DDD (degenerative disc disease), cervical 12/12/2016   DDD (degenerative disc disease), lumbar 12/12/2016   Cervical nerve root impingement 12/12/2016   Cervical spinal stenosis 02/04/2017   History of cervical spinal surgery (C4-6 ACDF) 04/04/2017   Lumbar nerve root impingement (S1) (Right) 04/21/2018   GERD (gastroesophageal reflux disease) 09/30/2019   Abdominal pain 09/30/2019   Nausea with vomiting 06/09/2020   Rash 12/27/2020   Prediabetes 01/26/2021   Chronic pain syndrome 03/15/2021   Pharmacologic therapy 03/15/2021   Disorder of skeletal system 03/15/2021    Problems influencing health status 03/15/2021   Abnormal MRI, lumbar spine (11/28/2016) 03/15/2021   Thoracic facet arthropathy 03/15/2021   Lumbosacral facet arthropathy 03/15/2021   Thoracic foraminal stenosis 03/15/2021   Lumbosacral foraminal stenosis 03/15/2021   Lumbosacral lateral recess stenosis 03/15/2021   Abnormal MRI, cervical spine (11/28/2016) 03/15/2021   Cervicalgia 03/15/2021   Chronic neck pain with history of cervical spinal surgery (1ry area of Pain) 03/15/2021   Chronic shoulder pain (Right) 03/15/2021   Chronic low back pain (Bilateral) w/o sciatica 03/15/2021   Chronic upper extremity pain (Bilateral) 03/15/2021   Chronic lower extremity pain (Left) 03/15/2021   Radicular pain of shoulder (Right) 03/15/2021   Chronic cervical radiculopathy (C6/C7) (Right) 03/15/2021   Chronic lumbar radiculitis (L5) (Left) 03/15/2021   Numbness and tingling of upper extremity (Left) 03/15/2021   Numbness and tingling of lower extremity (Left) 03/15/2021   Resolved Ambulatory Problems    Diagnosis Date Noted   No Resolved Ambulatory Problems   Past Medical History:  Diagnosis Date   Anxiety    Muscle spasm    Numbness    PONV (postoperative nausea and vomiting)    Constitutional Exam  General appearance: Well nourished, well developed, and well hydrated. In no apparent acute distress Vitals:   03/15/21 1240  BP: (!) 174/90  Pulse: (!) 102  Resp: 16  Temp: (!) 97 F (36.1 C)  SpO2: 100%  Weight: 190 lb (86.2 kg)  Height: 5' 10" (1.778 m)   BMI Assessment: Estimated body mass index is 27.26 kg/m as calculated from the following:   Height as of this encounter: 5' 10" (1.778 m).   Weight as of this encounter: 190 lb (86.2 kg).  BMI interpretation table: BMI level Category Range association with higher incidence of chronic pain  <18 kg/m2 Underweight   18.5-24.9 kg/m2 Ideal body weight   25-29.9 kg/m2 Overweight Increased incidence by 20%  30-34.9 kg/m2 Obese  (Class I) Increased incidence by 68%  35-39.9 kg/m2 Severe obesity (Class II) Increased incidence by 136%  >40 kg/m2 Extreme obesity (Class III) Increased incidence by 254%   Patient's current BMI Ideal Body weight  Body mass index is 27.26 kg/m. Ideal body weight: 73 kg (160 lb 15 oz) Adjusted ideal body weight: 78.3 kg (172 lb 9 oz)   BMI Readings from Last 4 Encounters:  03/15/21 27.26 kg/m  03/01/21 26.50 kg/m  01/25/21 26.30 kg/m  12/26/20 26.04 kg/m   Wt Readings from Last 4 Encounters:  03/15/21 190  lb (86.2 kg)  03/01/21 190 lb (86.2 kg)  01/25/21 188 lb 9.6 oz (85.5 kg)  12/26/20 186 lb 11.2 oz (84.7 kg)    Psych/Mental status: Alert, oriented x 3 (person, place, & time)       Eyes: PERLA Respiratory: No evidence of acute respiratory distress  Assessment  Primary Diagnosis & Pertinent Problem List: The primary encounter diagnosis was Chronic pain syndrome. Diagnoses of Pharmacologic therapy, Disorder of skeletal system, Problems influencing health status, Cervicalgia, Chronic neck pain with history of cervical spinal surgery (1ry area of Pain), Chronic shoulder pain (Right), Chronic low back pain (Bilateral) w/o sciatica, Chronic upper extremity pain (Bilateral), Chronic lower extremity pain (Left), History of cervical spinal surgery (C4-6 ACDF), Other intervertebral disc degeneration, lumbar region, Radicular pain of shoulder (Right), Chronic cervical radiculopathy (C6/C7) (Right), Chronic lumbar radiculitis (L5) (Left), Numbness and tingling of upper extremity (Left), and Numbness and tingling of lower extremity (Left) were also pertinent to this visit.  Visit Diagnosis (New problems to examiner): 1. Chronic pain syndrome   2. Pharmacologic therapy   3. Disorder of skeletal system   4. Problems influencing health status   5. Cervicalgia   6. Chronic neck pain with history of cervical spinal surgery (1ry area of Pain)   7. Chronic shoulder pain (Right)   8. Chronic  low back pain (Bilateral) w/o sciatica   9. Chronic upper extremity pain (Bilateral)   10. Chronic lower extremity pain (Left)   11. History of cervical spinal surgery (C4-6 ACDF)   12. Other intervertebral disc degeneration, lumbar region   13. Radicular pain of shoulder (Right)   14. Chronic cervical radiculopathy (C6/C7) (Right)   15. Chronic lumbar radiculitis (L5) (Left)   16. Numbness and tingling of upper extremity (Left)   17. Numbness and tingling of lower extremity (Left)    Plan of Care (Initial workup plan)  Note: Mr. Cambridge was reminded that as per protocol, today's visit has been an evaluation only. We have not taken over the patient's controlled substance management.  Problem-specific plan: No problem-specific Assessment & Plan notes found for this encounter. Lab Orders  Compliance Drug Analysis, Ur  Comp. Metabolic Panel (12)  Magnesium  Vitamin B12  Sedimentation rate  25-Hydroxy vitamin D Lcms D2+D3  C-reactive protein   Imaging Orders  DG Cervical Spine With Flex & Extend  DG Lumbar Spine Complete W/Bend  DG Shoulder Right  MR CERVICAL SPINE WO CONTRAST  MR LUMBAR SPINE WO CONTRAST   Referral Orders  No referral(s) requested today   Procedure Orders    No procedure(s) ordered today   Pharmacotherapy (current): Medications ordered:  No orders of the defined types were placed in this encounter.  Medications administered during this visit: Devona Konig. Knupp had no medications administered during this visit.   Pharmacological management options:  Opioid Analgesics: The patient was informed that there is no guarantee that he would be a candidate for opioid analgesics. The decision will be made following CDC guidelines. This decision will be based on the results of diagnostic studies, as well as Mr. Olivo's risk profile.   Membrane stabilizer: To be determined at a later time  Muscle relaxant: To be determined at a later time  NSAID: To be determined at  a later time  Other analgesic(s): To be determined at a later time   Interventional management options: Mr. Windsor was informed that there is no guarantee that he would be a candidate for interventional therapies. The decision will be based  on the results of diagnostic studies, as well as Mr. Heskett's risk profile.  Procedure(s) under consideration:  Pending results of ordered studies    Interventional Therapies  Risk  Complexity Considerations:   Estimated body mass index is 27.26 kg/m as calculated from the following:   Height as of this encounter: 5' 10" (1.778 m).   Weight as of this encounter: 190 lb (86.2 kg). WNL   Planned  Pending:   Pending further evaluation   Under consideration:   Diagnostic right cervical epidural steroid injection #1  Diagnostic bilateral cervical facet block  Diagnostic bilateral lumbar facet MBB  Diagnostic caudal ESI + epidurogram  Diagnostic right IA shoulder joint injection    Completed:   None at this time   Therapeutic  Palliative (PRN) options:   None established   Provider-requested follow-up: Return for evaluation day (40 min) 2nd Visit "Plan of Care".  Future Appointments  Date Time Provider [place]  03/31/2021  4:15 PM Eulogio Bear, NP [id number] None    Note by: Gaspar Cola, MD Date: 03/15/2021; Time: 2:37 PM

## 2021-03-16 ENCOUNTER — Telehealth: Payer: Self-pay

## 2021-03-16 NOTE — Telephone Encounter (Signed)
Both MRI's denied because the patient has not had any PT or home exercise documented.

## 2021-03-23 LAB — COMPLIANCE DRUG ANALYSIS, UR

## 2021-03-31 ENCOUNTER — Other Ambulatory Visit: Payer: Self-pay

## 2021-03-31 ENCOUNTER — Ambulatory Visit (INDEPENDENT_AMBULATORY_CARE_PROVIDER_SITE_OTHER): Payer: BC Managed Care – PPO | Admitting: Nurse Practitioner

## 2021-03-31 ENCOUNTER — Encounter: Payer: Self-pay | Admitting: Nurse Practitioner

## 2021-03-31 VITALS — BP 128/80 | HR 91 | Temp 98.6°F | Ht 70.0 in | Wt 188.6 lb

## 2021-03-31 DIAGNOSIS — K219 Gastro-esophageal reflux disease without esophagitis: Secondary | ICD-10-CM

## 2021-03-31 DIAGNOSIS — M5030 Other cervical disc degeneration, unspecified cervical region: Secondary | ICD-10-CM | POA: Diagnosis not present

## 2021-03-31 DIAGNOSIS — M5136 Other intervertebral disc degeneration, lumbar region: Secondary | ICD-10-CM

## 2021-03-31 DIAGNOSIS — E782 Mixed hyperlipidemia: Secondary | ICD-10-CM

## 2021-03-31 DIAGNOSIS — F331 Major depressive disorder, recurrent, moderate: Secondary | ICD-10-CM

## 2021-03-31 MED ORDER — OMEPRAZOLE 40 MG PO CPDR: 40.0000 mg | DELAYED_RELEASE_CAPSULE | Freq: Every day | ORAL | 1 refills | Status: DC | PRN

## 2021-03-31 MED ORDER — ATORVASTATIN CALCIUM 80 MG PO TABS: ORAL_TABLET | ORAL | 1 refills | Status: DC

## 2021-03-31 MED ORDER — HYDROCODONE-ACETAMINOPHEN 5-325 MG PO TABS: 1.0000 | ORAL_TABLET | Freq: Four times a day (QID) | ORAL | 0 refills | Status: DC | PRN

## 2021-03-31 NOTE — Assessment & Plan Note (Signed)
Chronic.  PHQ-9 and GAD-7 remain elevated today, patient denies SI/HI.  Will plan to continue on duloxetine 40 mg daily, discussed increasing to 60 mg but patient does not desire at this time.  Will plan to follow up in 2 months to see how duloxetine is going.

## 2021-03-31 NOTE — Progress Notes (Signed)
Subjective:    Patient ID: Tyler Craig, male    DOB: [birth year], 50 y.o.   MRN: [id number]  HPI: Tyler Craig is a 50 y.o. male presenting for  Chief Complaint  Patient presents with   Depression   Medication Refill    Hydrocodone   DEPRESSION Currently taking duloxetine 40 mg daily. Stopped taking this medication for about 1 week, so it is a bit hard for him to tell how much it is helping.  He does state he doesn't think its hurting him. Overall, he seems to be in a much more positive mood today.   Depression screen [facility] 2/9 03/31/2021 03/15/2021 03/01/2021 01/25/2021 08/26/2020  Decreased Interest 3 0 3 3 3   Down, Depressed, Hopeless 2 0 3 3 3   PHQ - 2 Score 5 0 6 6 6   Altered sleeping 3 - 3 3 2   Tired, decreased energy 2 - 3 3 2   Change in appetite 2 - 2 3 1   Feeling bad or failure about yourself  2 - 1 2 0  Trouble concentrating 2 - 3 3 2   Moving slowly or fidgety/restless 0 - 1 0 0  Suicidal thoughts 0 - 0 0 0  PHQ-9 Score 16 - 19 20 13   Difficult doing work/chores Very difficult - Somewhat difficult Very difficult -  Some recent data might be hidden    GAD 7 : Generalized Anxiety Score 03/31/2021 03/01/2021 01/25/2021  Nervous, Anxious, on Edge 3 3 3   Control/stop worrying 2 2 3   Worry too much - different things 2 2 2   Trouble relaxing 3 3 3   Restless 2 1 3   Easily annoyed or irritable 3 3 3   Afraid - awful might happen 1 0 0  Total GAD 7 Score 16 14 17   Anxiety Difficulty Very difficult Somewhat difficult Very difficult   CHRONIC PAIN  Currently taking hydrocodone-acetaminophen 5-325 2 tablets at  night time.  He reports this takes the edge off of his pain.  He has been referred to pain management and had his initial visit.  He is trying to find time to schedule the testing so he can have a follow up visit and so they can discuss long term treatment.  The pain medication allows him to function and get some sleep.  Without the pain medication, he is in so much  pain he cannot even sleep.  He also takes Celebrex daily and BC goody.  He is on omeprazole to protect his stomach and does take this daily.  He is requesting refills today.    Allergies  Allergen Reactions   No Known Allergies     Outpatient Encounter Medications as of 03/31/2021  Medication Sig   atorvastatin (LIPITOR) 80 MG tablet Take 80mg  once a day   calcium carbonate (TUMS - DOSED IN MG ELEMENTAL CALCIUM) 500 MG chewable tablet Chew 3 tablets by mouth 2 (two) times daily as needed for indigestion or heartburn.   celecoxib (CELEBREX) 200 MG capsule Take 200mg  once a day for inflammation   DULoxetine 40 MG CPEP Take 40 mg by mouth daily.   HYDROcodone-acetaminophen (NORCO) 5-325 MG tablet Take 1 tablet by mouth every 6 (six) hours as needed for moderate pain or severe pain. Dx: G89.4, G54.2   omeprazole (PRILOSEC) 40 MG capsule TAKE 1 CAPSULE(40 MG) BY MOUTH DAILY FOR ABDOMINAL PAIN   triamcinolone ointment (KENALOG) 0.1 % Apply 1 application topically 2 (two) times daily. Apply sparingly to affected areas.  Do  not use for more than 2 weeks consistently, notify prescriber if this is not working after about 1 week.   No facility-administered encounter medications on file as of 03/31/2021.    Patient Active Problem List   Diagnosis Date Noted   Chronic pain syndrome 03/15/2021   Pharmacologic therapy 03/15/2021   Disorder of skeletal system 03/15/2021   Problems influencing health status 03/15/2021   Abnormal MRI, lumbar spine (11/28/2016) 03/15/2021   Thoracic facet arthropathy 03/15/2021   Lumbosacral facet arthropathy 03/15/2021   Thoracic foraminal stenosis 03/15/2021   Lumbosacral foraminal stenosis 03/15/2021   Lumbosacral lateral recess stenosis 03/15/2021   Abnormal MRI, cervical spine (11/28/2016) 03/15/2021   Cervicalgia 03/15/2021   Chronic neck pain with history of cervical spinal surgery (1ry area of Pain) 03/15/2021   Chronic shoulder pain (Right) 03/15/2021    Chronic low back pain (Bilateral) w/o sciatica 03/15/2021   Chronic upper extremity pain (Bilateral) 03/15/2021   Chronic lower extremity pain (Left) 03/15/2021   Radicular pain of shoulder (Right) 03/15/2021   Chronic cervical radiculopathy (C6/C7) (Right) 03/15/2021   Chronic lumbar radiculitis (L5) (Left) 03/15/2021   Numbness and tingling of upper extremity (Left) 03/15/2021   Numbness and tingling of lower extremity (Left) 03/15/2021   Prediabetes 01/26/2021   Rash 12/27/2020   Nausea with vomiting 06/09/2020   GERD (gastroesophageal reflux disease) 09/30/2019   Abdominal pain 09/30/2019   Lumbar nerve root impingement (S1) (Right) 04/21/2018   History of cervical spinal surgery (C4-6 ACDF) 04/04/2017   Cervical spinal stenosis 02/04/2017   DDD (degenerative disc disease), cervical 12/12/2016   DDD (degenerative disc disease), lumbar 12/12/2016   Cervical nerve root impingement 12/12/2016   Benign paroxysmal positional vertigo 08/03/2016   Hyperlipidemia 11/16/2014   Insomnia 07/05/2014   Elbow pain 02/09/2014   MDD (major depressive disorder) 12/29/2013   GAD (generalized anxiety disorder) 12/29/2013   Smoker 12/29/2013    Past Medical History:  Diagnosis Date   Anxiety    takes Fluoxetine daily   Hyperlipidemia    takes Atorvastatin daily   Muscle spasm    takes Flexeril as needed   Numbness    left arm and left leg.Tingling as well   PONV (postoperative nausea and vomiting)     Relevant past medical, surgical, family and social history reviewed and updated as indicated. Interim medical history since our last visit reviewed.  Review of Systems Per HPI unless specifically indicated above     Objective:    BP 128/80   Pulse 91   Temp 98.6 F (37 C)   Ht 5\' 10"  (1.778 m)   Wt 188 lb 9.6 oz (85.5 kg)   SpO2 98%   BMI 27.06 kg/m   Wt Readings from Last 3 Encounters:  03/31/21 188 lb 9.6 oz (85.5 kg)  03/15/21 190 lb (86.2 kg)  03/01/21 190 lb (86.2 kg)     Physical Exam Vitals and nursing note reviewed.  Constitutional:      General: He is not in acute distress.    Appearance: Normal appearance. He is not toxic-appearing.  Cardiovascular:     Rate and Rhythm: Normal rate and regular rhythm.     Heart sounds: Normal heart sounds. No murmur heard. Pulmonary:     Effort: Pulmonary effort is normal. No respiratory distress.     Breath sounds: Normal breath sounds. No wheezing, rhonchi or rales.  Skin:    General: Skin is warm and dry.     Coloration: Skin is not jaundiced or  pale.     Findings: No erythema.  Neurological:     Mental Status: He is alert and oriented to person, place, and time.     Motor: No weakness.     Gait: Gait normal.  Psychiatric:        Mood and Affect: Mood normal.        Behavior: Behavior normal.        Thought Content: Thought content normal.        Judgment: Judgment normal.      Assessment & Plan:   Problem List Items Addressed This Visit       Digestive   GERD (gastroesophageal reflux disease)    Chronic.  Encouraged cutting back on BC goody powders secondary to Celebrex use.  Continue omeprazole for stomach protection.  Refills given.          Musculoskeletal and Integument   DDD (degenerative disc disease), lumbar (Chronic)    Chronic.  Continues on Norco 5-325 every 6 hours as needed to help with pain.  Has seen pain management provider for initial visit and is scheduling further workup.  PDMP reviewed and appropriate - will refill Norco.  Plan to follow up in 2 months.        DDD (degenerative disc disease), cervical (Chronic)    Chronic.  Continues on Norco 5-325 every 6 hours as needed to help with pain.  Has seen pain management provider for initial visit and is scheduling further workup.  PDMP reviewed and appropriate - will refill Norco.  Plan to follow up in 2 months.          Other   MDD (major depressive disorder)    Chronic.  PHQ-9 and GAD-7 remain elevated today, patient  denies SI/HI.  Will plan to continue on duloxetine 40 mg daily, discussed increasing to 60 mg but patient does not desire at this time.  Will plan to follow up in 2 months to see how duloxetine is going.        Hyperlipidemia - Primary    Chronic.  Due for lipid recheck in November.  In meantime, will refill atorvastatin 80 mg daily.  Follow up in November.         Follow up plan: Return in about 2 months (around 06/01/2021) for pain and mood f/u.

## 2021-03-31 NOTE — Assessment & Plan Note (Signed)
Chronic.  Continues on Norco 5-325 every 6 hours as needed to help with pain.  Has seen pain management provider for initial visit and is scheduling further workup.  PDMP reviewed and appropriate - will refill Norco.  Plan to follow up in 2 months.

## 2021-03-31 NOTE — Assessment & Plan Note (Signed)
Chronic.  Due for lipid recheck in November.  In meantime, will refill atorvastatin 80 mg daily.  Follow up in November.

## 2021-03-31 NOTE — Assessment & Plan Note (Signed)
Chronic.  Encouraged cutting back on BC goody powders secondary to Celebrex use.  Continue omeprazole for stomach protection.  Refills given.

## 2021-03-31 NOTE — Assessment & Plan Note (Signed)
Chronic.  Continues on Norco 5-325 every 6 hours as needed to help with pain.  Has seen pain management provider for initial visit and is scheduling further workup.  PDMP reviewed and appropriate - will refill Norco.  Plan to follow up in 2 months.  

## 2021-04-05 ENCOUNTER — Telehealth: Payer: Self-pay | Admitting: *Deleted

## 2021-04-05 NOTE — Telephone Encounter (Signed)
Received request from pharmacy for PA on Hydrocodone/APAP.  PA submitted.   Dx: G89.4- chronic pain syndrome, G54.2- cervical root impairment.  Your information has been submitted to [employer]. [employer] is reviewing the PA request and you will receive an electronic response. You may check for the updated outcome later by reopening this request. The standard fax determination will also be sent to you directly.  If you have any questions about your PA submission, contact [employer] at [phone].

## 2021-04-07 NOTE — Telephone Encounter (Signed)
Received PA determination.   PA approved 04/06/2021 through 07/07/2021.

## 2021-04-11 ENCOUNTER — Other Ambulatory Visit: Payer: Self-pay | Admitting: Nurse Practitioner

## 2021-04-11 DIAGNOSIS — F411 Generalized anxiety disorder: Secondary | ICD-10-CM

## 2021-04-11 DIAGNOSIS — F339 Major depressive disorder, recurrent, unspecified: Secondary | ICD-10-CM

## 2021-05-01 ENCOUNTER — Other Ambulatory Visit: Payer: Self-pay

## 2021-05-01 DIAGNOSIS — M5136 Other intervertebral disc degeneration, lumbar region: Secondary | ICD-10-CM

## 2021-05-01 DIAGNOSIS — M5030 Other cervical disc degeneration, unspecified cervical region: Secondary | ICD-10-CM

## 2021-05-03 MED ORDER — HYDROCODONE-ACETAMINOPHEN 5-325 MG PO TABS: 1.0000 | ORAL_TABLET | Freq: Four times a day (QID) | ORAL | 0 refills | Status: DC | PRN

## 2021-05-03 NOTE — Telephone Encounter (Signed)
PDMP reviewed and appropriate.  Has appointment with pain management.

## 2021-05-09 ENCOUNTER — Other Ambulatory Visit: Payer: Self-pay

## 2021-05-09 DIAGNOSIS — E782 Mixed hyperlipidemia: Secondary | ICD-10-CM

## 2021-05-09 MED ORDER — ATORVASTATIN CALCIUM 80 MG PO TABS: ORAL_TABLET | ORAL | 0 refills | Status: DC

## 2021-05-12 ENCOUNTER — Other Ambulatory Visit: Payer: Self-pay | Admitting: Nurse Practitioner

## 2021-05-12 DIAGNOSIS — F339 Major depressive disorder, recurrent, unspecified: Secondary | ICD-10-CM

## 2021-05-12 DIAGNOSIS — F411 Generalized anxiety disorder: Secondary | ICD-10-CM

## 2021-05-17 ENCOUNTER — Other Ambulatory Visit: Payer: Self-pay | Admitting: Pain Medicine

## 2021-05-17 DIAGNOSIS — F1290 Cannabis use, unspecified, uncomplicated: Secondary | ICD-10-CM | POA: Insufficient documentation

## 2021-05-17 DIAGNOSIS — R892 Abnormal level of other drugs, medicaments and biological substances in specimens from other organs, systems and tissues: Secondary | ICD-10-CM

## 2021-06-01 ENCOUNTER — Other Ambulatory Visit: Payer: Self-pay

## 2021-06-01 ENCOUNTER — Ambulatory Visit (INDEPENDENT_AMBULATORY_CARE_PROVIDER_SITE_OTHER): Payer: BC Managed Care – PPO | Admitting: Nurse Practitioner

## 2021-06-01 VITALS — BP 118/80 | HR 75 | Ht 70.0 in | Wt 189.0 lb

## 2021-06-01 DIAGNOSIS — M5136 Other intervertebral disc degeneration, lumbar region: Secondary | ICD-10-CM | POA: Diagnosis not present

## 2021-06-01 DIAGNOSIS — K0889 Other specified disorders of teeth and supporting structures: Secondary | ICD-10-CM | POA: Diagnosis not present

## 2021-06-01 DIAGNOSIS — M5030 Other cervical disc degeneration, unspecified cervical region: Secondary | ICD-10-CM

## 2021-06-01 MED ORDER — HYDROCODONE-ACETAMINOPHEN 5-325 MG PO TABS: 1.0000 | ORAL_TABLET | Freq: Four times a day (QID) | ORAL | 0 refills | Status: DC | PRN

## 2021-06-01 MED ORDER — DULOXETINE HCL 60 MG PO CPEP: 60.0000 mg | ORAL_CAPSULE | Freq: Every day | ORAL | 0 refills | Status: DC

## 2021-06-01 NOTE — Assessment & Plan Note (Signed)
Chronic.  Thinks duloxetine is helping somewhat with chronic pain.  Will plan to increase to duloxetine 60 mg daily.  Patient to let us know about affordability of this medication with insurance and we discussed use of GoodRx card if his insurance does not cover.  PDMP reviewed and appropriate and refill given for Norco 5-325 every 6 hours as needed for chronic pain.  Pain management referral is pending.Will follow up in 1 month.

## 2021-06-01 NOTE — Assessment & Plan Note (Signed)
Chronic.  Thinks duloxetine is helping somewhat with chronic pain.  Will plan to increase to duloxetine 60 mg daily.  Patient to let us know about affordability of this medication with insurance and we discussed use of GoodRx card if his insurance does not cover.  PDMP reviewed and appropriate and refill given for Norco 5-325 every 6 hours as needed for chronic pain.  Pain management referral is pending.Will follow up in 1 month. 

## 2021-06-01 NOTE — Progress Notes (Signed)
Subjective:    Patient ID: Tyler Craig, male    DOB: 30-Jun-1971, 50 y.o.   MRN: [id number]  HPI: Tyler Craig is a 50 y.o. male presenting for medication follow up.   Chief Complaint  Patient presents with   Follow-up    2 month follow up , new medication working , his insurance will cover 40mg  tabs   CHRONIC PAIN Currently taking duloxetine 20 mg daily.  Tells me his insurance will not pay for the 40 mg tablets of Cymbalta so the pharmacy has been giving him 20 mg tablets instead.  He did take 40 mg for about 1 month and thinks this really helped his pain and he was starting to see improvement.  He was recently "let go" from his job and has been moving around trying to find permanent work.  Overall, his mood seems upbeat today.      Also taking hydrocodone-acetaminophen 5-325 2 tablets at night time.  He reports this takes the edge off of his pain.  He has been referred to pain management and had his initial visit.  He is trying to find time to schedule the testing so he can have a follow up visit and so they can discuss long term treatment.  The pain medication allows him to function and get some sleep.  Without the pain medication, he is in so much pain he cannot even sleep.  He also takes Celebrex daily and BC goody.  He is on omeprazole to protect his stomach and does take this daily.  He is requesting refills today.   He has also been having some dental pain.  He was recently on Amoxicillin for a while and tells me it gave him an itchy, red, rash on his chest.  He is having dental surgery in October.     Allergies  Allergen Reactions   Amoxicillin Rash    Outpatient Encounter Medications as of 06/01/2021  Medication Sig   atorvastatin (LIPITOR) 80 MG tablet Take 80mg  once a day   calcium carbonate (TUMS - DOSED IN MG ELEMENTAL CALCIUM) 500 MG chewable tablet Chew 3 tablets by mouth 2 (two) times daily as needed for indigestion or heartburn.   celecoxib (CELEBREX) 200 MG  capsule Take 200mg  once a day for inflammation   HYDROcodone-acetaminophen (NORCO) 5-325 MG tablet Take 1 tablet by mouth every 6 (six) hours as needed for moderate pain or severe pain. Dx: G89.4, G54.2   omeprazole (PRILOSEC) 40 MG capsule Take 1 capsule (40 mg total) by mouth daily as needed. TAKE 1 CAPSULE(40 MG) BY MOUTH DAILY FOR ABDOMINAL PAIN   [DISCONTINUED] DULoxetine (CYMBALTA) 20 MG capsule Take 1 capsule by mouth once daily   [DISCONTINUED] DULoxetine 40 MG CPEP Take 40 mg by mouth daily.   [DISCONTINUED] triamcinolone ointment (KENALOG) 0.1 % Apply 1 application topically 2 (two) times daily. Apply sparingly to affected areas.  Do not use for more than 2 weeks consistently, notify prescriber if this is not working after about 1 week.   No facility-administered encounter medications on file as of 06/01/2021.    Patient Active Problem List   Diagnosis Date Noted   Abnormal drug screen (03/15/2021) 05/17/2021   Marijuana use 05/17/2021   Chronic pain syndrome 03/15/2021   Pharmacologic therapy 03/15/2021   Disorder of skeletal system 03/15/2021   Problems influencing health status 03/15/2021   Abnormal MRI, lumbar spine (11/28/2016) 03/15/2021   Thoracic facet arthropathy 03/15/2021   Lumbosacral facet arthropathy 03/15/2021  Thoracic foraminal stenosis 03/15/2021   Lumbosacral foraminal stenosis 03/15/2021   Lumbosacral lateral recess stenosis 03/15/2021   Abnormal MRI, cervical spine (11/28/2016) 03/15/2021   Cervicalgia 03/15/2021   Chronic neck pain with history of cervical spinal surgery (1ry area of Pain) 03/15/2021   Chronic shoulder pain (Right) 03/15/2021   Chronic low back pain (Bilateral) w/o sciatica 03/15/2021   Chronic upper extremity pain (Bilateral) 03/15/2021   Chronic lower extremity pain (Left) 03/15/2021   Radicular pain of shoulder (Right) 03/15/2021   Chronic cervical radiculopathy (C6/C7) (Right) 03/15/2021   Chronic lumbar radiculitis (L5) (Left)  03/15/2021   Numbness and tingling of upper extremity (Left) 03/15/2021   Numbness and tingling of lower extremity (Left) 03/15/2021   Prediabetes 01/26/2021   Rash 12/27/2020   Nausea with vomiting 06/09/2020   GERD (gastroesophageal reflux disease) 09/30/2019   Abdominal pain 09/30/2019   Lumbar nerve root impingement (S1) (Right) 04/21/2018   History of cervical spinal surgery (C4-6 ACDF) 04/04/2017   Cervical spinal stenosis 02/04/2017   DDD (degenerative disc disease), cervical 12/12/2016   DDD (degenerative disc disease), lumbar 12/12/2016   Cervical nerve root impingement 12/12/2016   Benign paroxysmal positional vertigo 08/03/2016   Hyperlipidemia 11/16/2014   Insomnia 07/05/2014   Elbow pain 02/09/2014   MDD (major depressive disorder) 12/29/2013   GAD (generalized anxiety disorder) 12/29/2013   Smoker 12/29/2013    Past Medical History:  Diagnosis Date   Anxiety    takes Fluoxetine daily   Hyperlipidemia    takes Atorvastatin daily   Muscle spasm    takes Flexeril as needed   Numbness    left arm and left leg.Tingling as well   PONV (postoperative nausea and vomiting)     Relevant past medical, surgical, family and social history reviewed and updated as indicated. Interim medical history since our last visit reviewed.  Review of Systems Per HPI unless specifically indicated above     Objective:    BP 118/80 (BP Location: Left Arm, Patient Position: Sitting, Cuff Size: Normal)   Pulse 75   Ht 5\' 10"  (1.778 m)   Wt 189 lb (85.7 kg)   SpO2 97%   BMI 27.12 kg/m   Wt Readings from Last 3 Encounters:  06/01/21 189 lb (85.7 kg)  03/31/21 188 lb 9.6 oz (85.5 kg)  03/15/21 190 lb (86.2 kg)    Physical Exam Vitals and nursing note reviewed.  Constitutional:      General: He is not in acute distress.    Appearance: Normal appearance. He is not toxic-appearing.  Eyes:     General: No scleral icterus.    Extraocular Movements: Extraocular movements intact.   Cardiovascular:     Rate and Rhythm: Normal rate.  Pulmonary:     Effort: Pulmonary effort is normal. No respiratory distress.  Skin:    General: Skin is warm and dry.     Coloration: Skin is not jaundiced or pale.     Findings: No erythema.  Neurological:     Mental Status: He is alert and oriented to person, place, and time.     Motor: No weakness.     Gait: Gait normal.  Psychiatric:        Mood and Affect: Mood normal.        Behavior: Behavior normal.        Thought Content: Thought content normal.        Judgment: Judgment normal.      Assessment & Plan:   Problem List  Items Addressed This Visit       Musculoskeletal and Integument   DDD (degenerative disc disease), lumbar (Chronic)    Chronic.  Thinks duloxetine is helping somewhat with chronic pain.  Will plan to increase to duloxetine 60 mg daily.  Patient to let us know about affordability of this medication with insurance and we discussed use of GoodRx card if his insurance does not cover.  PDMP reviewed and appropriate and refill given for Norco 5-325 every 6 hours as needed for chronic pain.  Pain management referral is pending.Will follow up in 1 month.      DDD (degenerative disc disease), cervical (Chronic)    Chronic.  Thinks duloxetine is helping somewhat with chronic pain.  Will plan to increase to duloxetine 60 mg daily.  Patient to let us know about affordability of this medication with insurance and we discussed use of GoodRx card if his insurance does not cover.  PDMP reviewed and appropriate and refill given for Norco 5-325 every 6 hours as needed for chronic pain.  Pain management referral is pending.Will follow up in 1 month.      Other Visit Diagnoses     Pain, dental    -  Primary      Acute, improved with oral antibiotic.  Discussed that it sounds like patient had an allergic reaction to amoxicillin.  I marked this in his medical record.    Follow up plan: Return in about 4 weeks (around  06/29/2021) for pain follow up.

## 2021-06-28 ENCOUNTER — Other Ambulatory Visit: Payer: Self-pay | Admitting: *Deleted

## 2021-06-28 DIAGNOSIS — M5136 Other intervertebral disc degeneration, lumbar region: Secondary | ICD-10-CM

## 2021-06-28 DIAGNOSIS — M5030 Other cervical disc degeneration, unspecified cervical region: Secondary | ICD-10-CM

## 2021-06-28 DIAGNOSIS — M51369 Other intervertebral disc degeneration, lumbar region without mention of lumbar back pain or lower extremity pain: Secondary | ICD-10-CM

## 2021-06-28 NOTE — Telephone Encounter (Signed)
Received call from patient.   Requested to refill Hydrocodone/APAP.  Ok to refill??  Last office visit 06/01/2021.  Last refill 06/01/2021.

## 2021-06-29 MED ORDER — HYDROCODONE-ACETAMINOPHEN 5-325 MG PO TABS: 1.0000 | ORAL_TABLET | Freq: Four times a day (QID) | ORAL | 0 refills | Status: DC | PRN

## 2021-06-29 NOTE — Telephone Encounter (Signed)
PDMP reviewed and appropriate.  Patient has upcoming appointment with me next week.  Work up with pain management is pending.  Refill given.

## 2021-06-30 ENCOUNTER — Other Ambulatory Visit: Payer: Self-pay | Admitting: Nurse Practitioner

## 2021-06-30 DIAGNOSIS — M5136 Other intervertebral disc degeneration, lumbar region: Secondary | ICD-10-CM

## 2021-06-30 DIAGNOSIS — M5030 Other cervical disc degeneration, unspecified cervical region: Secondary | ICD-10-CM

## 2021-07-03 ENCOUNTER — Other Ambulatory Visit: Payer: Self-pay

## 2021-07-03 ENCOUNTER — Telehealth: Payer: Self-pay

## 2021-07-03 ENCOUNTER — Encounter: Payer: Self-pay | Admitting: Nurse Practitioner

## 2021-07-03 ENCOUNTER — Ambulatory Visit (INDEPENDENT_AMBULATORY_CARE_PROVIDER_SITE_OTHER): Payer: BC Managed Care – PPO | Admitting: Nurse Practitioner

## 2021-07-03 VITALS — BP 132/84 | HR 92 | Temp 98.0°F | Ht 70.0 in | Wt 183.0 lb

## 2021-07-03 DIAGNOSIS — M5030 Other cervical disc degeneration, unspecified cervical region: Secondary | ICD-10-CM

## 2021-07-03 DIAGNOSIS — M5136 Other intervertebral disc degeneration, lumbar region: Secondary | ICD-10-CM | POA: Diagnosis not present

## 2021-07-03 DIAGNOSIS — K921 Melena: Secondary | ICD-10-CM

## 2021-07-03 MED ORDER — DULOXETINE HCL 60 MG PO CPEP: 60.0000 mg | ORAL_CAPSULE | Freq: Every day | ORAL | 1 refills | Status: DC

## 2021-07-03 NOTE — Telephone Encounter (Signed)
Pt came in for appt, and wanted to inquiry about his bill. Pt paid half the almost half of the bill amount but wanted to be sure that what he previously paid has been taken off the current amount. Pt has [employer] for insurance. Please advise.   Cb#: [phone]

## 2021-07-03 NOTE — Assessment & Plan Note (Signed)
Chronic.  Stable with duloxetine 60 mg daily and Norco 5-325 every 6 hours prn chronic pain.  PDMP reviewed last week prior to last prescription.  Pain management referral is pending.  Follow up in 2 months.   

## 2021-07-03 NOTE — Assessment & Plan Note (Signed)
Chronic.  Stable with duloxetine 60 mg daily and Norco 5-325 every 6 hours prn chronic pain.  PDMP reviewed last week prior to last prescription.  Pain management referral is pending.  Follow up in 2 months.

## 2021-07-03 NOTE — Progress Notes (Signed)
Subjective:    Patient ID: Tyler Craig, male    DOB: 02-09-1971, 50 y.o.   MRN: [id number]  HPI: Tyler Craig is a 50 y.o. male presenting for follow up.  Chief Complaint  Patient presents with   Follow-up    1 month follow up no complaints    CHRONIC PAIN  Present dose:  Norco 5-325, takes 2 tablets at bedtime to help him sleep from chronic pain.  Also taking duloxetine 60 mg daily. Does not think this is hurting. Pain control status: stable Duration: chronic Benefit from narcotic medications: yes What Activities task can be accomplished with current medication?: sleep, ADLs, working Interested in weaning off narcotics:no   Stool softners/OTC fiber: no  Previous pain specialty evaluation: yes Non-narcotic analgesic meds: yes  Patient recently had dental surgery.  He was given ibuprofen to help with the pain and although it did help, it made his stools tarry.  He reports on and off rectal bleeding and has declined colon cancer screening in the past.  He wants to wait until he is 50 years old.   Reports he has had a very rough month with his job and also with his family.  Allergies  Allergen Reactions   Amoxicillin Rash    Outpatient Encounter Medications as of 07/03/2021  Medication Sig   atorvastatin (LIPITOR) 80 MG tablet Take 80mg  once a day   calcium carbonate (TUMS - DOSED IN MG ELEMENTAL CALCIUM) 500 MG chewable tablet Chew 3 tablets by mouth 2 (two) times daily as needed for indigestion or heartburn.   celecoxib (CELEBREX) 200 MG capsule Take 200mg  once a day for inflammation   HYDROcodone-acetaminophen (NORCO) 5-325 MG tablet Take 1 tablet by mouth every 6 (six) hours as needed for moderate pain or severe pain. Dx: G89.4, G54.2   omeprazole (PRILOSEC) 40 MG capsule Take 1 capsule (40 mg total) by mouth daily as needed. TAKE 1 CAPSULE(40 MG) BY MOUTH DAILY FOR ABDOMINAL PAIN   [DISCONTINUED] DULoxetine (CYMBALTA) 60 MG capsule Take 1 capsule by mouth once  daily   DULoxetine (CYMBALTA) 60 MG capsule Take 1 capsule (60 mg total) by mouth daily.   No facility-administered encounter medications on file as of 07/03/2021.    Patient Active Problem List   Diagnosis Date Noted   Abnormal drug screen (03/15/2021) 05/17/2021   Marijuana use 05/17/2021   Chronic pain syndrome 03/15/2021   Pharmacologic therapy 03/15/2021   Disorder of skeletal system 03/15/2021   Problems influencing health status 03/15/2021   Abnormal MRI, lumbar spine (11/28/2016) 03/15/2021   Thoracic facet arthropathy 03/15/2021   Lumbosacral facet arthropathy 03/15/2021   Thoracic foraminal stenosis 03/15/2021   Lumbosacral foraminal stenosis 03/15/2021   Lumbosacral lateral recess stenosis 03/15/2021   Abnormal MRI, cervical spine (11/28/2016) 03/15/2021   Cervicalgia 03/15/2021   Chronic neck pain with history of cervical spinal surgery (1ry area of Pain) 03/15/2021   Chronic shoulder pain (Right) 03/15/2021   Chronic low back pain (Bilateral) w/o sciatica 03/15/2021   Chronic upper extremity pain (Bilateral) 03/15/2021   Chronic lower extremity pain (Left) 03/15/2021   Radicular pain of shoulder (Right) 03/15/2021   Chronic cervical radiculopathy (C6/C7) (Right) 03/15/2021   Chronic lumbar radiculitis (L5) (Left) 03/15/2021   Numbness and tingling of upper extremity (Left) 03/15/2021   Numbness and tingling of lower extremity (Left) 03/15/2021   Prediabetes 01/26/2021   Rash 12/27/2020   Nausea with vomiting 06/09/2020   GERD (gastroesophageal reflux disease) 09/30/2019   Abdominal  pain 09/30/2019   Lumbar nerve root impingement (S1) (Right) 04/21/2018   History of cervical spinal surgery (C4-6 ACDF) 04/04/2017   Cervical spinal stenosis 02/04/2017   DDD (degenerative disc disease), cervical 12/12/2016   DDD (degenerative disc disease), lumbar 12/12/2016   Cervical nerve root impingement 12/12/2016   Benign paroxysmal positional vertigo 08/03/2016    Hyperlipidemia 11/16/2014   Insomnia 07/05/2014   Elbow pain 02/09/2014   MDD (major depressive disorder) 12/29/2013   GAD (generalized anxiety disorder) 12/29/2013   Smoker 12/29/2013    Past Medical History:  Diagnosis Date   Anxiety    takes Fluoxetine daily   Hyperlipidemia    takes Atorvastatin daily   Muscle spasm    takes Flexeril as needed   Numbness    left arm and left leg.Tingling as well   PONV (postoperative nausea and vomiting)     Relevant past medical, surgical, family and social history reviewed and updated as indicated. Interim medical history since our last visit reviewed.  Review of Systems Per HPI unless specifically indicated above     Objective:    BP 132/84 (BP Location: Left Arm, Cuff Size: Normal)   Pulse 92   Temp 98 F (36.7 C)   Ht 5\' 10"  (1.778 m)   Wt 183 lb (83 kg)   SpO2 98%   BMI 26.26 kg/m   Wt Readings from Last 3 Encounters:  07/03/21 183 lb (83 kg)  06/01/21 189 lb (85.7 kg)  03/31/21 188 lb 9.6 oz (85.5 kg)    Physical Exam Vitals and nursing note reviewed.  Constitutional:      General: He is not in acute distress.    Appearance: Normal appearance. He is not toxic-appearing.  Eyes:     General: No scleral icterus.    Extraocular Movements: Extraocular movements intact.  Cardiovascular:     Rate and Rhythm: Normal rate.  Pulmonary:     Effort: Pulmonary effort is normal. No respiratory distress.  Skin:    General: Skin is warm and dry.     Coloration: Skin is not jaundiced or pale.     Findings: No erythema.  Neurological:     Mental Status: He is alert and oriented to person, place, and time.     Motor: No weakness.     Gait: Gait normal.  Psychiatric:        Mood and Affect: Mood normal.        Behavior: Behavior normal.        Thought Content: Thought content normal.        Judgment: Judgment normal.      Assessment & Plan:   Problem List Items Addressed This Visit       Musculoskeletal and Integument    DDD (degenerative disc disease), lumbar (Chronic)    Chronic.  Stable with duloxetine 60 mg daily and Norco 5-325 every 6 hours prn chronic pain.  PDMP reviewed last week prior to last prescription.  Pain management referral is pending.  Follow up in 2 months.        Relevant Medications   DULoxetine (CYMBALTA) 60 MG capsule   DDD (degenerative disc disease), cervical (Chronic)    Chronic.  Stable with duloxetine 60 mg daily and Norco 5-325 every 6 hours prn chronic pain.  PDMP reviewed last week prior to last prescription.  Pain management referral is pending.  Follow up in 2 months.       Relevant Medications   DULoxetine (CYMBALTA) 60 MG capsule  Other Visit Diagnoses     Black tarry stools    -  Primary   Strongly encouraged GI referral/screening for colon cancer; patient adamently declines.  Wants to address '1 problem at a time."        Follow up plan: Return in about 2 months (around 09/02/2021) for chronic pain f/u .

## 2021-07-04 ENCOUNTER — Encounter: Payer: Self-pay | Admitting: Nurse Practitioner

## 2021-07-18 NOTE — Telephone Encounter (Signed)
I have looked at patient's account. It looks like he paid $250 on 07/03/21 which went to [facility] 12/27/20 and 01/26/21. He currently still owes $383.09 which is coming from [facility] 07/03/21 $134.28, 06/01/21$ 134.28, and 03/31/21 $114.53 they are all deductible amounts. I have left vm asking pt to return my call so I can advise him of this information.

## 2021-08-02 ENCOUNTER — Other Ambulatory Visit: Payer: Self-pay

## 2021-08-02 DIAGNOSIS — M5136 Other intervertebral disc degeneration, lumbar region: Secondary | ICD-10-CM

## 2021-08-02 DIAGNOSIS — M5030 Other cervical disc degeneration, unspecified cervical region: Secondary | ICD-10-CM

## 2021-08-02 NOTE — Telephone Encounter (Signed)
Pt called in requesting a refill of HYDROcodone-acetaminophen.  Cb#: [phone]

## 2021-08-02 NOTE — Telephone Encounter (Signed)
LOV 07/03/21 Last refill 07/02/21, #60, 0 refills  Please review, thanks!

## 2021-08-03 ENCOUNTER — Other Ambulatory Visit: Payer: Self-pay

## 2021-08-03 ENCOUNTER — Telehealth: Payer: Self-pay

## 2021-08-03 DIAGNOSIS — M51369 Other intervertebral disc degeneration, lumbar region without mention of lumbar back pain or lower extremity pain: Secondary | ICD-10-CM

## 2021-08-03 DIAGNOSIS — M5136 Other intervertebral disc degeneration, lumbar region: Secondary | ICD-10-CM

## 2021-08-03 DIAGNOSIS — M5030 Other cervical disc degeneration, unspecified cervical region: Secondary | ICD-10-CM

## 2021-08-03 MED ORDER — CELECOXIB 200 MG PO CAPS: ORAL_CAPSULE | ORAL | 1 refills | Status: DC

## 2021-08-03 MED ORDER — HYDROCODONE-ACETAMINOPHEN 5-325 MG PO TABS
1.0000 | ORAL_TABLET | Freq: Four times a day (QID) | ORAL | 0 refills | Status: DC | PRN
Start: 2021-08-03 — End: 2021-09-04

## 2021-08-03 NOTE — Telephone Encounter (Signed)
PDMP reviewed and appropriate.  Patient has appointment scheduled next month.

## 2021-08-03 NOTE — Telephone Encounter (Signed)
Pt called in stating that he needed a refill prescription of celecoxib (CELEBREX) 200 MG  sent to [facility] pharmacy.  Cb#:  [phone]

## 2021-08-03 NOTE — Telephone Encounter (Signed)
Refills sent

## 2021-08-31 ENCOUNTER — Telehealth: Payer: Self-pay

## 2021-08-31 ENCOUNTER — Telehealth: Payer: Self-pay | Admitting: Nurse Practitioner

## 2021-08-31 NOTE — Telephone Encounter (Signed)
Patient has upcoming appointment - will discuss then

## 2021-08-31 NOTE — Telephone Encounter (Signed)
Erroneous encounter. Please disregard.

## 2021-08-31 NOTE — Telephone Encounter (Signed)
Pt called in requesting a refill of HYDROcodone-acetaminophen (NORCO) 5-325 MG tablet .   Cb#: [phone]

## 2021-08-31 NOTE — Telephone Encounter (Signed)
Advised patient hydrocodone rx would be discussed at upcoming appointment.

## 2021-09-04 ENCOUNTER — Ambulatory Visit (INDEPENDENT_AMBULATORY_CARE_PROVIDER_SITE_OTHER): Payer: BC Managed Care – PPO | Admitting: Nurse Practitioner

## 2021-09-04 ENCOUNTER — Encounter: Payer: Self-pay | Admitting: Nurse Practitioner

## 2021-09-04 ENCOUNTER — Other Ambulatory Visit: Payer: Self-pay

## 2021-09-04 VITALS — BP 118/72 | HR 104 | Ht 70.0 in | Wt 182.0 lb

## 2021-09-04 DIAGNOSIS — R7303 Prediabetes: Secondary | ICD-10-CM | POA: Diagnosis not present

## 2021-09-04 DIAGNOSIS — M5136 Other intervertebral disc degeneration, lumbar region: Secondary | ICD-10-CM

## 2021-09-04 DIAGNOSIS — M5030 Other cervical disc degeneration, unspecified cervical region: Secondary | ICD-10-CM | POA: Diagnosis not present

## 2021-09-04 DIAGNOSIS — E782 Mixed hyperlipidemia: Secondary | ICD-10-CM

## 2021-09-04 DIAGNOSIS — Z23 Encounter for immunization: Secondary | ICD-10-CM

## 2021-09-04 MED ORDER — HYDROCODONE-ACETAMINOPHEN 5-325 MG PO TABS: 1.0000 | ORAL_TABLET | Freq: Four times a day (QID) | ORAL | 0 refills | Status: DC | PRN

## 2021-09-04 NOTE — Assessment & Plan Note (Signed)
Chronic.  Stable with duloxetine 60 mg daily and Norco 5-325 #60 every month.  PDMP reviewed and appropriate and refill given.  Pain management referral is pending.  Follow up 2 months.

## 2021-09-04 NOTE — Assessment & Plan Note (Signed)
Chronic.  Last A1c improved to 5.7% about 6 months ago.  Recheck HgbA1c today.  Continue dietary and lifestyle changes and limiting intake of simple sugars.  Follow up 6 months.

## 2021-09-04 NOTE — Assessment & Plan Note (Signed)
Chronic.  LDL goal less than 100.  Will check lipids today, however patient is not fasting.  Continue atorvastatin 80 mg daily for now.  Follow up 6 months.

## 2021-09-04 NOTE — Progress Notes (Signed)
Subjective:    Patient ID: Tyler Craig, male    DOB: 08-Jul-1971, 50 y.o.   MRN: [id number]  HPI: Tyler Craig is a 50 y.o. male presenting for follow up.   Chief Complaint  Patient presents with   Follow-up    CHRONIC PAIN  Currently taking hydrocodone-acetaminophen 5-325 mg 1 tablet twice daily.  This helps him function day to day.  Also taking duloxetine 60 mg daily.  Reports this is helping more than the Prozac did and he does think this is helping some with the pain.  Patient reports he is not financially ready to have further testing/imaging for neck or shoulders.  He would like to continue his current medication regimen for now. Pain control status: stable Duration: chronic Location: neck, right shoulder/arm Quality: aching Current Pain Level: severe Previous Pain Level: severe Breakthrough pain: yes Benefit from narcotic medications: yes What Activities task can be accomplished with current medication?: working, functioning, sleeping Interested in weaning off narcotics:no   Stool softners/OTC fiber: yes  Previous pain specialty evaluation: yes - pending Non-narcotic analgesic meds: yes Narcotic contract: no; he is aware I will not manage this long term  HYPERLIPIDEMIA Tolerating atorvastatin 80 mg daily well.   Hyperlipidemia status: stable Satisfied with current treatment?  yes Side effects:  no Medication compliance: excellent compliance Aspirin:  no The 10-year ASCVD risk score (Arnett DK, et al., 2019) is: 7.1%   Values used to calculate the score:     Age: 32 years     Sex: Male     Is Non-Hispanic African American: No     Diabetic: No     Tobacco smoker: Yes     Systolic Blood Pressure: 118 mmHg     Is BP treated: No     HDL Cholesterol: 35 mg/dL     Total Cholesterol: 168 mg/dL Chest pain:  no Myalgias: no LDL goal: less than 100  Of note, he is still smoking.  He is going through a tough time in his marriage right now which has made it  tough for him emotionally.    He denies any further black/tarry stools since stopping ibuprofen.   Allergies  Allergen Reactions   Amoxicillin Rash    Outpatient Encounter Medications as of 09/04/2021  Medication Sig   atorvastatin (LIPITOR) 80 MG tablet Take 80mg  once a day   calcium carbonate (TUMS - DOSED IN MG ELEMENTAL CALCIUM) 500 MG chewable tablet Chew 3 tablets by mouth 2 (two) times daily as needed for indigestion or heartburn.   celecoxib (CELEBREX) 200 MG capsule Take 200mg  once a day for inflammation   DULoxetine (CYMBALTA) 60 MG capsule Take 1 capsule (60 mg total) by mouth daily.   omeprazole (PRILOSEC) 40 MG capsule Take 1 capsule (40 mg total) by mouth daily as needed. TAKE 1 CAPSULE(40 MG) BY MOUTH DAILY FOR ABDOMINAL PAIN   [DISCONTINUED] HYDROcodone-acetaminophen (NORCO) 5-325 MG tablet Take 1 tablet by mouth every 6 (six) hours as needed for moderate pain or severe pain. Dx: G89.4, G54.2   HYDROcodone-acetaminophen (NORCO) 5-325 MG tablet Take 1 tablet by mouth every 6 (six) hours as needed for moderate pain or severe pain. Dx: G89.4, G54.2   No facility-administered encounter medications on file as of 09/04/2021.    Patient Active Problem List   Diagnosis Date Noted   Abnormal drug screen (03/15/2021) 05/17/2021   Marijuana use 05/17/2021   Chronic pain syndrome 03/15/2021   Pharmacologic therapy 03/15/2021   Disorder of  skeletal system 03/15/2021   Problems influencing health status 03/15/2021   Abnormal MRI, lumbar spine (11/28/2016) 03/15/2021   Thoracic facet arthropathy 03/15/2021   Lumbosacral facet arthropathy 03/15/2021   Thoracic foraminal stenosis 03/15/2021   Lumbosacral foraminal stenosis 03/15/2021   Lumbosacral lateral recess stenosis 03/15/2021   Abnormal MRI, cervical spine (11/28/2016) 03/15/2021   Cervicalgia 03/15/2021   Chronic neck pain with history of cervical spinal surgery (1ry area of Pain) 03/15/2021   Chronic shoulder pain (Right)  03/15/2021   Chronic low back pain (Bilateral) w/o sciatica 03/15/2021   Chronic upper extremity pain (Bilateral) 03/15/2021   Chronic lower extremity pain (Left) 03/15/2021   Radicular pain of shoulder (Right) 03/15/2021   Chronic cervical radiculopathy (C6/C7) (Right) 03/15/2021   Chronic lumbar radiculitis (L5) (Left) 03/15/2021   Numbness and tingling of upper extremity (Left) 03/15/2021   Numbness and tingling of lower extremity (Left) 03/15/2021   Prediabetes 01/26/2021   Rash 12/27/2020   Nausea with vomiting 06/09/2020   GERD (gastroesophageal reflux disease) 09/30/2019   Abdominal pain 09/30/2019   Lumbar nerve root impingement (S1) (Right) 04/21/2018   History of cervical spinal surgery (C4-6 ACDF) 04/04/2017   Cervical spinal stenosis 02/04/2017   DDD (degenerative disc disease), cervical 12/12/2016   DDD (degenerative disc disease), lumbar 12/12/2016   Cervical nerve root impingement 12/12/2016   Benign paroxysmal positional vertigo 08/03/2016   Hyperlipidemia 11/16/2014   Insomnia 07/05/2014   Elbow pain 02/09/2014   MDD (major depressive disorder) 12/29/2013   GAD (generalized anxiety disorder) 12/29/2013   Smoker 12/29/2013    Past Medical History:  Diagnosis Date   Anxiety    takes Fluoxetine daily   Hyperlipidemia    takes Atorvastatin daily   Muscle spasm    takes Flexeril as needed   Numbness    left arm and left leg.Tingling as well   PONV (postoperative nausea and vomiting)     Relevant past medical, surgical, family and social history reviewed and updated as indicated. Interim medical history since our last visit reviewed.  Review of Systems Per HPI unless specifically indicated above     Objective:    BP 118/72    Pulse (!) 104    Ht 5\' 10"  (1.778 m)    Wt 182 lb (82.6 kg)    SpO2 97%    BMI 26.11 kg/m   Wt Readings from Last 3 Encounters:  09/04/21 182 lb (82.6 kg)  07/03/21 183 lb (83 kg)  06/01/21 189 lb (85.7 kg)    Physical  Exam Vitals and nursing note reviewed.  Constitutional:      General: He is not in acute distress.    Appearance: Normal appearance. He is not toxic-appearing.  HENT:     Right Ear: External ear normal.     Left Ear: External ear normal.  Neck:     Vascular: No carotid bruit.  Cardiovascular:     Rate and Rhythm: Normal rate and regular rhythm.     Heart sounds: Normal heart sounds. No murmur heard. Pulmonary:     Effort: Pulmonary effort is normal. No respiratory distress.     Breath sounds: Normal breath sounds. No wheezing, rhonchi or rales.  Musculoskeletal:     Cervical back: Normal range of motion.  Skin:    General: Skin is warm and dry.     Coloration: Skin is not jaundiced or pale.     Findings: No erythema.  Neurological:     Mental Status: He is alert and  oriented to person, place, and time.  Psychiatric:        Attention and Perception: Attention normal.        Mood and Affect: Affect is flat and tearful.        Speech: Speech normal.        Behavior: Behavior normal. Behavior is cooperative.      Assessment & Plan:   Problem List Items Addressed This Visit       Musculoskeletal and Integument   DDD (degenerative disc disease), lumbar (Chronic)    Chronic.  Stable with duloxetine 60 mg daily and Norco 5-325 #60 every month.  PDMP reviewed and appropriate and refill given.  Pain management referral is pending.  Follow up 2 months.      Relevant Medications   HYDROcodone-acetaminophen (NORCO) 5-325 MG tablet   DDD (degenerative disc disease), cervical (Chronic)    Chronic.  Stable with duloxetine 60 mg daily and Norco 5-325 #60 every month.  PDMP reviewed and appropriate and refill given.  Pain management referral is pending.  Follow up 2 months.      Relevant Medications   HYDROcodone-acetaminophen (NORCO) 5-325 MG tablet     Other   Prediabetes - Primary    Chronic.  Last A1c improved to 5.7% about 6 months ago.  Recheck HgbA1c today.  Continue dietary  and lifestyle changes and limiting intake of simple sugars.  Follow up 6 months.        Relevant Orders   Hemoglobin A1c   CBC with Differential/Platelet   Hyperlipidemia    Chronic.  LDL goal less than 100.  Will check lipids today, however patient is not fasting.  Continue atorvastatin 80 mg daily for now.  Follow up 6 months.      Relevant Orders   Lipid panel   COMPLETE METABOLIC PANEL WITH GFR   Other Visit Diagnoses     Need for immunization against influenza       Relevant Orders   Flu Vaccine QUAD 43mo+IM (Fluarix, Fluzone & Alfiuria Quad PF) (Completed)        Follow up plan: Return in about 2 months (around 11/05/2021) for pain follow up.

## 2021-09-05 ENCOUNTER — Telehealth: Payer: Self-pay | Admitting: Nurse Practitioner

## 2021-09-05 LAB — COMPLETE METABOLIC PANEL WITH GFR
AG Ratio: 1.8 (calc) (ref 1.0–2.5)
ALT: 15 U/L (ref 9–46)
AST: 13 U/L (ref 10–40)
Albumin: 4.6 g/dL (ref 3.6–5.1)
Alkaline phosphatase (APISO): 70 U/L (ref 36–130)
BUN: 15 mg/dL (ref 7–25)
CO2: 26 mmol/L (ref 20–32)
Calcium: 9.7 mg/dL (ref 8.6–10.3)
Chloride: 106 mmol/L (ref 98–110)
Creat: 1 mg/dL (ref 0.60–1.29)
Globulin: 2.6 g/dL (calc) (ref 1.9–3.7)
Glucose, Bld: 105 mg/dL — ABNORMAL HIGH (ref 65–99)
Potassium: 5.1 mmol/L (ref 3.5–5.3)
Sodium: 141 mmol/L (ref 135–146)
Total Bilirubin: 0.5 mg/dL (ref 0.2–1.2)
Total Protein: 7.2 g/dL (ref 6.1–8.1)
eGFR: 92 mL/min/{1.73_m2} (ref >=60)

## 2021-09-05 LAB — CBC WITH DIFFERENTIAL/PLATELET
Absolute Monocytes: 689 cells/uL (ref 200–950)
Basophils Absolute: 58 cells/uL (ref 0–200)
Basophils Relative: 0.6 %
Eosinophils Absolute: 243 cells/uL (ref 15–500)
Eosinophils Relative: 2.5 %
HCT: 40.6 % (ref 38.5–50.0)
Hemoglobin: 13.9 g/dL (ref 13.2–17.1)
Lymphs Abs: 3230 cells/uL (ref 850–3900)
MCH: 32.3 pg (ref 27.0–33.0)
MCHC: 34.2 g/dL (ref 32.0–36.0)
MCV: 94.2 fL (ref 80.0–100.0)
MPV: 11.3 fL (ref 7.5–12.5)
Monocytes Relative: 7.1 %
Neutro Abs: 5481 cells/uL (ref 1500–7800)
Neutrophils Relative %: 56.5 %
Platelets: 318 10*3/uL (ref 140–400)
RBC: 4.31 10*6/uL (ref 4.20–5.80)
RDW: 13.7 % (ref 11.0–15.0)
Total Lymphocyte: 33.3 %
WBC: 9.7 10*3/uL (ref 3.8–10.8)

## 2021-09-05 LAB — LIPID PANEL
Cholesterol: 158 mg/dL (ref <200)
HDL: 34 mg/dL — ABNORMAL LOW (ref >=40)
LDL Cholesterol (Calc): 101 mg/dL (calc) — ABNORMAL HIGH
Non-HDL Cholesterol (Calc): 124 mg/dL (calc) (ref <130)
Total CHOL/HDL Ratio: 4.6 (calc) (ref <5.0)
Triglycerides: 124 mg/dL (ref <150)

## 2021-09-05 LAB — HEMOGLOBIN A1C
Hgb A1c MFr Bld: 5.7 % of total Hgb — ABNORMAL HIGH (ref <5.7)
Mean Plasma Glucose: 117 mg/dL
eAG (mmol/L): 6.5 mmol/L

## 2021-09-05 NOTE — Telephone Encounter (Signed)
Patient came to office to request Rx for HYDROcodone-acetaminophen [facility]) 5-325 MG tablet [[id number]]  be sent to [facility] on [address] in [place]; pharmacy Rx was sent to, [id number] [place] doesn't have any in stock.  Please advise at [phone].

## 2021-09-05 NOTE — Telephone Encounter (Signed)
Patient called yesterday but provider was already gone for the day.  Please advise.

## 2021-09-06 ENCOUNTER — Other Ambulatory Visit: Payer: Self-pay | Admitting: Nurse Practitioner

## 2021-09-06 DIAGNOSIS — M5030 Other cervical disc degeneration, unspecified cervical region: Secondary | ICD-10-CM

## 2021-09-06 DIAGNOSIS — M5136 Other intervertebral disc degeneration, lumbar region: Secondary | ICD-10-CM

## 2021-09-06 MED ORDER — HYDROCODONE-ACETAMINOPHEN 5-325 MG PO TABS: 1.0000 | ORAL_TABLET | Freq: Four times a day (QID) | ORAL | 0 refills | Status: DC | PRN

## 2021-09-06 NOTE — Telephone Encounter (Signed)
Canceled rx at [facility] and informed patient new rx was sent to [facility].

## 2021-09-06 NOTE — Telephone Encounter (Signed)
I sent medication to [facility] on [address].  Please cancel prescription at [facility].

## 2021-10-06 ENCOUNTER — Other Ambulatory Visit: Payer: Self-pay | Admitting: Nurse Practitioner

## 2021-10-06 DIAGNOSIS — M5136 Other intervertebral disc degeneration, lumbar region: Secondary | ICD-10-CM

## 2021-10-06 DIAGNOSIS — M5030 Other cervical disc degeneration, unspecified cervical region: Secondary | ICD-10-CM

## 2021-10-06 MED ORDER — HYDROCODONE-ACETAMINOPHEN 5-325 MG PO TABS: 1.0000 | ORAL_TABLET | Freq: Four times a day (QID) | ORAL | 0 refills | Status: AC | PRN

## 2021-10-06 NOTE — Telephone Encounter (Signed)
Patient called to request refill of  HYDROcodone-acetaminophen (NORCO) 5-325 MG tablet [[id number]]   Pharmacy confirmed as   [id number] - [place], [place] - [address]  [address], [place] [address]  Phone:  [phone]  Fax:  [phone]  DEA #:  [id number]  Please advise at [phone].

## 2021-10-06 NOTE — Telephone Encounter (Signed)
PDMP reviewed and appropriate.  Pain management referral is pending.

## 2021-10-06 NOTE — Telephone Encounter (Signed)
Hydrocodone refill request.  Last seen 09/04/2021, last filled 09/06/2021.

## 2021-10-19 ENCOUNTER — Ambulatory Visit (INDEPENDENT_AMBULATORY_CARE_PROVIDER_SITE_OTHER): Payer: BC Managed Care – PPO | Admitting: Nurse Practitioner

## 2021-10-19 ENCOUNTER — Encounter: Payer: Self-pay | Admitting: Nurse Practitioner

## 2021-10-19 ENCOUNTER — Other Ambulatory Visit: Payer: Self-pay

## 2021-10-19 DIAGNOSIS — M5136 Other intervertebral disc degeneration, lumbar region: Secondary | ICD-10-CM

## 2021-10-19 DIAGNOSIS — K219 Gastro-esophageal reflux disease without esophagitis: Secondary | ICD-10-CM

## 2021-10-19 DIAGNOSIS — M5030 Other cervical disc degeneration, unspecified cervical region: Secondary | ICD-10-CM | POA: Diagnosis not present

## 2021-10-19 DIAGNOSIS — E782 Mixed hyperlipidemia: Secondary | ICD-10-CM

## 2021-10-19 MED ORDER — DULOXETINE HCL 60 MG PO CPEP: 60.0000 mg | ORAL_CAPSULE | Freq: Every day | ORAL | 1 refills | Status: AC

## 2021-10-19 MED ORDER — OMEPRAZOLE 40 MG PO CPDR: 40.0000 mg | DELAYED_RELEASE_CAPSULE | Freq: Every day | ORAL | 1 refills | Status: AC | PRN

## 2021-10-19 MED ORDER — ATORVASTATIN CALCIUM 80 MG PO TABS: ORAL_TABLET | ORAL | 1 refills | Status: AC

## 2021-10-19 MED ORDER — CELECOXIB 200 MG PO CAPS: ORAL_CAPSULE | ORAL | 1 refills | Status: DC

## 2021-10-19 NOTE — Progress Notes (Signed)
Subjective:    Patient ID: Tyler Craig, male    DOB: 20-Feb-1971, 51 y.o.   MRN: [id number]  HPI: Tyler Craig is a 51 y.o. male presenting for medication refill.  No chief complaint on file.  Patient is here today requesting refills of medication.  He is taking atorvastatin 80 mg daily.  LDL goal less than 100.  He admits missing some of these pills prior to last lab draw.  He is taking omeprazole 40 mg once daily for acid reflux.  Reports symptoms are well controlled.  He is taking duloxetine 60 mg daily for mood and chronic pain.  Declines filling out PHQ-9 and GAD-7 today.  Reports his mood and pain are stable.  Also takes celebrex 200 mg daily and Norco 5-325 twice daily for pain.  His pain management referral is pending.   Also asks about medication to help him sleep better.  Has been feeling more anxious at night time.  Has tried melatonin.   Allergies  Allergen Reactions   Amoxicillin Rash    Outpatient Encounter Medications as of 10/19/2021  Medication Sig   calcium carbonate (TUMS - DOSED IN MG ELEMENTAL CALCIUM) 500 MG chewable tablet Chew 3 tablets by mouth 2 (two) times daily as needed for indigestion or heartburn.   HYDROcodone-acetaminophen (NORCO) 5-325 MG tablet Take 1 tablet by mouth every 6 (six) hours as needed for moderate pain or severe pain. Dx: G89.4, G54.2   [DISCONTINUED] atorvastatin (LIPITOR) 80 MG tablet Take 80mg  once a day   [DISCONTINUED] celecoxib (CELEBREX) 200 MG capsule Take 200mg  once a day for inflammation   [DISCONTINUED] DULoxetine (CYMBALTA) 60 MG capsule Take 1 capsule (60 mg total) by mouth daily.   [DISCONTINUED] omeprazole (PRILOSEC) 40 MG capsule Take 1 capsule (40 mg total) by mouth daily as needed. TAKE 1 CAPSULE(40 MG) BY MOUTH DAILY FOR ABDOMINAL PAIN   atorvastatin (LIPITOR) 80 MG tablet Take 80mg  once a day   celecoxib (CELEBREX) 200 MG capsule Take 200mg  once a day for inflammation   DULoxetine (CYMBALTA) 60 MG capsule  Take 1 capsule (60 mg total) by mouth daily.   omeprazole (PRILOSEC) 40 MG capsule Take 1 capsule (40 mg total) by mouth daily as needed. TAKE 1 CAPSULE(40 MG) BY MOUTH DAILY FOR ABDOMINAL PAIN   No facility-administered encounter medications on file as of 10/19/2021.    Patient Active Problem List   Diagnosis Date Noted   Abnormal drug screen (03/15/2021) 05/17/2021   Marijuana use 05/17/2021   Chronic pain syndrome 03/15/2021   Pharmacologic therapy 03/15/2021   Disorder of skeletal system 03/15/2021   Problems influencing health status 03/15/2021   Abnormal MRI, lumbar spine (11/28/2016) 03/15/2021   Thoracic facet arthropathy 03/15/2021   Lumbosacral facet arthropathy 03/15/2021   Thoracic foraminal stenosis 03/15/2021   Lumbosacral foraminal stenosis 03/15/2021   Lumbosacral lateral recess stenosis 03/15/2021   Abnormal MRI, cervical spine (11/28/2016) 03/15/2021   Cervicalgia 03/15/2021   Chronic neck pain with history of cervical spinal surgery (1ry area of Pain) 03/15/2021   Chronic shoulder pain (Right) 03/15/2021   Chronic low back pain (Bilateral) w/o sciatica 03/15/2021   Chronic upper extremity pain (Bilateral) 03/15/2021   Chronic lower extremity pain (Left) 03/15/2021   Radicular pain of shoulder (Right) 03/15/2021   Chronic cervical radiculopathy (C6/C7) (Right) 03/15/2021   Chronic lumbar radiculitis (L5) (Left) 03/15/2021   Numbness and tingling of upper extremity (Left) 03/15/2021   Numbness and tingling of lower extremity (Left) 03/15/2021  Prediabetes 01/26/2021   Rash 12/27/2020   Nausea with vomiting 06/09/2020   GERD (gastroesophageal reflux disease) 09/30/2019   Abdominal pain 09/30/2019   Lumbar nerve root impingement (S1) (Right) 04/21/2018   History of cervical spinal surgery (C4-6 ACDF) 04/04/2017   Cervical spinal stenosis 02/04/2017   DDD (degenerative disc disease), cervical 12/12/2016   DDD (degenerative disc disease), lumbar 12/12/2016    Cervical nerve root impingement 12/12/2016   Benign paroxysmal positional vertigo 08/03/2016   Hyperlipidemia 11/16/2014   Insomnia 07/05/2014   Elbow pain 02/09/2014   MDD (major depressive disorder) 12/29/2013   GAD (generalized anxiety disorder) 12/29/2013   Smoker 12/29/2013    Past Medical History:  Diagnosis Date   Anxiety    takes Fluoxetine daily   Hyperlipidemia    takes Atorvastatin daily   Muscle spasm    takes Flexeril as needed   Numbness    left arm and left leg.Tingling as well   PONV (postoperative nausea and vomiting)     Relevant past medical, surgical, family and social history reviewed and updated as indicated. Interim medical history since our last visit reviewed.  Review of Systems  Constitutional: Negative.   HENT: Negative.  Negative for trouble swallowing.   Respiratory: Negative.    Cardiovascular: Negative.   Gastrointestinal: Negative.  Negative for abdominal distention, abdominal pain, diarrhea, nausea and vomiting.  Musculoskeletal:  Positive for arthralgias (chronic) and back pain (chronic).  Skin: Negative.   Neurological: Negative.   Psychiatric/Behavioral:  Positive for sleep disturbance. The patient is nervous/anxious.   Per HPI unless specifically indicated above     Objective:    BP 136/84    Pulse 100    Ht 5\' 10"  (1.778 m)    Wt 184 lb (83.5 kg)    SpO2 96%    BMI 26.40 kg/m   Wt Readings from Last 3 Encounters:  10/19/21 184 lb (83.5 kg)  09/04/21 182 lb (82.6 kg)  07/03/21 183 lb (83 kg)    Physical Exam Vitals and nursing note reviewed.  Constitutional:      General: He is not in acute distress.    Appearance: Normal appearance. He is not toxic-appearing.  Skin:    Coloration: Skin is not jaundiced or pale.     Findings: No erythema.  Neurological:     Mental Status: He is alert and oriented to person, place, and time.  Psychiatric:        Mood and Affect: Mood normal.        Behavior: Behavior normal.         Thought Content: Thought content normal.        Judgment: Judgment normal.      Assessment & Plan:   Problem List Items Addressed This Visit       Digestive   GERD (gastroesophageal reflux disease)    Chronic.  Continue omeprazole 40 mg daily.  Refills given today.       Relevant Medications   omeprazole (PRILOSEC) 40 MG capsule     Musculoskeletal and Integument   DDD (degenerative disc disease), lumbar (Chronic)    Chronic.  Stable with duloxetine 60 mg daily and Norco.  He is not yet due for refill of Norco.  Refills sent in for Cymbalta.  Pain management referral is pending.        Relevant Medications   celecoxib (CELEBREX) 200 MG capsule   DULoxetine (CYMBALTA) 60 MG capsule   DDD (degenerative disc disease), cervical (Chronic)  Chronic.  Stable with duloxetine 60 mg daily and Norco.  He is not yet due for refill of Norco.  Refills sent in for Cymbalta.  Pain management referral is pending.         Relevant Medications   celecoxib (CELEBREX) 200 MG capsule   DULoxetine (CYMBALTA) 60 MG capsule     Other   Hyperlipidemia    Chronic.  LDL goal less than 100.  Continue atorvastatin 80 mg daily; refills given today.      Relevant Medications   atorvastatin (LIPITOR) 80 MG tablet     Follow up plan: Return for with new PCP.

## 2021-10-19 NOTE — Assessment & Plan Note (Signed)
Chronic.  Stable with duloxetine 60 mg daily and Norco.  He is not yet due for refill of Norco.  Refills sent in for Cymbalta.  Pain management referral is pending.

## 2021-10-19 NOTE — Assessment & Plan Note (Signed)
Chronic.  Stable with duloxetine 60 mg daily and Norco.  He is not yet due for refill of Norco.  Refills sent in for Cymbalta.  Pain management referral is pending.   ° °

## 2021-10-19 NOTE — Assessment & Plan Note (Signed)
Chronic.  LDL goal less than 100.  Continue atorvastatin 80 mg daily; refills given today.

## 2021-10-19 NOTE — Assessment & Plan Note (Signed)
Chronic.  Continue omeprazole 40 mg daily.  Refills given today.

## 2021-11-06 ENCOUNTER — Ambulatory Visit: Payer: BC Managed Care – PPO | Admitting: Nurse Practitioner

## 2021-12-20 DIAGNOSIS — I10 Essential (primary) hypertension: Secondary | ICD-10-CM | POA: Diagnosis not present

## 2021-12-20 DIAGNOSIS — K219 Gastro-esophageal reflux disease without esophagitis: Secondary | ICD-10-CM | POA: Diagnosis not present

## 2021-12-20 DIAGNOSIS — E785 Hyperlipidemia, unspecified: Secondary | ICD-10-CM | POA: Diagnosis not present

## 2021-12-20 DIAGNOSIS — Z139 Encounter for screening, unspecified: Secondary | ICD-10-CM | POA: Diagnosis not present

## 2021-12-20 DIAGNOSIS — R7303 Prediabetes: Secondary | ICD-10-CM | POA: Diagnosis not present

## 2021-12-20 DIAGNOSIS — M519 Unspecified thoracic, thoracolumbar and lumbosacral intervertebral disc disorder: Secondary | ICD-10-CM | POA: Diagnosis not present

## 2022-01-02 ENCOUNTER — Encounter: Payer: Self-pay | Admitting: *Deleted

## 2022-01-15 DIAGNOSIS — Z125 Encounter for screening for malignant neoplasm of prostate: Secondary | ICD-10-CM | POA: Diagnosis not present

## 2022-01-15 DIAGNOSIS — I10 Essential (primary) hypertension: Secondary | ICD-10-CM | POA: Diagnosis not present

## 2022-01-15 DIAGNOSIS — E785 Hyperlipidemia, unspecified: Secondary | ICD-10-CM | POA: Diagnosis not present

## 2022-01-15 DIAGNOSIS — R7303 Prediabetes: Secondary | ICD-10-CM | POA: Diagnosis not present

## 2022-02-21 ENCOUNTER — Encounter: Payer: Self-pay | Admitting: *Deleted

## 2022-03-13 ENCOUNTER — Ambulatory Visit: Payer: BC Managed Care – PPO

## 2022-04-27 ENCOUNTER — Other Ambulatory Visit: Payer: Self-pay | Admitting: Nurse Practitioner

## 2022-04-27 DIAGNOSIS — K219 Gastro-esophageal reflux disease without esophagitis: Secondary | ICD-10-CM

## 2022-04-27 NOTE — Telephone Encounter (Signed)
Requested Prescriptions  Pending Prescriptions Disp Refills  . omeprazole (PRILOSEC) 40 MG capsule [Pharmacy Med Name: Omeprazole 40 MG Oral Capsule Delayed Release] 90 capsule 0    Sig: TAKE 1 CAPSULE BY MOUTH ONCE DAILY AS NEEDED FOR ABDOMINAL PAIN     Gastroenterology: Proton Pump Inhibitors Passed - 04/27/2022  2:41 PM      Passed - Valid encounter within last 12 months    Recent Outpatient Visits          6 months ago Gastroesophageal reflux disease, unspecified whether esophagitis present   [facility] Valentino Nose, NP   7 months ago Prediabetes   [facility] Valentino Nose, NP   9 months ago Black tarry stools   [facility] Valentino Nose, NP   11 months ago Pain, dental   [facility] Valentino Nose, NP   1 year ago Mixed hyperlipidemia   [facility] Valentino Nose, NP

## 2022-05-16 ENCOUNTER — Other Ambulatory Visit: Payer: Self-pay | Admitting: Nurse Practitioner

## 2022-05-16 DIAGNOSIS — K219 Gastro-esophageal reflux disease without esophagitis: Secondary | ICD-10-CM

## 2022-05-17 NOTE — Telephone Encounter (Signed)
No longer under provider care Requested Prescriptions  Pending Prescriptions Disp Refills  . omeprazole (PRILOSEC) 40 MG capsule [Pharmacy Med Name: Omeprazole 40 MG Oral Capsule Delayed Release] 90 capsule 0    Sig: TAKE 1 CAPSULE BY MOUTH ONCE DAILY AS NEEDED FOR ABDOMINAL PAIN     Gastroenterology: Proton Pump Inhibitors Passed - 05/16/2022  9:30 PM      Passed - Valid encounter within last 12 months    Recent Outpatient Visits          7 months ago Gastroesophageal reflux disease, unspecified whether esophagitis present   [facility] Valentino Nose, NP   8 months ago Prediabetes   [facility] Valentino Nose, NP   10 months ago Black tarry stools   [facility] Valentino Nose, NP   11 months ago Pain, dental   [facility] Valentino Nose, NP   1 year ago Mixed hyperlipidemia   [facility] Valentino Nose, NP

## 2022-05-23 DIAGNOSIS — M542 Cervicalgia: Secondary | ICD-10-CM | POA: Diagnosis not present

## 2022-07-11 DIAGNOSIS — Z125 Encounter for screening for malignant neoplasm of prostate: Secondary | ICD-10-CM | POA: Diagnosis not present

## 2022-07-11 DIAGNOSIS — I10 Essential (primary) hypertension: Secondary | ICD-10-CM | POA: Diagnosis not present

## 2022-07-11 DIAGNOSIS — R7303 Prediabetes: Secondary | ICD-10-CM | POA: Diagnosis not present

## 2022-07-23 DIAGNOSIS — I10 Essential (primary) hypertension: Secondary | ICD-10-CM | POA: Diagnosis not present

## 2022-07-23 DIAGNOSIS — E785 Hyperlipidemia, unspecified: Secondary | ICD-10-CM | POA: Diagnosis not present

## 2022-07-23 DIAGNOSIS — M542 Cervicalgia: Secondary | ICD-10-CM | POA: Diagnosis not present

## 2022-07-23 DIAGNOSIS — Z23 Encounter for immunization: Secondary | ICD-10-CM | POA: Diagnosis not present

## 2022-07-23 DIAGNOSIS — R7303 Prediabetes: Secondary | ICD-10-CM | POA: Diagnosis not present

## 2022-07-25 ENCOUNTER — Encounter: Payer: Self-pay | Admitting: *Deleted

## 2022-08-22 DIAGNOSIS — E785 Hyperlipidemia, unspecified: Secondary | ICD-10-CM | POA: Diagnosis not present

## 2022-08-22 DIAGNOSIS — I10 Essential (primary) hypertension: Secondary | ICD-10-CM | POA: Diagnosis not present

## 2022-08-22 DIAGNOSIS — M542 Cervicalgia: Secondary | ICD-10-CM | POA: Diagnosis not present

## 2022-09-25 DIAGNOSIS — F17210 Nicotine dependence, cigarettes, uncomplicated: Secondary | ICD-10-CM | POA: Diagnosis not present

## 2022-09-25 DIAGNOSIS — I10 Essential (primary) hypertension: Secondary | ICD-10-CM | POA: Diagnosis not present

## 2022-09-25 DIAGNOSIS — M542 Cervicalgia: Secondary | ICD-10-CM | POA: Diagnosis not present

## 2022-09-25 DIAGNOSIS — Z713 Dietary counseling and surveillance: Secondary | ICD-10-CM | POA: Diagnosis not present

## 2022-11-28 DIAGNOSIS — K219 Gastro-esophageal reflux disease without esophagitis: Secondary | ICD-10-CM | POA: Diagnosis not present

## 2022-11-28 DIAGNOSIS — F17210 Nicotine dependence, cigarettes, uncomplicated: Secondary | ICD-10-CM | POA: Diagnosis not present

## 2022-11-28 DIAGNOSIS — E785 Hyperlipidemia, unspecified: Secondary | ICD-10-CM | POA: Diagnosis not present

## 2022-11-28 DIAGNOSIS — I10 Essential (primary) hypertension: Secondary | ICD-10-CM | POA: Diagnosis not present

## 2022-12-03 ENCOUNTER — Encounter: Payer: Self-pay | Admitting: *Deleted

## 2023-01-22 ENCOUNTER — Encounter: Payer: Self-pay | Admitting: *Deleted

## 2023-01-24 DIAGNOSIS — I10 Essential (primary) hypertension: Secondary | ICD-10-CM | POA: Diagnosis not present

## 2023-01-24 DIAGNOSIS — R7303 Prediabetes: Secondary | ICD-10-CM | POA: Diagnosis not present

## 2023-02-08 ENCOUNTER — Other Ambulatory Visit (HOSPITAL_COMMUNITY): Payer: Self-pay | Admitting: Internal Medicine

## 2023-02-08 DIAGNOSIS — F17210 Nicotine dependence, cigarettes, uncomplicated: Secondary | ICD-10-CM | POA: Diagnosis not present

## 2023-02-08 DIAGNOSIS — K219 Gastro-esophageal reflux disease without esophagitis: Secondary | ICD-10-CM | POA: Diagnosis not present

## 2023-02-08 DIAGNOSIS — E785 Hyperlipidemia, unspecified: Secondary | ICD-10-CM | POA: Diagnosis not present

## 2023-02-08 DIAGNOSIS — I10 Essential (primary) hypertension: Secondary | ICD-10-CM | POA: Diagnosis not present

## 2023-02-08 DIAGNOSIS — R7303 Prediabetes: Secondary | ICD-10-CM | POA: Diagnosis not present

## 2023-02-15 ENCOUNTER — Ambulatory Visit (HOSPITAL_COMMUNITY): Payer: BC Managed Care – PPO

## 2023-04-05 DIAGNOSIS — F331 Major depressive disorder, recurrent, moderate: Secondary | ICD-10-CM | POA: Diagnosis not present

## 2023-04-12 ENCOUNTER — Encounter (HOSPITAL_COMMUNITY): Payer: Self-pay

## 2023-04-12 ENCOUNTER — Ambulatory Visit (HOSPITAL_COMMUNITY): Admission: RE | Admit: 2023-04-12 | Payer: BC Managed Care – PPO | Source: Ambulatory Visit

## 2023-05-24 ENCOUNTER — Encounter: Payer: Self-pay | Admitting: *Deleted

## 2023-06-07 DIAGNOSIS — R42 Dizziness and giddiness: Secondary | ICD-10-CM | POA: Diagnosis not present

## 2023-06-07 DIAGNOSIS — F418 Other specified anxiety disorders: Secondary | ICD-10-CM | POA: Diagnosis not present

## 2023-06-07 DIAGNOSIS — M25511 Pain in right shoulder: Secondary | ICD-10-CM | POA: Diagnosis not present

## 2023-06-07 DIAGNOSIS — M542 Cervicalgia: Secondary | ICD-10-CM | POA: Diagnosis not present

## 2023-08-12 DIAGNOSIS — I10 Essential (primary) hypertension: Secondary | ICD-10-CM | POA: Diagnosis not present

## 2023-08-12 DIAGNOSIS — R7303 Prediabetes: Secondary | ICD-10-CM | POA: Diagnosis not present

## 2023-08-12 DIAGNOSIS — Z125 Encounter for screening for malignant neoplasm of prostate: Secondary | ICD-10-CM | POA: Diagnosis not present

## 2023-08-23 ENCOUNTER — Other Ambulatory Visit (HOSPITAL_COMMUNITY): Payer: Self-pay | Admitting: Internal Medicine

## 2023-08-23 DIAGNOSIS — F17200 Nicotine dependence, unspecified, uncomplicated: Secondary | ICD-10-CM

## 2023-08-23 DIAGNOSIS — Z23 Encounter for immunization: Secondary | ICD-10-CM | POA: Diagnosis not present

## 2023-08-23 DIAGNOSIS — Z0001 Encounter for general adult medical examination with abnormal findings: Secondary | ICD-10-CM | POA: Diagnosis not present

## 2023-08-23 DIAGNOSIS — K219 Gastro-esophageal reflux disease without esophagitis: Secondary | ICD-10-CM | POA: Diagnosis not present

## 2023-08-23 DIAGNOSIS — I10 Essential (primary) hypertension: Secondary | ICD-10-CM | POA: Diagnosis not present

## 2023-08-23 DIAGNOSIS — E785 Hyperlipidemia, unspecified: Secondary | ICD-10-CM | POA: Diagnosis not present

## 2023-08-23 DIAGNOSIS — M25511 Pain in right shoulder: Secondary | ICD-10-CM | POA: Diagnosis not present

## 2023-08-23 DIAGNOSIS — F17210 Nicotine dependence, cigarettes, uncomplicated: Secondary | ICD-10-CM | POA: Diagnosis not present

## 2023-08-23 DIAGNOSIS — F331 Major depressive disorder, recurrent, moderate: Secondary | ICD-10-CM | POA: Diagnosis not present

## 2023-08-28 ENCOUNTER — Encounter: Payer: Self-pay | Admitting: *Deleted

## 2023-09-06 ENCOUNTER — Ambulatory Visit: Payer: BC Managed Care – PPO | Admitting: Surgical

## 2023-09-07 ENCOUNTER — Ambulatory Visit (HOSPITAL_COMMUNITY)
Admission: RE | Admit: 2023-09-07 | Discharge: 2023-09-07 | Disposition: A | Discharge status: Home / Self Care | Payer: BC Managed Care – PPO | Source: Ambulatory Visit | Attending: Internal Medicine | Admitting: Internal Medicine

## 2023-09-07 DIAGNOSIS — F17200 Nicotine dependence, unspecified, uncomplicated: Secondary | ICD-10-CM | POA: Insufficient documentation

## 2023-09-07 DIAGNOSIS — F17210 Nicotine dependence, cigarettes, uncomplicated: Secondary | ICD-10-CM | POA: Diagnosis not present

## 2023-09-24 ENCOUNTER — Telehealth: Payer: Self-pay | Admitting: Radiology

## 2023-09-24 ENCOUNTER — Ambulatory Visit: Payer: BC Managed Care – PPO | Admitting: Orthopaedic Surgery

## 2023-09-24 ENCOUNTER — Other Ambulatory Visit (INDEPENDENT_AMBULATORY_CARE_PROVIDER_SITE_OTHER): Payer: BC Managed Care – PPO

## 2023-09-24 DIAGNOSIS — M25511 Pain in right shoulder: Secondary | ICD-10-CM

## 2023-09-24 DIAGNOSIS — G8929 Other chronic pain: Secondary | ICD-10-CM | POA: Diagnosis not present

## 2023-09-24 NOTE — Progress Notes (Signed)
 Office Visit Note   Patient: Tyler Craig           Date of Birth: 20-Mar-1971           MRN: [id number] Visit Date: 09/24/2023              Requested by: Tyler Norleen PEDLAR, MD Tyler Craig. Tyler Craig,  Tyler Craig [id number] PCP: Tyler Norleen PEDLAR, MD   Assessment & Plan: Visit Diagnoses:  1. Chronic right shoulder pain     Plan: Patient is a very pleasant 53 year old gentleman with right shoulder pain.  Impression is glenohumeral osteoarthritis.  Previous subacromial injection was not effective but I suggested an ultrasound-guided glenohumeral injection with Tyler Craig as his symptoms seem to correlate with symptomatic glenohumeral arthritis.  We will get him set up for this.  Continue with over-the-counter medications as needed.  Follow-up if symptoms not improve after 6 weeks.  Follow-Up Instructions: No follow-ups on file.   Orders:  Orders Placed This Encounter  Procedures   XR Shoulder Right   No orders of the defined types were placed in this encounter.     Procedures: No procedures performed   Clinical Data: No additional findings.   Subjective: Chief Complaint  Patient presents with   Right Shoulder - Pain    HPI Tyler Craig is a very pleasant 53 year old gentleman here for evaluation of right shoulder pain since 2020.  Saw [name] at that time and was given a subacromial injection which did not help.  Due to COVID he was lost to follow-up.  He comes back today for further evaluation and treatment.  No recent injuries. Review of Systems  Constitutional: Negative.   HENT: Negative.    Eyes: Negative.   Respiratory: Negative.    Cardiovascular: Negative.   Gastrointestinal: Negative.   Endocrine: Negative.   Genitourinary: Negative.   Skin: Negative.   Allergic/Immunologic: Negative.   Neurological: Negative.   Hematological: Negative.   Psychiatric/Behavioral: Negative.    All other systems reviewed and are negative.    Objective: Vital Signs: There were  no vitals taken for this visit.  Physical Exam Vitals and nursing note reviewed.  Constitutional:      Appearance: He is well-developed.  HENT:     Head: Normocephalic and atraumatic.  Eyes:     Pupils: Pupils are equal, round, and reactive to light.  Pulmonary:     Effort: Pulmonary effort is normal.  Abdominal:     Palpations: Abdomen is soft.  Musculoskeletal:        General: Normal range of motion.     Cervical back: Neck supple.  Skin:    General: Skin is warm.  Neurological:     Mental Status: He is alert and oriented to person, place, and time.  Psychiatric:        Behavior: Behavior normal.        Thought Content: Thought content normal.        Judgment: Judgment normal.     Ortho Exam Examination of the right shoulder shows pain with glenohumeral motion with mild crepitus.  Manual muscle testing the rotator cuff is grossly intact. Specialty Comments:  No specialty comments available.  Imaging: XR Shoulder Right Result Date: 09/24/2023 X-rays of the right shoulder show large inferior humeral spur not present on previous x-ray from 4 years ago.  There is greater than 50% glenohumeral joint space narrowing.    PMFS History: Patient Active Problem List   Diagnosis Date Noted  Abnormal drug screen (03/15/2021) 05/17/2021   Marijuana use 05/17/2021   Chronic pain syndrome 03/15/2021   Pharmacologic therapy 03/15/2021   Disorder of skeletal system 03/15/2021   Problems influencing health status 03/15/2021   Abnormal MRI, lumbar spine (11/28/2016) 03/15/2021   Thoracic facet arthropathy 03/15/2021   Lumbosacral facet arthropathy 03/15/2021   Thoracic foraminal stenosis 03/15/2021   Lumbosacral foraminal stenosis 03/15/2021   Lumbosacral lateral recess stenosis 03/15/2021   Abnormal MRI, cervical spine (11/28/2016) 03/15/2021   Cervicalgia 03/15/2021   Chronic neck pain with history of cervical spinal surgery (1ry area of Pain) 03/15/2021   Chronic shoulder pain  (Right) 03/15/2021   Chronic low back pain (Bilateral) w/o sciatica 03/15/2021   Chronic upper extremity pain (Bilateral) 03/15/2021   Chronic lower extremity pain (Left) 03/15/2021   Radicular pain of shoulder (Right) 03/15/2021   Chronic cervical radiculopathy (C6/C7) (Right) 03/15/2021   Chronic lumbar radiculitis (L5) (Left) 03/15/2021   Numbness and tingling of upper extremity (Left) 03/15/2021   Numbness and tingling of lower extremity (Left) 03/15/2021   Prediabetes 01/26/2021   Rash 12/27/2020   Nausea with vomiting 06/09/2020   GERD (gastroesophageal reflux disease) 09/30/2019   Abdominal pain 09/30/2019   Lumbar nerve root impingement (S1) (Right) 04/21/2018   History of cervical spinal surgery (C4-6 ACDF) 04/04/2017   Cervical spinal stenosis 02/04/2017   DDD (degenerative disc disease), cervical 12/12/2016   DDD (degenerative disc disease), lumbar 12/12/2016   Cervical nerve root impingement 12/12/2016   Benign paroxysmal positional vertigo 08/03/2016   Hyperlipidemia 11/16/2014   Insomnia 07/05/2014   Elbow pain 02/09/2014   MDD (major depressive disorder) 12/29/2013   GAD (generalized anxiety disorder) 12/29/2013   Smoker 12/29/2013   Past Medical History:  Diagnosis Date   Anxiety    takes Fluoxetine  daily   Hyperlipidemia    takes Atorvastatin  daily   Muscle spasm    takes Flexeril  as needed   Numbness    left arm and left leg.Tingling as well   PONV (postoperative nausea and vomiting)     Family History  Problem Relation Age of Onset   Hypertension Mother    Colon cancer Father 48   Heart disease Maternal Grandfather    Heart disease Maternal Uncle 29       massive MI    Past Surgical History:  Procedure Laterality Date   ANTERIOR CERVICAL DECOMP/DISCECTOMY FUSION N/A 02/04/2017   Procedure: C4-5, C5-6 Anterior Cervical Discectomy and Fusion, Allograft and Plate at R5-4 and Zero P at C5-6;  Surgeon: Tyler Oneil BROCKS, MD;  Location: [facility] OR;  Service:  Orthopedics;  Laterality: N/A;   EYE SURGERY     left eye artificial lens placed   HERNIA REPAIR     SPINE SURGERY     neck surgery- has plate   Social History   Occupational History   Not on file  Tobacco Use   Smoking status: Every Day    Current packs/day: 1.00    Average packs/day: 1 pack/day for 44.0 years (44.0 ttl pk-yrs)    Types: Cigarettes   Smokeless tobacco: Never  Vaping Use   Vaping status: Never Used  Substance and Sexual Activity   Alcohol use: Not Currently    Comment: occasionally   Drug use: Yes    Types: Marijuana    Comment: uses at night   Sexual activity: Yes

## 2023-09-24 NOTE — Telephone Encounter (Signed)
 Please call to schedule Right shoulder injection with Dr. Richarda Osmond patient.

## 2023-10-04 ENCOUNTER — Ambulatory Visit: Payer: BC Managed Care – PPO | Admitting: Sports Medicine

## 2023-10-15 ENCOUNTER — Encounter: Payer: Self-pay | Admitting: Sports Medicine

## 2023-10-15 ENCOUNTER — Other Ambulatory Visit: Payer: Self-pay

## 2023-10-15 ENCOUNTER — Ambulatory Visit (INDEPENDENT_AMBULATORY_CARE_PROVIDER_SITE_OTHER): Payer: BC Managed Care – PPO | Admitting: Sports Medicine

## 2023-10-15 DIAGNOSIS — M19011 Primary osteoarthritis, right shoulder: Secondary | ICD-10-CM

## 2023-10-15 DIAGNOSIS — M25511 Pain in right shoulder: Secondary | ICD-10-CM | POA: Diagnosis not present

## 2023-10-15 DIAGNOSIS — G8929 Other chronic pain: Secondary | ICD-10-CM | POA: Diagnosis not present

## 2023-10-15 MED ORDER — METHYLPREDNISOLONE ACETATE 40 MG/ML IJ SUSP
40.0000 mg | INTRAMUSCULAR | Status: AC | PRN
  Administered 2023-10-15: 40 mg via INTRA_ARTICULAR

## 2023-10-15 MED ORDER — BUPIVACAINE HCL 0.25 % IJ SOLN
2.0000 mL | INTRAMUSCULAR | Status: AC | PRN
  Administered 2023-10-15: 2 mL via INTRA_ARTICULAR

## 2023-10-15 MED ORDER — LIDOCAINE HCL 1 % IJ SOLN
2.0000 mL | INTRAMUSCULAR | Status: AC | PRN
  Administered 2023-10-15: 2 mL

## 2023-10-15 NOTE — Progress Notes (Signed)
   Procedure Note  Patient: Tyler Craig             Date of Birth: [birth year]           MRN: [id number]             Visit Date: 10/15/2023  Procedures: Visit Diagnoses:  1. Chronic right shoulder pain   2. Primary osteoarthritis, right shoulder    Large Joint Inj: R glenohumeral on 10/15/2023 9:42 AM Indications: pain Details: 22 G 3.5 in needle, ultrasound-guided posterior approach Medications: 2 mL lidocaine 1 %; 2 mL bupivacaine 0.25 %; 40 mg methylPREDNISolone acetate 40 MG/ML Outcome: tolerated well, no immediate complications  US-guided glenohumeral joint injection, right shoulder After discussion on risks/benefits/indications, informed verbal consent was obtained. A timeout was then performed. The patient was positioned lying lateral recumbent on examination table. The patient's shoulder was prepped with betadine and multiple alcohol swabs and utilizing ultrasound guidance, the patient's glenohumeral joint was identified on ultrasound. Using ultrasound guidance a 22-gauge, 3.5 inch needle with a mixture of 2:2:2 cc's lidocaine:bupivicaine:depomedrol was directed from a lateral to medial direction via in-plane technique into the glenohumeral joint with visualization of appropriate spread of injectate into the joint. Patient tolerated the procedure well without immediate complications.      Procedure, treatment alternatives, risks and benefits explained, specific risks discussed. Consent was given by the patient. Immediately prior to procedure a time out was called to verify the correct patient, procedure, equipment, support staff and site/side marked as required. Patient was prepped and draped in the usual sterile fashion.     - follow-up with Dr. Roda Shutters as indicated; I am happy to see them as needed  Madelyn Brunner, DO Primary Care Sports Medicine Physician  [facility] - Orthopedics  This note was dictated using Dragon naturally speaking software and may contain errors  in syntax, spelling, or content which have not been identified prior to signing this note.

## 2023-11-12 ENCOUNTER — Ambulatory Visit: Payer: BC Managed Care – PPO | Admitting: Sports Medicine

## 2023-11-22 DIAGNOSIS — F17210 Nicotine dependence, cigarettes, uncomplicated: Secondary | ICD-10-CM | POA: Diagnosis not present

## 2023-11-22 DIAGNOSIS — I10 Essential (primary) hypertension: Secondary | ICD-10-CM | POA: Diagnosis not present

## 2023-11-22 DIAGNOSIS — M19011 Primary osteoarthritis, right shoulder: Secondary | ICD-10-CM | POA: Diagnosis not present

## 2023-11-22 DIAGNOSIS — I700 Atherosclerosis of aorta: Secondary | ICD-10-CM | POA: Diagnosis not present

## 2023-11-22 DIAGNOSIS — G894 Chronic pain syndrome: Secondary | ICD-10-CM | POA: Diagnosis not present

## 2023-12-18 ENCOUNTER — Encounter: Payer: Self-pay | Admitting: Internal Medicine

## 2023-12-18 ENCOUNTER — Ambulatory Visit: Payer: BC Managed Care – PPO | Attending: Internal Medicine | Admitting: Internal Medicine

## 2023-12-18 VITALS — BP 118/72 | HR 101 | Ht 70.0 in | Wt 178.0 lb

## 2023-12-18 DIAGNOSIS — I2510 Atherosclerotic heart disease of native coronary artery without angina pectoris: Secondary | ICD-10-CM | POA: Diagnosis not present

## 2023-12-18 DIAGNOSIS — I10 Essential (primary) hypertension: Secondary | ICD-10-CM | POA: Diagnosis not present

## 2023-12-18 DIAGNOSIS — R079 Chest pain, unspecified: Secondary | ICD-10-CM | POA: Insufficient documentation

## 2023-12-18 MED ORDER — ASPIRIN 81 MG PO TBEC: 81.0000 mg | DELAYED_RELEASE_TABLET | Freq: Every day | ORAL | Status: AC

## 2023-12-18 NOTE — Patient Instructions (Signed)
 Medication Instructions:  Your physician has recommended you make the following change in your medication:   -Start Aspirin 81 mg once daily  -Stop Celecoxib   *If you need a refill on your cardiac medications before your next appointment, please call your pharmacy*  Lab Work: None If you have labs (blood work) drawn today and your tests are completely normal, you will receive your results only by: MyChart Message (if you have MyChart) OR A paper copy in the mail If you have any lab test that is abnormal or we need to change your treatment, we will call you to review the results.  Testing/Procedures: Your physician has requested that you have an echocardiogram. Echocardiography is a painless test that uses sound waves to create images of your heart. It provides your doctor with information about the size and shape of your heart and how well your heart's chambers and valves are working. This procedure takes approximately one hour. There are no restrictions for this procedure. Please do NOT wear cologne, perfume, aftershave, or lotions (deodorant is allowed). Please arrive 15 minutes prior to your appointment time.  Please note: We ask at that you not bring children with you during ultrasound (echo/ vascular) testing. Due to room size and safety concerns, children are not allowed in the ultrasound rooms during exams. Our front office staff cannot provide observation of children in our lobby area while testing is being conducted. An adult accompanying a patient to their appointment will only be allowed in the ultrasound room at the discretion of the ultrasound technician under special circumstances. We apologize for any inconvenience.  Your physician has requested that you have a lexiscan myoview. For further information please visit [email or web address] Please follow instruction sheet, as given.   Follow-Up: At [facility], you and your health needs are our priority.  As part of  our continuing mission to provide you with exceptional heart care, our providers are all part of one team.  This team includes your primary Cardiologist (physician) and Advanced Practice Providers or APPs (Physician Assistants and Nurse Practitioners) who all work together to provide you with the care you need, when you need it.  Your next appointment:   1 year(s)  Provider:   You may see Luane School, MD or one of the following Advanced Practice Providers on your designated Care Team:   [place], PA-C  [place], [place] Jacolyn Reedy, [place]     We recommend signing up for the patient portal called "MyChart".  Sign up information is provided on this After Visit Summary.  MyChart is used to connect with patients for Virtual Visits (Telemedicine).  Patients are able to view lab/test results, encounter notes, upcoming appointments, etc.  Non-urgent messages can be sent to your provider as well.   To learn more about what you can do with MyChart, go to [email or web address].   Other Instructions

## 2023-12-18 NOTE — Progress Notes (Signed)
 Cardiology Office Note  Date: 12/18/2023   ID: Tyler Craig, DOB 19-Apr-1971, MRN [id number], 53, DOB 19-Apr-1971, MRN [id number]  PCP:  Benita Stabile, MD  Cardiologist:  None Electrophysiologist:  None   History of Present Illness: Tyler Craig is a 53 y.o. male with no PMH was referred to cardiology clinic for evaluation of coronary artery atherosclerosis on CT chest.  Patient has a lot of anxiety.  He gets chest tightness when he is anxious, nervous and when he is upset.  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  Does not recall the duration of chest tightness.  It resolves with rest.  Is here with some SOB.  Otherwise no other symptoms of dizziness, syncope, palpitations, leg swelling.  Smokes cigarettes.  Past Medical History:  Diagnosis Date   Anxiety    takes Fluoxetine daily   Hyperlipidemia    takes Atorvastatin daily   Muscle spasm    takes Flexeril as needed   Numbness    left arm and left leg.Tingling as well   PONV (postoperative nausea and vomiting)     Past Surgical History:  Procedure Laterality Date   ANTERIOR CERVICAL DECOMP/DISCECTOMY FUSION N/A 02/04/2017   Procedure: C4-5, C5-6 Anterior Cervical Discectomy and Fusion, Allograft and Plate at W0-9 and Zero P at C5-6;  Surgeon: Eldred Manges, MD;  Location: [facility] OR;  Service: Orthopedics;  Laterality: N/A;   EYE SURGERY     left eye artificial lens placed   HERNIA REPAIR     SPINE SURGERY     neck surgery- has plate    Current Outpatient Medications  Medication Sig Dispense Refill   atorvastatin (LIPITOR) 80 MG tablet Take 80mg  once a day 90 tablet 1   buPROPion (WELLBUTRIN XL) 150 MG 24 hr tablet Take 150 mg by mouth daily.     calcium carbonate (TUMS - DOSED IN MG ELEMENTAL CALCIUM) 500 MG chewable tablet Chew 3 tablets by mouth 2 (two) times daily as needed for indigestion or heartburn.     celecoxib (CELEBREX) 200 MG capsule Take 200mg  once a day for inflammation 90 capsule 1   DULoxetine (CYMBALTA) 60 MG  capsule Take 1 capsule (60 mg total) by mouth daily. 90 capsule 1   HYDROcodone-acetaminophen (NORCO) 5-325 MG tablet Take 1 tablet by mouth every 6 (six) hours as needed for moderate pain or severe pain. Dx: G89.4, G54.2 60 tablet 0   lisinopril-hydrochlorothiazide (ZESTORETIC) 10-12.5 MG tablet Take 1 tablet by mouth daily. Takes 1/2 tablet daily     omeprazole (PRILOSEC) 40 MG capsule Take 1 capsule (40 mg total) by mouth daily as needed. TAKE 1 CAPSULE(40 MG) BY MOUTH DAILY FOR ABDOMINAL PAIN 90 capsule 1   No current facility-administered medications for this visit.   Allergies:  Amoxicillin   Social History: The patient  reports that he has been smoking cigarettes. He has a 44 pack-year smoking history. He has never used smokeless tobacco. He reports that he does not currently use alcohol. He reports current drug use. Drug: Marijuana.   Family History: The patient's family history includes Colon cancer (age of onset: 61) in his father; Heart disease in his maternal grandfather; Heart disease (age of onset: 17) in his maternal uncle; Hypertension in his mother.   ROS:  Please see the history of present illness. Otherwise, complete review of systems is positive for none.  All other systems are reviewed and negative.   Physical Exam: VS:  BP 118/72 (BP Location: Left Arm,  Patient Position: Sitting, Cuff Size: Normal)   Pulse (!) 101   Ht 5\' 10"  (1.778 m)   Wt 178 lb (80.7 kg)   SpO2 98%   BMI 25.54 kg/m , BMI Body mass index is 25.54 kg/m.  Wt Readings from Last 3 Encounters:  12/18/23 178 lb (80.7 kg)  10/19/21 184 lb (83.5 kg)  09/04/21 182 lb (82.6 kg)    General: Patient appears comfortable at rest. HEENT: Conjunctiva and lids normal, oropharynx clear with moist mucosa. Neck: Supple, no elevated JVP or carotid bruits, no thyromegaly. Lungs: Clear to auscultation, nonlabored breathing at rest. Cardiac: Regular rate and rhythm, no S3 or significant systolic murmur, no  pericardial rub. Abdomen: Soft, nontender, no hepatomegaly, bowel sounds present, no guarding or rebound. Extremities: No pitting edema, distal pulses 2+. Skin: Warm and dry. Musculoskeletal: No kyphosis. Neuropsychiatric: Alert and oriented x3, affect grossly appropriate.  Recent Labwork: No results found for requested labs within last 365 days.     Component Value Date/Time   CHOL 158 09/04/2021 1639   TRIG 124 09/04/2021 1639   HDL 34 (L) 09/04/2021 1639   CHOLHDL 4.6 09/04/2021 1639   VLDL 31 (H) 12/20/2015 1303   LDLCALC 101 (H) 09/04/2021 1639     Assessment and Plan:   Imaging evidence of coronary calcifications: Mixed type of chest pains, worse with anxiety, stress, when he gets upset.  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  Due to imaging evidence of multivessel coronary calcifications, will obtain echocardiogram and Lexiscan.  He has neck issues with cervical radiculopathy, does not benefit from treadmill.  Will start him on aspirin 81 mg once daily and discontinue celecoxib.  Hyperlipidemia: LDL 101 in 2022.  Need to repeat lipid panel.  Continue atorvastatin 80 mg at bedtime.  Goal LDL less than 70.  HTN, controlled: Continue current antihypertensives, lisinopril-HCTZ 10-12.5 mg once daily.  Follow-up with PCP.   Medication Adjustments/Labs and Tests Ordered: Current medicines are reviewed at length with the patient today.  Concerns regarding medicines are outlined above.    Disposition:  Follow up 1 year  Signed, Callahan Wild Verne Spurr, MD, 12/18/2023 1:07 PM    [facility] [address] [place], [place] [address]

## 2023-12-20 ENCOUNTER — Ambulatory Visit: Payer: BC Managed Care – PPO | Admitting: Internal Medicine

## 2024-01-14 ENCOUNTER — Telehealth: Payer: Self-pay

## 2024-01-14 ENCOUNTER — Ambulatory Visit (HOSPITAL_COMMUNITY): Admission: RE | Admit: 2024-01-14 | Source: Ambulatory Visit

## 2024-01-14 ENCOUNTER — Encounter (HOSPITAL_COMMUNITY)

## 2024-01-14 NOTE — Telephone Encounter (Signed)
-----   Message from [employer] [place] B sent at 12/19/2023  3:30 PM EDT ----- Regarding: Echo/Lexi 4/29- VM

## 2024-01-14 NOTE — Telephone Encounter (Signed)
 Echo/Lexi canceled by patient due to cost. Note entered in as FYI

## 2024-02-24 DIAGNOSIS — I10 Essential (primary) hypertension: Secondary | ICD-10-CM | POA: Diagnosis not present

## 2024-02-24 DIAGNOSIS — R7303 Prediabetes: Secondary | ICD-10-CM | POA: Diagnosis not present

## 2024-02-28 ENCOUNTER — Encounter (INDEPENDENT_AMBULATORY_CARE_PROVIDER_SITE_OTHER): Payer: Self-pay | Admitting: *Deleted

## 2024-02-28 DIAGNOSIS — I10 Essential (primary) hypertension: Secondary | ICD-10-CM | POA: Diagnosis not present

## 2024-02-28 DIAGNOSIS — M19011 Primary osteoarthritis, right shoulder: Secondary | ICD-10-CM | POA: Diagnosis not present

## 2024-02-28 DIAGNOSIS — G894 Chronic pain syndrome: Secondary | ICD-10-CM | POA: Diagnosis not present

## 2024-02-28 DIAGNOSIS — F17210 Nicotine dependence, cigarettes, uncomplicated: Secondary | ICD-10-CM | POA: Diagnosis not present

## 2024-05-04 ENCOUNTER — Other Ambulatory Visit (HOSPITAL_COMMUNITY): Payer: Self-pay | Admitting: Family Medicine

## 2024-05-04 DIAGNOSIS — M5440 Lumbago with sciatica, unspecified side: Secondary | ICD-10-CM

## 2024-05-04 DIAGNOSIS — G8929 Other chronic pain: Secondary | ICD-10-CM

## 2024-05-11 ENCOUNTER — Ambulatory Visit (HOSPITAL_COMMUNITY)
Admission: RE | Admit: 2024-05-11 | Discharge: 2024-05-11 | Disposition: A | Discharge status: Home / Self Care | Source: Ambulatory Visit | Attending: Family Medicine | Admitting: Family Medicine

## 2024-05-11 DIAGNOSIS — M5440 Lumbago with sciatica, unspecified side: Secondary | ICD-10-CM | POA: Insufficient documentation

## 2024-05-11 DIAGNOSIS — M5441 Lumbago with sciatica, right side: Secondary | ICD-10-CM | POA: Diagnosis not present

## 2024-05-11 DIAGNOSIS — G8929 Other chronic pain: Secondary | ICD-10-CM

## 2024-06-01 DIAGNOSIS — M2560 Stiffness of unspecified joint, not elsewhere classified: Secondary | ICD-10-CM | POA: Diagnosis not present

## 2024-06-01 DIAGNOSIS — M6281 Muscle weakness (generalized): Secondary | ICD-10-CM | POA: Diagnosis not present

## 2024-06-01 DIAGNOSIS — G894 Chronic pain syndrome: Secondary | ICD-10-CM | POA: Diagnosis not present

## 2024-06-01 DIAGNOSIS — Z23 Encounter for immunization: Secondary | ICD-10-CM | POA: Diagnosis not present

## 2024-06-01 DIAGNOSIS — M19011 Primary osteoarthritis, right shoulder: Secondary | ICD-10-CM | POA: Diagnosis not present

## 2024-06-01 DIAGNOSIS — I10 Essential (primary) hypertension: Secondary | ICD-10-CM | POA: Diagnosis not present

## 2024-06-01 DIAGNOSIS — M5416 Radiculopathy, lumbar region: Secondary | ICD-10-CM | POA: Diagnosis not present

## 2024-06-01 DIAGNOSIS — R2689 Other abnormalities of gait and mobility: Secondary | ICD-10-CM | POA: Diagnosis not present

## 2024-06-09 DIAGNOSIS — M5416 Radiculopathy, lumbar region: Secondary | ICD-10-CM | POA: Diagnosis not present

## 2024-06-17 DIAGNOSIS — M6281 Muscle weakness (generalized): Secondary | ICD-10-CM | POA: Diagnosis not present

## 2024-06-17 DIAGNOSIS — R2689 Other abnormalities of gait and mobility: Secondary | ICD-10-CM | POA: Diagnosis not present

## 2024-06-17 DIAGNOSIS — M2560 Stiffness of unspecified joint, not elsewhere classified: Secondary | ICD-10-CM | POA: Diagnosis not present

## 2024-06-17 DIAGNOSIS — M5416 Radiculopathy, lumbar region: Secondary | ICD-10-CM | POA: Diagnosis not present

## 2024-07-20 ENCOUNTER — Encounter: Payer: Self-pay | Admitting: Radiology

## 2024-08-07 DIAGNOSIS — M5116 Intervertebral disc disorders with radiculopathy, lumbar region: Secondary | ICD-10-CM | POA: Diagnosis not present

## 2024-08-07 DIAGNOSIS — M48061 Spinal stenosis, lumbar region without neurogenic claudication: Secondary | ICD-10-CM | POA: Diagnosis not present

## 2024-08-07 DIAGNOSIS — M5416 Radiculopathy, lumbar region: Secondary | ICD-10-CM | POA: Diagnosis not present
